# Patient Record
Sex: Male | Born: 1970 | Race: Black or African American | Hispanic: No | Marital: Married | Smoking: Current every day smoker
Health system: Southern US, Community
[De-identification: ages and names within clinical notes are randomized; demographics above are authoritative.]

## PROBLEM LIST (undated history)

## (undated) DIAGNOSIS — K219 Gastro-esophageal reflux disease without esophagitis: Secondary | ICD-10-CM

## (undated) DIAGNOSIS — I10 Essential (primary) hypertension: Secondary | ICD-10-CM

## (undated) DIAGNOSIS — L709 Acne, unspecified: Secondary | ICD-10-CM

## (undated) DIAGNOSIS — T7840XA Allergy, unspecified, initial encounter: Secondary | ICD-10-CM

## (undated) DIAGNOSIS — M1990 Unspecified osteoarthritis, unspecified site: Secondary | ICD-10-CM

## (undated) DIAGNOSIS — R011 Cardiac murmur, unspecified: Secondary | ICD-10-CM

## (undated) HISTORY — DX: Cardiac murmur, unspecified: R01.1

## (undated) HISTORY — PX: CATARACT EXTRACTION: SUR2

## (undated) HISTORY — PX: EYE SURGERY: SHX253

## (undated) HISTORY — DX: Gastro-esophageal reflux disease without esophagitis: K21.9

## (undated) HISTORY — DX: Acne, unspecified: L70.9

## (undated) HISTORY — DX: Essential (primary) hypertension: I10

## (undated) HISTORY — DX: Allergy, unspecified, initial encounter: T78.40XA

## (undated) HISTORY — DX: Unspecified osteoarthritis, unspecified site: M19.90

---

## 2000-12-16 ENCOUNTER — Encounter: Payer: Self-pay | Admitting: Emergency Medicine

## 2000-12-16 ENCOUNTER — Emergency Department (HOSPITAL_COMMUNITY): Admission: EM | Admit: 2000-12-16 | Discharge: 2000-12-16 | Payer: Self-pay | Admitting: Emergency Medicine

## 2008-03-27 ENCOUNTER — Ambulatory Visit: Payer: Self-pay | Admitting: Internal Medicine

## 2008-03-27 DIAGNOSIS — R011 Cardiac murmur, unspecified: Secondary | ICD-10-CM | POA: Insufficient documentation

## 2008-03-27 DIAGNOSIS — IMO0001 Reserved for inherently not codable concepts without codable children: Secondary | ICD-10-CM | POA: Insufficient documentation

## 2008-03-27 DIAGNOSIS — E1165 Type 2 diabetes mellitus with hyperglycemia: Secondary | ICD-10-CM

## 2008-03-27 DIAGNOSIS — K219 Gastro-esophageal reflux disease without esophagitis: Secondary | ICD-10-CM | POA: Insufficient documentation

## 2008-04-05 ENCOUNTER — Telehealth: Payer: Self-pay | Admitting: Internal Medicine

## 2008-05-29 ENCOUNTER — Ambulatory Visit: Payer: Self-pay | Admitting: Internal Medicine

## 2008-05-29 LAB — CONVERTED CEMR LAB: Hgb A1c MFr Bld: 7.9 % — ABNORMAL HIGH (ref 4.6–6.0)

## 2008-06-06 ENCOUNTER — Ambulatory Visit: Payer: Self-pay | Admitting: Internal Medicine

## 2008-06-15 ENCOUNTER — Telehealth: Payer: Self-pay | Admitting: Family Medicine

## 2008-08-08 ENCOUNTER — Ambulatory Visit: Payer: Self-pay | Admitting: Internal Medicine

## 2008-08-08 LAB — CONVERTED CEMR LAB
ALT: 16 units/L (ref 0–53)
AST: 17 units/L (ref 0–37)
Albumin: 4.4 g/dL (ref 3.5–5.2)
Alkaline Phosphatase: 61 units/L (ref 39–117)
BUN: 11 mg/dL (ref 6–23)
Bilirubin, Direct: 0.2 mg/dL (ref 0.0–0.3)
CO2: 31 meq/L (ref 19–32)
Calcium: 9.6 mg/dL (ref 8.4–10.5)
Chloride: 101 meq/L (ref 96–112)
Creatinine, Ser: 0.9 mg/dL (ref 0.4–1.5)
GFR calc Af Amer: 121 mL/min
GFR calc non Af Amer: 100 mL/min
Glucose, Bld: 164 mg/dL — ABNORMAL HIGH (ref 70–99)
Hgb A1c MFr Bld: 8.8 % — ABNORMAL HIGH (ref 4.6–6.0)
Potassium: 4.2 meq/L (ref 3.5–5.1)
Sodium: 142 meq/L (ref 135–145)
Total Bilirubin: 1 mg/dL (ref 0.3–1.2)
Total Protein: 7.4 g/dL (ref 6.0–8.3)

## 2008-09-11 ENCOUNTER — Ambulatory Visit: Payer: Self-pay | Admitting: Internal Medicine

## 2008-11-14 ENCOUNTER — Ambulatory Visit: Payer: Self-pay | Admitting: Internal Medicine

## 2008-11-14 LAB — CONVERTED CEMR LAB
ALT: 16 units/L (ref 0–53)
AST: 21 units/L (ref 0–37)
Albumin: 4 g/dL (ref 3.5–5.2)
Alkaline Phosphatase: 57 units/L (ref 39–117)
BUN: 12 mg/dL (ref 6–23)
Bilirubin, Direct: 0.1 mg/dL (ref 0.0–0.3)
CO2: 30 meq/L (ref 19–32)
Calcium: 9.3 mg/dL (ref 8.4–10.5)
Chloride: 107 meq/L (ref 96–112)
Creatinine, Ser: 0.8 mg/dL (ref 0.4–1.5)
GFR calc non Af Amer: 138.29 mL/min (ref >60)
Glucose, Bld: 195 mg/dL — ABNORMAL HIGH (ref 70–99)
Hgb A1c MFr Bld: 7.4 % — ABNORMAL HIGH (ref 4.6–6.5)
Potassium: 4.1 meq/L (ref 3.5–5.1)
Sodium: 140 meq/L (ref 135–145)
Total Bilirubin: 0.7 mg/dL (ref 0.3–1.2)
Total Protein: 7.1 g/dL (ref 6.0–8.3)

## 2008-12-26 ENCOUNTER — Ambulatory Visit: Payer: Self-pay | Admitting: Internal Medicine

## 2009-05-08 ENCOUNTER — Ambulatory Visit: Payer: Self-pay | Admitting: Internal Medicine

## 2009-05-08 LAB — CONVERTED CEMR LAB: Hgb A1c MFr Bld: 7 % — ABNORMAL HIGH (ref 4.6–6.5)

## 2009-07-10 ENCOUNTER — Ambulatory Visit: Payer: Self-pay | Admitting: Internal Medicine

## 2009-07-10 DIAGNOSIS — I10 Essential (primary) hypertension: Secondary | ICD-10-CM | POA: Insufficient documentation

## 2009-07-10 LAB — HM DIABETES FOOT EXAM

## 2009-10-18 ENCOUNTER — Telehealth: Payer: Self-pay | Admitting: Internal Medicine

## 2009-11-13 ENCOUNTER — Telehealth: Payer: Self-pay | Admitting: Internal Medicine

## 2009-11-21 DIAGNOSIS — E291 Testicular hypofunction: Secondary | ICD-10-CM | POA: Insufficient documentation

## 2009-11-25 ENCOUNTER — Encounter: Payer: Self-pay | Admitting: Internal Medicine

## 2010-01-01 ENCOUNTER — Ambulatory Visit: Payer: Self-pay | Admitting: Internal Medicine

## 2010-01-01 LAB — CONVERTED CEMR LAB
ALT: 20 units/L (ref 0–53)
AST: 20 units/L (ref 0–37)
Albumin: 4.4 g/dL (ref 3.5–5.2)
Alkaline Phosphatase: 54 units/L (ref 39–117)
BUN: 11 mg/dL (ref 6–23)
Basophils Absolute: 0 10*3/uL (ref 0.0–0.1)
Basophils Relative: 0.3 % (ref 0.0–3.0)
Bilirubin Urine: NEGATIVE
Bilirubin, Direct: 0.2 mg/dL (ref 0.0–0.3)
CO2: 32 meq/L (ref 19–32)
Calcium: 10.2 mg/dL (ref 8.4–10.5)
Chloride: 103 meq/L (ref 96–112)
Cholesterol: 213 mg/dL — ABNORMAL HIGH (ref 0–200)
Creatinine, Ser: 0.9 mg/dL (ref 0.4–1.5)
Creatinine,U: 225.5 mg/dL
Direct LDL: 127.8 mg/dL
Eosinophils Absolute: 0.3 10*3/uL (ref 0.0–0.7)
Eosinophils Relative: 4.5 % (ref 0.0–5.0)
GFR calc non Af Amer: 114.14 mL/min (ref >60)
Glucose, Bld: 161 mg/dL — ABNORMAL HIGH (ref 70–99)
Glucose, Urine, Semiquant: NEGATIVE
HCT: 50.6 % (ref 39.0–52.0)
HDL: 48.1 mg/dL (ref >39.00)
Hemoglobin: 17.1 g/dL — ABNORMAL HIGH (ref 13.0–17.0)
Hgb A1c MFr Bld: 8.7 % — ABNORMAL HIGH (ref 4.6–6.5)
Ketones, urine, test strip: NEGATIVE
Lymphocytes Relative: 35.3 % (ref 12.0–46.0)
Lymphs Abs: 2.3 10*3/uL (ref 0.7–4.0)
MCHC: 33.8 g/dL (ref 30.0–36.0)
MCV: 88.9 fL (ref 78.0–100.0)
Microalb Creat Ratio: 1.2 mg/g (ref 0.0–30.0)
Microalb, Ur: 2.6 mg/dL — ABNORMAL HIGH (ref 0.0–1.9)
Monocytes Absolute: 0.5 10*3/uL (ref 0.1–1.0)
Monocytes Relative: 7.4 % (ref 3.0–12.0)
Neutro Abs: 3.4 10*3/uL (ref 1.4–7.7)
Neutrophils Relative %: 52.5 % (ref 43.0–77.0)
Nitrite: NEGATIVE
PSA: 0.53 ng/mL (ref 0.10–4.00)
Platelets: 299 10*3/uL (ref 150.0–400.0)
Potassium: 5.1 meq/L (ref 3.5–5.1)
RBC: 5.69 M/uL (ref 4.22–5.81)
RDW: 14.4 % (ref 11.5–14.6)
Sodium: 141 meq/L (ref 135–145)
Specific Gravity, Urine: >=1.03
TSH: 0.98 microintl units/mL (ref 0.35–5.50)
Total Bilirubin: 0.9 mg/dL (ref 0.3–1.2)
Total CHOL/HDL Ratio: 4
Total Protein: 7.5 g/dL (ref 6.0–8.3)
Triglycerides: 286 mg/dL — ABNORMAL HIGH (ref 0.0–149.0)
Urobilinogen, UA: 0.2
VLDL: 57.2 mg/dL — ABNORMAL HIGH (ref 0.0–40.0)
WBC Urine, dipstick: NEGATIVE
WBC: 6.5 10*3/uL (ref 4.5–10.5)
pH: 5.5

## 2010-01-08 ENCOUNTER — Ambulatory Visit: Payer: Self-pay | Admitting: Internal Medicine

## 2010-02-28 ENCOUNTER — Telehealth (INDEPENDENT_AMBULATORY_CARE_PROVIDER_SITE_OTHER): Payer: Self-pay | Admitting: *Deleted

## 2010-04-02 ENCOUNTER — Ambulatory Visit: Payer: Self-pay | Admitting: Internal Medicine

## 2010-04-02 LAB — CONVERTED CEMR LAB: Hgb A1c MFr Bld: 8.5 % — ABNORMAL HIGH

## 2010-05-07 ENCOUNTER — Ambulatory Visit: Payer: Self-pay | Admitting: Internal Medicine

## 2010-07-22 NOTE — Assessment & Plan Note (Signed)
Summary: 4 month fup//ccm/pt rsc/cjr   Vital Signs:  Patient profile:   40 year old male Height:      68.5 inches Weight:      202 pounds BMI:     30.38 Temp:     98.2 degrees F oral Pulse rate:   80 / minute Resp:     14 per minute BP sitting:   136 / 80  (left arm)  Vitals Entered By: Willy Eddy, LPN (July 10, 2009 10:10 AM) CC: roa labs, Type 2 diabetes mellitus follow-up   CC:  roa labs and Type 2 diabetes mellitus follow-up.  History of Present Illness: follow up visit for DM  Type 2 Diabetes Mellitus Follow-Up      This is a 40 year old man who presents for Type 2 diabetes mellitus follow-up.  family hx and weight are increased risks.  The patient denies polyuria, polydipsia, blurred vision, self managed hypoglycemia, hypoglycemia requiring help, weight loss, weight gain, and numbness of extremities.  The patient denies the following symptoms: neuropathic pain, chest pain, vomiting, orthostatic symptoms, poor wound healing, intermittent claudication, vision loss, and foot ulcer.  Since the last visit the patient reports poor dietary compliance.  The patient has been measuring capillary blood glucose before breakfast.  Since the last visit, the patient reports having had no eye care.    Preventive Screening-Counseling & Management  Alcohol-Tobacco     Smoking Status: current     Smoking Cessation Counseling: yes     Packs/Day: 1/2     Passive Smoke Exposure: yes  Problems Prior to Update: 1)  Encounter For Long-term Use of Other Medications  (ICD-V58.69) 2)  Hypertension, Moderate  (ICD-401.9) 3)  Family History Diabetes 1st Degree Relative  (ICD-V18.0) 4)  Heart Murmur  (ICD-785.2) 5)  Gerd  (ICD-530.81) 6)  Diabetes Mellitus, Type II  (ICD-250.00)  Medications Prior to Update: 1)  Metformin Hcl 1000 Mg Tabs (Metformin Hcl) .[name].. 1 Two Times A Day 2)  Amoxil 500 Mg Caps (Amoxicillin) .[name].. 1 Once Daily 3)  Prilosec 20 Mg Cpdr (Omeprazole) .[name].. 1 Once  Daily 4)  Glimepiride 4 Mg Tabs (Glimepiride) .... One By Mouth Daily 5)  Benazepril Hcl 10 Mg Tabs (Benazepril Hcl) .... One By Mouth Daily 6)  Onglyza 5 Mg Tabs (Saxagliptin Hcl) .... One By Mouth Day  Current Medications (verified): 1)  Amoxil 500 Mg Caps (Amoxicillin) .[name].. 1 Once Daily 2)  Prilosec 20 Mg Cpdr (Omeprazole) .[name].. 1 Once Daily 3)  Glimepiride 4 Mg Tabs (Glimepiride) .... One By Mouth Daily 4)  Benazepril Hcl 10 Mg Tabs (Benazepril Hcl) .... One By Mouth Daily 5)  Onglyza 5 Mg Tabs (Saxagliptin Hcl) .... One By Mouth Day 6)  Janumet 50-1000 Mg Tabs (Sitagliptin-Metformin Hcl) .[name].. 1 Two Times A Day  Allergies (verified): No Known Drug Allergies  Past History:  Family History: Last updated: 03/27/2008 Family History of Arthritis Family History Diabetes 1st degree relative Family History Hypertension Family History of Stroke F 1st degree relative <60  Social History: Last updated: 03/27/2008 Occupation:sales manager  Married Current Smoker Alcohol use-yes  Risk Factors: Smoking Status: current (07/10/2009) Packs/Day: 1/2 (07/10/2009) Passive Smoke Exposure: yes (07/10/2009)  Past medical, surgical, family and social histories (including risk factors) reviewed for relevance to current acute and chronic problems.  Past Medical History: Reviewed history from 03/27/2008 and no changes required. Diabetes mellitus, type II acne Heart murmur GERD Hypertension  Family History: Reviewed history from 03/27/2008 and no changes  required. Family History of Arthritis Family History Diabetes 1st degree relative Family History Hypertension Family History of Stroke F 1st degree relative <60  Social History: Reviewed history from 03/27/2008 and no changes required. Occupation:sales manager  Married Current Smoker Alcohol use-yes  Review of Systems  The patient denies anorexia, fever, weight loss, weight gain, vision loss, decreased hearing, hoarseness,  chest pain, syncope, dyspnea on exertion, peripheral edema, prolonged cough, headaches, hemoptysis, abdominal pain, melena, hematochezia, severe indigestion/heartburn, hematuria, incontinence, genital sores, muscle weakness, suspicious skin lesions, transient blindness, difficulty walking, depression, unusual weight change, abnormal bleeding, enlarged lymph nodes, angioedema, breast masses, and testicular masses.    Physical Exam  General:  Well-developed,well-nourished,in no acute distress; alert,appropriate and cooperative throughout examination Head:  Normocephalic and atraumatic without obvious abnormalities. No apparent alopecia or balding. Ears:  R ear normal and L ear normal.   Mouth:  good dentition and pharynx pink and moist.   Lungs:  normal respiratory effort and no wheezes.   Heart:  normal rate and regular rhythm.   Abdomen:  Bowel sounds positive,abdomen soft and non-tender without masses, organomegaly or hernias noted.  Diabetes Management Exam:    Foot Exam (with socks and/or shoes not present):       Sensory-Pinprick/Light touch:          Left medial foot (L-4): normal          Left dorsal foot (L-5): normal          Left lateral foot (S-1): normal          Right medial foot (L-4): normal          Right dorsal foot (L-5): normal          Right lateral foot (S-1): normal       Sensory-Monofilament:          Left foot: normal          Right foot: normal       Inspection:          Left foot: normal          Right foot: normal       Nails:          Left foot: normal          Right foot: normal   Impression & Recommendations:  Problem # 1:  DM (ICD-250.00) Assessment Deteriorated poor control with high risk of complications The following medications were removed from the medication list:    Metformin Hcl 1000 Mg Tabs (Metformin hcl) .[name]... 1 two times a day His updated medication list for this problem includes:    Glimepiride 4 Mg Tabs (Glimepiride) ..... One by  mouth daily    Benazepril Hcl 10 Mg Tabs (Benazepril hcl) ..... One by mouth daily    Onglyza 5 Mg Tabs (Saxagliptin hcl) ..... One by mouth day    Janumet 50-1000 Mg Tabs (Sitagliptin-metformin hcl) .[name]... 1 two times a day  Labs Reviewed: Creat: 0.8 (11/14/2008)    Reviewed HgBA1c results: 7.0 (05/08/2009)  7.4 (11/14/2008)  Problem # 2:  HYPERTENSION (ICD-401) Assessment: Improved fair control and  now at goal His updated medication list for this problem includes:    Benazepril Hcl 10 Mg Tabs (Benazepril hcl) ..... One by mouth daily  BP today: 136/80 Prior BP: 130/82 (12/26/2008)  Prior 10 Yr Risk Heart Disease: Not enough information (03/27/2008)  Labs Reviewed: K+: 4.1 (11/14/2008) Creat: : 0.8 (11/14/2008)     Problem # 3:  HEART MURMUR (  ICD-785.2)  monitering no change  EKG obtained (see interpretation); Will refer to cardiology for evaluation of this murmur.   Problem # 4:  GERD (ICD-530.81) Assessment: Unchanged  His updated medication list for this problem includes:    Prilosec 20 Mg Cpdr (Omeprazole) .[name]... 1 once daily  Complete Medication List: 1)  Amoxil 500 Mg Caps (Amoxicillin) .[name].. 1 once daily 2)  Prilosec 20 Mg Cpdr (Omeprazole) .[name].. 1 once daily 3)  Glimepiride 4 Mg Tabs (Glimepiride) .... One by mouth daily 4)  Benazepril Hcl 10 Mg Tabs (Benazepril hcl) .... One by mouth daily 5)  Onglyza 5 Mg Tabs (Saxagliptin hcl) .... One by mouth day 6)  Janumet 50-1000 Mg Tabs (Sitagliptin-metformin hcl) .[name].. 1 two times a day  Patient Instructions: 1)  Please schedule a follow-up appointment in 6 months.for CPX add A1C to labs

## 2010-07-22 NOTE — Progress Notes (Signed)
Summary: [facility] 9-9  Phone Note Call from Patient   Summary of Call: Needs test strip Rx called in.  Will call back with specific name needed.    Initial call taken by: Rudy Jew, RN,  February 28, 2010 3:34 PM

## 2010-07-22 NOTE — Medication Information (Signed)
Summary: Denial of Coverage for Testosterone/[employer]BS  Denial of Coverage for Testosterone/[employer]BS   Imported By: Maryln Gottron 11/27/2009 15:42:01  _____________________________________________________________________  External Attachment:    Type:   Image     Comment:   External Document

## 2010-07-22 NOTE — Progress Notes (Signed)
Summary: Pt req script for Androgel  Phone Note Call from Patient Call back at Home Phone [phone]   Caller: Patient Summary of Call: Pt req refill of a testoterone med called Androgel. Pt used to get the from Dr. Lucianne Muss, but no longer see that Dr. Rock Nephew would like this called in to [facility] on [address]. Pt has CPX sch in July.  Initial call taken by: Lucy Antigua,  October 18, 2009 2:18 PM    New/Updated Medications: ANDROGEL PUMP 1 % GEL (TESTOSTERONE) 4 pumps transdermal each day Prescriptions: ANDROGEL PUMP 1 % GEL (TESTOSTERONE) 4 pumps transdermal each day  #150 x 0   Entered by:   Willy Eddy, LPN   Authorized by:   Stacie Glaze MD   Signed by:   Willy Eddy, LPN on 30/86/5784   Method used:   Telephoned to ...       [facility] (retail)       [address]       [place], [place]  [address]       Ph: [phone]       Fax: [phone]   RxID:   [id number]   Appended Document: Pt req script for Androgel faxed refill request- ok per dr Lovell Sheehan

## 2010-07-22 NOTE — Progress Notes (Signed)
Summary: lost discount card  Phone Note Call from Patient Call back at [facility] Phone [phone]   Summary of Call: Misplaced discount card for Janumet? Is another available.   Initial call taken by: Rudy Jew, RN,  Nov 13, 2009 1:32 PM  Follow-up for Phone Call        out front  for pt to pick up Follow-up by: Willy Eddy, LPN,  Nov 13, 2009 5:23 PM

## 2010-07-22 NOTE — Assessment & Plan Note (Signed)
Summary: cpx/njr   Vital Signs:  Patient profile:   40 year old male Height:      68.5 inches Weight:      200 pounds BMI:     30.08 Temp:     98.2 degrees F oral Pulse rate:   76 / minute Resp:     14 per minute BP sitting:   136 / 88  (left arm)  Vitals Entered By: Willy Eddy, LPN (January 08, 2010 11:07 AM) CC: cpx Is Patient Diabetic? Yes Did you bring your meter with you today? No   CC:  cpx.  History of Present Illness: DM is in poor control has not been able to exercize he is taking the medication twice daily as directed   The pt was asked about all immunizations, health maint. services that are appropriate to their age and was given guidance on diet exercize  and weight management   Preventive Screening-Counseling & Management  Alcohol-Tobacco     Smoking Status: current     Smoking Cessation Counseling: yes     Packs/Day: 1/2     Passive Smoke Exposure: yes  Problems Prior to Update: 1)  Hypogonadism  (ICD-257.2) 2)  Encounter For Long-term Use of Other Medications  (ICD-V58.69) 3)  Hypertension, Moderate  (ICD-401.9) 4)  Family History Diabetes 1st Degree Relative  (ICD-V18.0) 5)  Heart Murmur  (ICD-785.2) 6)  Gerd  (ICD-530.81) 7)  Diabetes Mellitus, Type II  (ICD-250.00)  Medications Prior to Update: 1)  Amoxil 500 Mg Caps (Amoxicillin) .[name].. 1 Once Daily 2)  Prilosec 20 Mg Cpdr (Omeprazole) .[name].. 1 Once Daily 3)  Glimepiride 4 Mg Tabs (Glimepiride) .... One By Mouth Daily 4)  Benazepril Hcl 10 Mg Tabs (Benazepril Hcl) .... One By Mouth Daily 5)  Onglyza 5 Mg Tabs (Saxagliptin Hcl) .... One By Mouth Day 6)  Janumet 50-1000 Mg Tabs (Sitagliptin-Metformin Hcl) .[name].. 1 Two Times A Day 7)  Androgel Pump 1 % Gel (Testosterone) .... 4 Pumps Transdermal Each Day  Current Medications (verified): 1)  Amoxil 500 Mg Caps (Amoxicillin) .[name].. 1 Once Daily 2)  Prilosec 20 Mg Cpdr (Omeprazole) .[name].. 1 Once Daily 3)  Glimepiride 4 Mg Tabs (Glimepiride) .... One  By Mouth Daily 4)  Benazepril Hcl 10 Mg Tabs (Benazepril Hcl) .... One By Mouth Daily 5)  Onglyza 5 Mg Tabs (Saxagliptin Hcl) .... One By Mouth Day 6)  Janumet 50-1000 Mg Tabs (Sitagliptin-Metformin Hcl) .[name].. 1 Two Times A Day 7)  Androgel Pump 1 % Gel (Testosterone) .... 4 Pumps Transdermal Each Day  Allergies (verified): No Known Drug Allergies  Past History:  Family History: Last updated: 03/27/2008 Family History of Arthritis Family History Diabetes 1st degree relative Family History Hypertension Family History of Stroke F 1st degree relative <60  Social History: Last updated: 03/27/2008 Occupation:sales manager  Married Current Smoker Alcohol use-yes  Risk Factors: Smoking Status: current (01/08/2010) Packs/Day: 1/2 (01/08/2010) Passive Smoke Exposure: yes (01/08/2010)  Past medical, surgical, family and social histories (including risk factors) reviewed, and no changes noted (except as noted below).  Past Medical History: Reviewed history from 03/27/2008 and no changes required. Diabetes mellitus, type II acne Heart murmur GERD Hypertension  Family History: Reviewed history from 03/27/2008 and no changes required. Family History of Arthritis Family History Diabetes 1st degree relative Family History Hypertension Family History of Stroke F 1st degree relative <60  Social History: Reviewed history from 03/27/2008 and no changes required. Occupation:sales manager  Married Current Smoker Alcohol use-yes  Review of Systems  The patient denies anorexia, fever, weight loss, weight gain, vision loss, decreased hearing, hoarseness, chest pain, syncope, dyspnea on exertion, peripheral edema, prolonged cough, headaches, hemoptysis, abdominal pain, melena, hematochezia, severe indigestion/heartburn, hematuria, incontinence, genital sores, muscle weakness, suspicious skin lesions, transient blindness, difficulty walking, depression, unusual weight change, abnormal  bleeding, enlarged lymph nodes, angioedema, and breast masses.    Physical Exam  General:  Well-developed,well-nourished,in no acute distress; alert,appropriate and cooperative throughout examination Head:  Normocephalic and atraumatic without obvious abnormalities. No apparent alopecia or balding. Eyes:  pupils equal, pupils round, and pupils reactive to light.   Ears:  R ear normal and L ear normal.   Nose:  no external deformity and no nasal discharge.   Lungs:  normal respiratory effort and no wheezes.   Heart:  normal rate and regular rhythm.   Abdomen:  Bowel sounds positive,abdomen soft and non-tender without masses, organomegaly or hernias noted. Msk:  No deformity or scoliosis noted of thoracic or lumbar spine.   Pulses:  R and L carotid,radial,femoral,dorsalis pedis and posterior tibial pulses are full and equal bilaterally Extremities:  No clubbing, cyanosis, edema, or deformity noted with normal full range of motion of all joints.   Neurologic:  No cranial nerve deficits noted. Station and gait are normal. Plantar reflexes are down-going bilaterally. DTRs are symmetrical throughout. Sensory, motor and coordinative functions appear intact. Cervical Nodes:  No lymphadenopathy noted Axillary Nodes:  No palpable lymphadenopathy Psych:  Cognition and judgment appear intact. Alert and cooperative with normal attention span and concentration. No apparent delusions, illusions, hallucinations   Impression & Recommendations:  Problem # 1:  DIABETES MELLITUS, TYPE II (ICD-250.00)  The following medications were removed from the medication list:    Janumet 50-1000 Mg Tabs (Sitagliptin-metformin hcl) .[name]... 1 two times a day His updated medication list for this problem includes:    Glyburide-metformin 5-500 Mg Tabs (Glyburide-metformin) ..... One by mouth two times a day    Benazepril Hcl 10 Mg Tabs (Benazepril hcl) ..... One by mouth daily    Onglyza 5 Mg Tabs (Saxagliptin hcl) ..... One  by mouth day  Labs Reviewed: Creat: 0.9 (01/01/2010)    Reviewed HgBA1c results: 8.7 (01/01/2010)  7.0 (05/08/2009)  Problem # 2:  Preventive Health Care (ICD-V70.0) The pt was asked about all immunizations, health maint. services that are appropriate to their age and was given guidance on diet exercize  and weight management  Td Booster: Historical (03/22/2008)   Pneumovax: Pneumovax (06/06/2008) Chol: 213 (01/01/2010)   HDL: 48.10 (01/01/2010)   TG: 286.0 (01/01/2010) TSH: 0.98 (01/01/2010)   HgbA1C: 8.7 (01/01/2010)   PSA: 0.53 (01/01/2010)  Discussed using sunscreen, use of alcohol, drug use, self testicular exam, routine dental care, routine eye care, routine physical exam, seat belts, multiple vitamins, osteoporosis prevention, adequate calcium intake in diet, and recommendations for immunizations.  Discussed exercise and checking cholesterol.  Discussed gun safety, safe sex, and contraception. Also recommend checking PSA.  Complete Medication List: 1)  Amoxil 500 Mg Caps (Amoxicillin) .[name].. 1 once daily 2)  Prilosec 20 Mg Cpdr (Omeprazole) .[name].. 1 once daily 3)  Glyburide-metformin 5-500 Mg Tabs (Glyburide-metformin) .... One by mouth two times a day 4)  Benazepril Hcl 10 Mg Tabs (Benazepril hcl) .... One by mouth daily 5)  Onglyza 5 Mg Tabs (Saxagliptin hcl) .... One by mouth day 6)  Androgel Pump 1 % Gel (Testosterone) .... 4 pumps transdermal each day  Other Orders: EKG w/ Interpretation (93000)  Patient  Instructions: 1)  Please schedule a follow-up appointment in 3 months. 2)  HbgA1C prior to visit, ICD-9:250.00 Prescriptions: GLYBURIDE-METFORMIN 5-500 MG TABS (GLYBURIDE-METFORMIN) one by mouth two times a day  #60 x 11   Entered and Authorized by:   Stacie Glaze MD   Signed by:   Stacie Glaze MD on 01/08/2010   Method used:   Electronically to        [facility] ([facility]ail)       [address]       [place], [place]  [address]        Ph: [phone]       Fax: [phone]   RxID:   [id number] ONGLYZA 5 MG TABS (SAXAGLIPTIN HCL) one by mouth day  #30 x 11   Entered by:   Willy Eddy, LPN   Authorized by:   Stacie Glaze MD   Signed by:   Willy Eddy, LPN on 72/53/6644   Method used:   Electronically to        [facility] (retail)       [address]       [place], [place]  [address]       Ph: [phone]       Fax: [phone]   RxID:   [id number] GLIMEPIRIDE 4 MG TABS (GLIMEPIRIDE) one by mouth daily  #30 x 6   Entered by:   Willy Eddy, LPN   Authorized by:   Stacie Glaze MD   Signed by:   Willy Eddy, LPN on 55/73/2202   Method used:   Electronically to        [facility] (retail)       [address]       [place], [place]  [address]       Ph: [phone]       Fax: [phone]   RxID:   [id number]

## 2010-07-22 NOTE — Assessment & Plan Note (Signed)
Summary: 3 month rov/njr/pt rsc/cjr   Vital Signs:  Patient profile:   40 year old male Height:      68.5 inches Weight:      204 pounds BMI:     30.68 Temp:     98.2 degrees F oral Pulse rate:   76 / minute Resp:     14 per minute BP sitting:   140 / 80  (left arm)  Vitals Entered By: Willy Eddy, LPN (May 07, 2010 3:56 PM) CC: [facility], Hypertension Management Is Patient Diabetic? Yes Did you bring your meter with you today? No   [facility] President:  Stacie Glaze MD  CC:  [facility] and Hypertension Management.  History of Present Illness: the pt has significant risk from diet and npon adherance he was of the mind set that he could eat what  he wanted and 'exercize" he has noted very labile readings  Hypertension History:      He denies headache, chest pain, palpitations, dyspnea with exertion, orthopnea, PND, peripheral edema, visual symptoms, neurologic problems, syncope, and side effects from treatment.        Positive major cardiovascular risk factors include diabetes, hypertension, family history for ischemic heart disease (males less than 43 years old), and current tobacco user.  Negative major cardiovascular risk factors include male age less than 37 years old.     Preventive Screening-Counseling & Management  Alcohol-Tobacco     Smoking Status: current     Smoking Cessation Counseling: yes     Packs/Day: 1/2     Passive Smoke Exposure: yes     Tobacco Counseling: to quit use of tobacco products  Problems Prior to Update: 1)  Preventive Health Care  (ICD-V70.0) 2)  Hypogonadism  (ICD-257.2) 3)  Encounter For Long-term Use of Other Medications  (ICD-V58.69) 4)  Hypertension, Moderate  (ICD-401.9) 5)  Family History Diabetes 1st Degree Relative  (ICD-V18.0) 6)  Heart Murmur  (ICD-785.2) 7)  Gerd  (ICD-530.81) 8)  Diabetes Mellitus, Type II  (ICD-250.00)  Medications Prior to Update: 1)  Amoxil 500 Mg Caps (Amoxicillin) .[name].. 1 Once Daily 2)   Prilosec 20 Mg Cpdr (Omeprazole) .[name].. 1 Once Daily 3)  Glyburide-Metformin 5-500 Mg Tabs (Glyburide-Metformin) .... One By Mouth Two Times A Day 4)  Benazepril Hcl 10 Mg Tabs (Benazepril Hcl) .... One By Mouth Daily 5)  Onglyza 5 Mg Tabs (Saxagliptin Hcl) .... One By Mouth Day 6)  Androgel Pump 1 % Gel (Testosterone) .... 4 Pumps Transdermal Each Day 7)  Onetouch Test  Strp (Glucose Blood) .[name].. 1 Once Daily  Current Medications (verified): 1)  Amoxil 500 Mg Caps (Amoxicillin) .[name].. 1 Once Daily 2)  Prilosec 20 Mg Cpdr (Omeprazole) .[name].. 1 Once Daily 3)  Glyburide-Metformin 5-500 Mg Tabs (Glyburide-Metformin) .... One By Mouth Two Times A Day 4)  Benazepril Hcl 10 Mg Tabs (Benazepril Hcl) .... One By Mouth Daily 5)  Onglyza 5 Mg Tabs (Saxagliptin Hcl) .... One By Mouth Day 6)  Androgel Pump 1 % Gel (Testosterone) .... 4 Pumps Transdermal Each Day 7)  Onetouch Test  Strp (Glucose Blood) .[name].. 1 Once Daily  Allergies (verified): No Known Drug Allergies  Past History:  Family History: Last updated: 03/27/2008 Family History of Arthritis Family History Diabetes 1st degree relative Family History Hypertension Family History of Stroke F 1st degree relative <60  Social History: Last updated: 03/27/2008 Occupation:sales manager  Married Current Smoker Alcohol use-yes  Risk Factors: Smoking Status: current (05/07/2010) Packs/Day:  1/2 (05/07/2010) Passive Smoke Exposure: yes (05/07/2010)  Past medical, surgical, family and social histories (including risk factors) reviewed, and no changes noted (except as noted below).  Past Medical History: Reviewed history from 03/27/2008 and no changes required. Diabetes mellitus, type II acne Heart murmur GERD Hypertension  Family History: Reviewed history from 03/27/2008 and no changes required. Family History of Arthritis Family History Diabetes 1st degree relative Family History Hypertension Family History of Stroke F 1st degree  relative <60  Social History: Reviewed history from 03/27/2008 and no changes required. Occupation:sales manager  Married Current Smoker Alcohol use-yes  Review of Systems  The patient denies anorexia, fever, weight loss, weight gain, vision loss, decreased hearing, hoarseness, chest pain, syncope, dyspnea on exertion, peripheral edema, prolonged cough, headaches, hemoptysis, abdominal pain, melena, hematochezia, severe indigestion/heartburn, hematuria, incontinence, genital sores, muscle weakness, suspicious skin lesions, transient blindness, difficulty walking, depression, unusual weight change, abnormal bleeding, enlarged lymph nodes, angioedema, and breast masses.    Physical Exam  General:  Well-developed,well-nourished,in no acute distress; alert,appropriate and cooperative throughout examination Head:  Normocephalic and atraumatic without obvious abnormalities. No apparent alopecia or balding. Eyes:  pupils equal, pupils round, and pupils reactive to light.   Ears:  R ear normal and L ear normal.   Nose:  no external deformity and no nasal discharge.   Lungs:  normal respiratory effort and no wheezes.   Heart:  normal rate and regular rhythm.   Abdomen:  Bowel sounds positive,abdomen soft and non-tender without masses, organomegaly or hernias noted.   Impression & Recommendations:  Problem # 1:  HYPERTENSION, MODERATE (ICD-401.9) Assessment Deteriorated  His updated medication list for this problem includes:    Benazepril Hcl 10 Mg Tabs (Benazepril hcl) ..... One by mouth daily  BP today: 140/80 Prior BP: 136/88 (01/08/2010)  Prior 10 Yr Risk Heart Disease: Not enough information (03/27/2008)  Labs Reviewed: K+: 5.1 (01/01/2010) Creat: : 0.9 (01/01/2010)   Chol: 213 (01/01/2010)   HDL: 48.10 (01/01/2010)   TG: 286.0 (01/01/2010)  Problem # 2:  DIABETES MELLITUS, TYPE II (ICD-250.00) Assessment: Deteriorated yreviewe diet and this is the kep His updated medication  list for this problem includes:    Glyburide-metformin 5-500 Mg Tabs (Glyburide-metformin) ..... One by mouth two times a day    Benazepril Hcl 10 Mg Tabs (Benazepril hcl) ..... One by mouth daily    Onglyza 5 Mg Tabs (Saxagliptin hcl) ..... One by mouth day  Labs Reviewed: Creat: 0.9 (01/01/2010)    Reviewed HgBA1c results: 8.5 (04/02/2010)  8.7 (01/01/2010)  Complete Medication List: 1)  Amoxil 500 Mg Caps (Amoxicillin) .[name].. 1 once daily 2)  Prilosec 20 Mg Cpdr (Omeprazole) .[name].. 1 once daily 3)  Glyburide-metformin 5-500 Mg Tabs (Glyburide-metformin) .... One by mouth two times a day 4)  Benazepril Hcl 10 Mg Tabs (Benazepril hcl) .... One by mouth daily 5)  Onglyza 5 Mg Tabs (Saxagliptin hcl) .... One by mouth day 6)  Androgel Pump 1 % Gel (Testosterone) .... 4 pumps transdermal each day 7)  Onetouch Test Strp (Glucose blood) .[name].. 1 once daily  Hypertension Assessment/Plan:      The patient's hypertensive risk group is category C: Target organ damage and/or diabetes.  Today's blood pressure is 140/80.  His blood pressure goal is < 130/80.  Patient Instructions: 1)  Please schedule a follow-up appointment in 3 months. 2)  HbgA1C prior to visit, ICD-9: 250.00   Orders Added: 1)  Est. Patient Level IV [14782]

## 2010-08-20 ENCOUNTER — Other Ambulatory Visit (INDEPENDENT_AMBULATORY_CARE_PROVIDER_SITE_OTHER): Payer: Self-pay | Admitting: Internal Medicine

## 2010-08-20 DIAGNOSIS — E119 Type 2 diabetes mellitus without complications: Secondary | ICD-10-CM

## 2010-08-20 LAB — HEMOGLOBIN A1C: Hgb A1c MFr Bld: 8.6 % — ABNORMAL HIGH (ref 4.6–6.5)

## 2010-08-26 ENCOUNTER — Encounter: Payer: Self-pay | Admitting: Internal Medicine

## 2010-08-27 ENCOUNTER — Encounter: Payer: Self-pay | Admitting: Internal Medicine

## 2010-08-27 ENCOUNTER — Ambulatory Visit (INDEPENDENT_AMBULATORY_CARE_PROVIDER_SITE_OTHER): Payer: BC Managed Care – PPO | Admitting: Internal Medicine

## 2010-08-27 DIAGNOSIS — I10 Essential (primary) hypertension: Secondary | ICD-10-CM

## 2010-08-27 DIAGNOSIS — E119 Type 2 diabetes mellitus without complications: Secondary | ICD-10-CM

## 2010-08-27 MED ORDER — SAXAGLIPTIN-METFORMIN ER 2.5-1000 MG PO TB24: 2.0000 | ORAL_TABLET | Freq: Every day | ORAL | Status: DC

## 2010-08-27 MED ORDER — GLIMEPIRIDE 4 MG PO TABS: 4.0000 mg | ORAL_TABLET | Freq: Every day | ORAL | Status: DC

## 2010-08-27 NOTE — Assessment & Plan Note (Signed)
Full diabetes despite 3 drug therapies but he has a strong family history of problems with diabetes.   He states that he believes that stress at work causes his blood sugars to go up he has changed his diet recently and his blood sugars have improved some. We stressed the need for full diabetic control given his family history the fact that he is starting to show changes in his renal panel and that his A1c's have been consistently in the 80 range which puts him at risk for all of the common diabetic complications we will increase the metformin component to 2000 mg a day and change the sulfonylurea drugs.   Our target goal for him his fasting blood sugars and a 100-110 range.  I have spent more than 30 minutes examining this patient face-to-face of which over half was spent in counseling

## 2010-08-27 NOTE — Progress Notes (Signed)
  Subjective:    Patient ID: Gabriel Hamilton, male    DOB: 09/29/1970, 40 y.o.   MRN: [id number]  HPI  This 40 year old non-insulin-dependent diabetic postop family history of diabetic complications and his family and failure to reach goal A1c down for over one year his current A1c remains 8/2 or greater his fasting blood sugars are averaging at home in the 150-160 range although he has recently changed his diet he has not seen a drop in his CBGs as one might expect he admits to some stress eating and increased stress in his life and he but he has been compliant with his medications.  He denies any chest pain shortness of breath PND orthopnea any visual changes or GU problems   Review of Systems  Constitutional: Negative for fever and fatigue.  HENT: Negative for hearing loss, congestion, neck pain and postnasal drip.   Eyes: Negative for discharge, redness and visual disturbance.  Respiratory: Negative for cough, shortness of breath and wheezing.   Cardiovascular: Negative for leg swelling.  Gastrointestinal: Negative for abdominal pain, constipation and abdominal distention.  Genitourinary: Negative for urgency and frequency.  Musculoskeletal: Negative for joint swelling and arthralgias.  Skin: Negative for color change and rash.  Neurological: Negative for weakness and light-headedness.  Hematological: Negative for adenopathy.  Psychiatric/Behavioral: Negative for behavioral problems.       Objective:   Physical Exam  Constitutional: He is oriented to person, place, and time. He appears well-developed and well-nourished.  HENT:  Head: Normocephalic and atraumatic.  Eyes: Conjunctivae are normal. Pupils are equal, round, and reactive to light.  Neck: Normal range of motion. Neck supple.  Cardiovascular: Normal rate and regular rhythm.   Pulmonary/Chest: Effort normal and breath sounds normal.  Abdominal: Soft. Bowel sounds are normal.  Musculoskeletal: Normal range of motion.    Neurological: He is alert and oriented to person, place, and time.  Skin: Skin is warm and dry.   Diabetic foot exam:  Left: Reflexes 3+   Vibratory sensation normal  Proprioception normal  Sharp/dull discrimination normal  Filament test present Right: Reflexes 3+   Vibratory sensation normal  Proprioception normal  Sharp/dull discrimination normal  Filament test present        Assessment & Plan:   blood pressure is stable GERD is controlled with Prilosec the main issue is his diabetes per the problem focused exam

## 2010-11-19 ENCOUNTER — Other Ambulatory Visit (INDEPENDENT_AMBULATORY_CARE_PROVIDER_SITE_OTHER): Payer: BC Managed Care – PPO

## 2010-11-19 DIAGNOSIS — E119 Type 2 diabetes mellitus without complications: Secondary | ICD-10-CM

## 2010-11-19 LAB — BASIC METABOLIC PANEL
CO2: 29 mEq/L (ref 19–32)
GFR: 136.89 mL/min (ref >60.00)
Glucose, Bld: 109 mg/dL — ABNORMAL HIGH (ref 70–99)
Potassium: 4.7 mEq/L (ref 3.5–5.1)
Sodium: 140 mEq/L (ref 135–145)

## 2010-11-26 ENCOUNTER — Encounter: Payer: Self-pay | Admitting: Internal Medicine

## 2010-11-26 ENCOUNTER — Ambulatory Visit (INDEPENDENT_AMBULATORY_CARE_PROVIDER_SITE_OTHER): Payer: BC Managed Care – PPO | Admitting: Internal Medicine

## 2010-11-26 VITALS — BP 140/90 | HR 88 | Temp 98.2°F | Resp 16 | Ht 68.5 in | Wt 196.0 lb

## 2010-11-26 DIAGNOSIS — I10 Essential (primary) hypertension: Secondary | ICD-10-CM

## 2010-11-26 DIAGNOSIS — M25529 Pain in unspecified elbow: Secondary | ICD-10-CM

## 2010-11-26 DIAGNOSIS — E119 Type 2 diabetes mellitus without complications: Secondary | ICD-10-CM

## 2010-11-26 DIAGNOSIS — K219 Gastro-esophageal reflux disease without esophagitis: Secondary | ICD-10-CM

## 2010-11-26 NOTE — Progress Notes (Signed)
  Subjective:    Patient ID: Gabriel Hamilton, male    DOB: 1971-06-19, 40 y.o.   MRN: [id number]  HPI Doing well for follow up of DM and renal function He report intermittent night sweats He states that his CBGs in the late evening are in the 90s he is active during the day and has not been using an afternoon snack or night snack. The patient's reflux has been well controlled with over-the-counter Prilosec blood pressure has been stable his foot pain has improved with weight loss    Review of Systems  Constitutional: Negative for fever and fatigue.  HENT: Negative for hearing loss, congestion, neck pain and postnasal drip.   Eyes: Negative for discharge, redness and visual disturbance.  Respiratory: Negative for cough, shortness of breath and wheezing.   Cardiovascular: Negative for leg swelling.  Gastrointestinal: Negative for abdominal pain, constipation and abdominal distention.  Genitourinary: Negative for urgency and frequency.  Musculoskeletal: Negative for joint swelling and arthralgias.  Skin: Negative for color change and rash.  Neurological: Negative for weakness and light-headedness.  Hematological: Negative for adenopathy.  Psychiatric/Behavioral: Negative for behavioral problems.   Past Medical History  Diagnosis Date  . Diabetes mellitus   . Acne   . GERD (gastroesophageal reflux disease)   . Hypertension   . Heart murmur    History reviewed. No pertinent past surgical history.  reports that he has been smoking Cigarettes.  He has been smoking about .3 packs per day. He does not have any smokeless tobacco history on file. He reports that he drinks alcohol. He reports that he does not use illicit drugs. family history includes Arthritis in his mother; Diabetes in his father; Heart disease in his father; Hyperlipidemia in his father; and Hypertension in his father. No Known Allergies     Objective:   Physical Exam  Constitutional: He appears well-developed and  well-nourished.  HENT:  Head: Normocephalic and atraumatic.  Eyes: Conjunctivae are normal. Pupils are equal, round, and reactive to light.  Neck: Normal range of motion. Neck supple.  Cardiovascular: Normal rate and regular rhythm.   Pulmonary/Chest: Effort normal and breath sounds normal.  Abdominal: Soft. Bowel sounds are normal.   MSK exam no masses of weakness detected Moderated tenderness in epicondyles         Assessment & Plan:  The epicondylitis is most probably from arm position during the night he admits to sleeping on folded arms.  Diabetes is much better improved with a drop of hemoglobin A1c of greater than point we urged him to continue his diet and exercise plan but to add a high-protein snack before bedtime and perhaps one in the afternoon  complete physical examination in 4-5 months

## 2010-11-26 NOTE — Patient Instructions (Signed)
You need to start a light protein snack before bed this can be a 1 ounce wedge of cheese 10 almonds tablespoon of peanut butter on crackers or apple wedges It should not be fruits such as grapes or strawberries as this will actually cause more hypoglycemic Small yogurt ( [place])

## 2011-01-26 ENCOUNTER — Other Ambulatory Visit: Payer: Self-pay | Admitting: Internal Medicine

## 2011-01-29 ENCOUNTER — Other Ambulatory Visit: Payer: Self-pay | Admitting: Internal Medicine

## 2011-01-29 MED ORDER — GLUCOSE BLOOD VI STRP: ORAL_STRIP | Status: DC

## 2011-01-29 NOTE — Telephone Encounter (Signed)
Pt req refill of AccuChek Aviva Plus test strips #50. Pls call in to [facility] on [address]

## 2011-01-29 NOTE — Telephone Encounter (Signed)
Sent in

## 2011-03-25 ENCOUNTER — Encounter: Payer: BC Managed Care – PPO | Admitting: Internal Medicine

## 2011-04-17 ENCOUNTER — Encounter: Payer: BC Managed Care – PPO | Admitting: Internal Medicine

## 2011-05-18 ENCOUNTER — Other Ambulatory Visit: Payer: Self-pay | Admitting: Internal Medicine

## 2011-05-18 NOTE — Telephone Encounter (Signed)
Unable to send e script- called in for refilled #60 with 11 refills

## 2011-05-18 NOTE — Telephone Encounter (Signed)
Pt is out of kombiglyze xr 2.5-1000mg  call [facility] [address] [phone].

## 2011-07-15 ENCOUNTER — Ambulatory Visit (INDEPENDENT_AMBULATORY_CARE_PROVIDER_SITE_OTHER): Payer: BC Managed Care – PPO | Admitting: Internal Medicine

## 2011-07-15 ENCOUNTER — Encounter: Payer: Self-pay | Admitting: Internal Medicine

## 2011-07-15 VITALS — BP 130/80 | HR 84 | Temp 98.1°F | Resp 16 | Ht 68.5 in | Wt 200.0 lb

## 2011-07-15 DIAGNOSIS — Z0000 Encounter for general adult medical examination without abnormal findings: Secondary | ICD-10-CM

## 2011-07-15 DIAGNOSIS — E1165 Type 2 diabetes mellitus with hyperglycemia: Secondary | ICD-10-CM

## 2011-07-15 DIAGNOSIS — I10 Essential (primary) hypertension: Secondary | ICD-10-CM

## 2011-07-15 DIAGNOSIS — E1169 Type 2 diabetes mellitus with other specified complication: Secondary | ICD-10-CM

## 2011-07-15 LAB — CBC WITH DIFFERENTIAL/PLATELET
Basophils Relative: 0.1 % (ref 0.0–3.0)
Eosinophils Relative: 4.7 % (ref 0.0–5.0)
Lymphocytes Relative: 25.5 % (ref 12.0–46.0)
MCV: 91.9 fl (ref 78.0–100.0)
Monocytes Relative: 6.6 % (ref 3.0–12.0)
Neutrophils Relative %: 63.1 % (ref 43.0–77.0)
RBC: 6.01 Mil/uL — ABNORMAL HIGH (ref 4.22–5.81)
WBC: 7.2 10*3/uL (ref 4.5–10.5)

## 2011-07-15 LAB — HEMOGLOBIN A1C: Hgb A1c MFr Bld: 7.9 % — ABNORMAL HIGH (ref 4.6–6.5)

## 2011-07-15 LAB — BASIC METABOLIC PANEL
CO2: 31 mEq/L (ref 19–32)
Calcium: 9.3 mg/dL (ref 8.4–10.5)
Sodium: 139 mEq/L (ref 135–145)

## 2011-07-15 LAB — HEPATIC FUNCTION PANEL
Alkaline Phosphatase: 65 U/L (ref 39–117)
Bilirubin, Direct: 0.2 mg/dL (ref 0.0–0.3)
Total Bilirubin: 1.2 mg/dL (ref 0.3–1.2)
Total Protein: 7.6 g/dL (ref 6.0–8.3)

## 2011-07-15 LAB — LIPID PANEL
HDL: 43.4 mg/dL (ref >39.00)
Total CHOL/HDL Ratio: 4
VLDL: 44.8 mg/dL — ABNORMAL HIGH (ref 0.0–40.0)

## 2011-07-15 LAB — POCT URINALYSIS DIPSTICK
Glucose, UA: NEGATIVE
Nitrite, UA: NEGATIVE
Urobilinogen, UA: 0.2

## 2011-07-15 LAB — LDL CHOLESTEROL, DIRECT: Direct LDL: 88.4 mg/dL

## 2011-07-15 LAB — PSA: PSA: 0.51 ng/mL (ref 0.10–4.00)

## 2011-07-15 LAB — TSH: TSH: 0.58 u[IU]/mL (ref 0.35–5.50)

## 2011-07-15 NOTE — Patient Instructions (Addendum)
The patient is instructed to continue all medications as prescribed. Schedule followup with check out clerk upon leaving the clinic    Balancing her diet helps to keep the blood glucose is low in volume have as a simple carbohydrate it doesn't matter whether it's ice cream or potatoes it can elevate your blood sugar in the morning but if yellow balance between complex carbohydrates simple carbohydrates and protein source that it helps to keep your blood sugar down.   Consider the plate have 3 parts to it.... One part lead protein source One part simple carbohydrate or fast carbohydrate...[name] potatoes rice white bread corn lima beans English peas One  part slow carbohydrates and this includes vegetables whole grain and fruit from trees

## 2011-07-15 NOTE — Progress Notes (Signed)
Subjective:    Patient ID: Gabriel Hamilton, male    DOB: 1971/04/16, 41 y.o.   MRN: [id number]  HPI CPX CBG's have been very variable in the morning Today the AM reading was 194  30 day average is 147 Has not been balancing carbohydrates.  Review of Systems  Constitutional: Negative for fever and fatigue.  HENT: Negative for hearing loss, congestion, neck pain and postnasal drip.   Eyes: Negative for discharge, redness and visual disturbance.  Respiratory: Negative for cough, shortness of breath and wheezing.   Cardiovascular: Negative for leg swelling.  Gastrointestinal: Negative for abdominal pain, constipation and abdominal distention.  Genitourinary: Negative for urgency and frequency.  Musculoskeletal: Negative for joint swelling and arthralgias.  Skin: Negative for color change and rash.  Neurological: Negative for weakness and light-headedness.  Hematological: Negative for adenopathy.  Psychiatric/Behavioral: Negative for behavioral problems.   Past Medical History  Diagnosis Date  . Diabetes mellitus   . Acne   . GERD (gastroesophageal reflux disease)   . Hypertension   . Heart murmur     History   Social History  . Marital Status: Single    Spouse Name: N/A    Number of Children: N/A  . Years of Education: N/A   Occupational History  . Not on file.   Social History Main Topics  . Smoking status: Current Everyday Smoker -- 0.3 packs/day    Types: Cigarettes  . Smokeless tobacco: Not on file  . Alcohol Use: Yes  . Drug Use: No  . Sexually Active: Yes   Other Topics Concern  . Not on file   Social History Narrative  . No narrative on file    No past surgical history on file.  Family History  Problem Relation Age of Onset  . Arthritis Mother   . Diabetes Father   . Heart disease Father   . Hyperlipidemia Father   . Hypertension Father     No Known Allergies  Current Outpatient Prescriptions on File Prior to Visit  Medication Sig Dispense  Refill  . benazepril (LOTENSIN) 10 MG tablet Take 10 mg by mouth daily.        [name] glimepiride (AMARYL) 4 MG tablet Take 1 tablet (4 mg total) by mouth daily before breakfast.  30 tablet  11  . glucose blood (ACCU-CHEK AVIVA) test strip Use as directed  50 each  11  . glucose blood test strip 1 each by Other route as needed. Use as instructed       . KOMBIGLYZE XR 2.10-998 MG TB24 TAKE TWO TABLETS BY MOUTH DAILY  60 tablet  2  . omeprazole (PRILOSEC) 20 MG capsule Take 20 mg by mouth daily.        . Testosterone (ANDROGEL PUMP) 1.25 GM/ACT (1%) GEL Place onto the skin daily. 4 pumps         BP 130/80  Pulse 84  Temp 98.1 F (36.7 C)  Resp 16  Ht 5' 8.5" (1.74 m)  Wt 200 lb (90.719 kg)  BMI 29.97 kg/m2       Objective:   Physical Exam  Nursing note and vitals reviewed. Constitutional: He is oriented to person, place, and time. He appears well-developed and well-nourished.  HENT:  Head: Normocephalic and atraumatic.  Eyes: Conjunctivae are normal. Pupils are equal, round, and reactive to light.  Neck: Normal range of motion. Neck supple.  Cardiovascular: Normal rate and regular rhythm.   Pulmonary/Chest: Effort normal and breath sounds normal.  Abdominal:  Soft. Bowel sounds are normal.  Genitourinary: Rectum normal and prostate normal.  Musculoskeletal: Normal range of motion.  Neurological: He is alert and oriented to person, place, and time.  Skin: Skin is warm and dry.  Psychiatric: He has a normal mood and affect. His behavior is normal.          Assessment & Plan:   Patient presents for yearly preventative medicine examination.   all immunizations and health maintenance protocols were reviewed with the patient and they are up to date with these protocols.   screening laboratory values were reviewed with the patient including screening of hyperlipidemia PSA renal function and hepatic function.   There medications past medical history social history problem list and  allergies were reviewed in detail.   Goals were established with regard to weight loss exercise diet in compliance with medications

## 2011-07-16 ENCOUNTER — Other Ambulatory Visit: Payer: Self-pay | Admitting: Internal Medicine

## 2011-07-16 ENCOUNTER — Telehealth: Payer: Self-pay | Admitting: Family Medicine

## 2011-07-16 DIAGNOSIS — D751 Secondary polycythemia: Secondary | ICD-10-CM

## 2011-07-16 NOTE — Telephone Encounter (Signed)
[facility] Triage Call Report Triage Record Num: [id number] Operator: Amy Head Patient Name: Gabriel Hamilton Call Date & Time: 07/15/2011 6:48:53PM Patient Phone: [phone] PCP: Darryll Capers Patient Gender: Male PCP Fax : [phone] Patient DOB: 12/16/1970 [facility] Name[facility] Reason for Call: Caller: Rodrigo/Patient; PCP: Darryll Capers; CB#: [phone]; Call regarding Lab work; States that he was in the office today and had labwork done. Wants to know what results were. States that Agency called him and instructed him to call office for results. Advised that office is closed and to F/U in AM for results. Protocol(s) Used: Office Note Recommended Outcome per Protocol: Information Noted and Sent to Office Reason for Outcome: Caller information to office Care Advice: ~ 01/

## 2011-07-16 NOTE — Telephone Encounter (Signed)
Talked with pt 

## 2011-07-24 ENCOUNTER — Telehealth: Payer: Self-pay | Admitting: Oncology

## 2011-07-24 NOTE — Telephone Encounter (Signed)
Called pt today re appt w/FS. Pt was w/a client and asked if he could call me back. Pt was given my direct number to call me back.

## 2011-07-27 ENCOUNTER — Telehealth: Payer: Self-pay | Admitting: Oncology

## 2011-07-27 NOTE — Telephone Encounter (Signed)
lmonvm for pt re calling me for appt w/FS. °

## 2011-07-28 ENCOUNTER — Telehealth: Payer: Self-pay | Admitting: Oncology

## 2011-07-28 NOTE — Telephone Encounter (Signed)
S/w pt re appt for 2/12 @ 10:30 am w/FS.

## 2011-07-29 ENCOUNTER — Telehealth: Payer: Self-pay | Admitting: Oncology

## 2011-07-29 NOTE — Telephone Encounter (Signed)
Del.Chart °

## 2011-07-30 ENCOUNTER — Telehealth: Payer: Self-pay | Admitting: Oncology

## 2011-07-30 NOTE — Telephone Encounter (Signed)
pt called as he cannot do fs on 2/12,per amy ok to switch pt to [facility] and appt now on 2/13 at 9:30,[place]   aom

## 2011-08-04 ENCOUNTER — Ambulatory Visit: Payer: BC Managed Care – PPO

## 2011-08-04 ENCOUNTER — Other Ambulatory Visit: Payer: BC Managed Care – PPO

## 2011-08-04 ENCOUNTER — Ambulatory Visit: Payer: BC Managed Care – PPO | Admitting: Oncology

## 2011-08-05 ENCOUNTER — Ambulatory Visit: Payer: BC Managed Care – PPO

## 2011-08-05 ENCOUNTER — Other Ambulatory Visit (HOSPITAL_BASED_OUTPATIENT_CLINIC_OR_DEPARTMENT_OTHER): Payer: BC Managed Care – PPO | Admitting: Lab

## 2011-08-05 ENCOUNTER — Telehealth: Payer: Self-pay | Admitting: Internal Medicine

## 2011-08-05 ENCOUNTER — Ambulatory Visit: Payer: BC Managed Care – PPO | Admitting: Internal Medicine

## 2011-08-05 ENCOUNTER — Other Ambulatory Visit: Payer: BC Managed Care – PPO | Admitting: Lab

## 2011-08-05 ENCOUNTER — Ambulatory Visit (HOSPITAL_BASED_OUTPATIENT_CLINIC_OR_DEPARTMENT_OTHER): Payer: BC Managed Care – PPO | Admitting: Internal Medicine

## 2011-08-05 VITALS — BP 148/94 | HR 107 | Temp 98.7°F | Ht 68.0 in | Wt 202.2 lb

## 2011-08-05 DIAGNOSIS — D751 Secondary polycythemia: Secondary | ICD-10-CM

## 2011-08-05 DIAGNOSIS — D45 Polycythemia vera: Secondary | ICD-10-CM

## 2011-08-05 LAB — CBC WITH DIFFERENTIAL/PLATELET
EOS%: 2.7 % (ref 0.0–7.0)
Eosinophils Absolute: 0.2 10*3/uL (ref 0.0–0.5)
LYMPH%: 30.6 % (ref 14.0–49.0)
MCH: 30.2 pg (ref 27.2–33.4)
MCV: 90.6 fL (ref 79.3–98.0)
MONO%: 3.9 % (ref 0.0–14.0)
NEUT#: 3.7 10*3/uL (ref 1.5–6.5)
Platelets: 269 10*3/uL (ref 140–400)
RBC: 6.21 10*6/uL — ABNORMAL HIGH (ref 4.20–5.82)
RDW: 14.3 % (ref 11.0–14.6)

## 2011-08-05 NOTE — Telephone Encounter (Signed)
appts made and printed for labs/phleb and md visit   aom

## 2011-08-05 NOTE — Progress Notes (Signed)
[facility] Telephone:([phone]   Fax:(336) [phone]  CONSULT NOTE  REASON FOR CONSULTATION:  41 years old [place] American male with polycythemia.  HPI REASON HELZER is a 41 y.o. male past medical history significant for diabetes mellitus, hypertension, GERD and history of erectile dysfunction as well as heart murmur. The patient was seen by his primary care physician recently for routine physical examination. CBC performed on 07/14/2014 showed elevated hemoglobin of 18.6 and hematocrit 55.2. Previous CBC on 01/01/2010 showed a hemoglobin of 17.1 and hematocrit of 50.6. The patient is not currently on any iron supplements except for multivitamins. He was treated with his testosterone for erectile dysfunction and androgen deficiency for a couple of years, but he mentioned the last time he used it was almost a year ago. He used to eat a lot red meat in his diet but now start switching to a white meat. He denied having any significant complaints today except for mild fatigue at the end of the day. He denied having any itching with warm showers. He has occasional left-sided nagging chest pain that improved with massaging. No neurological abnormalities, no visual changes. He has a history of smoking half a pack per day for around 20 years. He also has 2-3 vodka drinks every day. He denied having any significant shortness of breath, no cough or hemoptysis. No weight loss or night sweats, no palpable lymphadenopathy and no bleeding issues. @SFHPI @  Past Medical History  Diagnosis Date  . Diabetes mellitus   . Acne   . GERD (gastroesophageal reflux disease)   . Hypertension   . Heart murmur     No past surgical history on file.  Family History  Problem Relation Age of Onset  . Arthritis Mother   . Diabetes Father   . Heart disease Father   . Hyperlipidemia Father   . Hypertension Father     Social History History  Substance Use Topics  . Smoking status: Current  Everyday Smoker -- 0.3 packs/day    Types: Cigarettes  . Smokeless tobacco: Not on file  . Alcohol Use: Yes    No Known Allergies  Current Outpatient Prescriptions  Medication Sig Dispense Refill  . AMOXICILLIN PO Take by mouth. 2 tablets daily.  Pt unsure of dose.      . benazepril (LOTENSIN) 10 MG tablet Take 10 mg by mouth daily.        [name] glimepiride (AMARYL) 4 MG tablet Take 1 tablet (4 mg total) by mouth daily before breakfast.  30 tablet  11  . glucose blood (ACCU-CHEK AVIVA) test strip Use as directed  50 each  11  . glucose blood test strip 1 each by Other route as needed. Use as instructed       . KOMBIGLYZE XR 2.10-998 MG TB24 TAKE TWO TABLETS BY MOUTH DAILY  60 tablet  2  . omeprazole (PRILOSEC) 20 MG capsule Take 20 mg by mouth daily.        . Testosterone (ANDROGEL PUMP) 1.25 GM/ACT (1%) GEL Place onto the skin daily. 4 pumps         Review of Systems  A comprehensive review of systems was negative except for: Constitutional: positive for fatigue Respiratory: positive for pleurisy/chest pain  Physical Exam  AVW:UJWJX, healthy, no distress, well nourished and well developed SKIN: skin color, texture, turgor are normal HEAD: Normocephalic, No masses, lesions, tenderness or abnormalities EYES: normal EARS: External ears normal OROPHARYNX:no exudate, no erythema and lips, buccal  mucosa, and tongue normal  NECK: supple, no adenopathy LYMPH:  no palpable lymphadenopathy, no hepatosplenomegaly LUNGS: clear to auscultation  HEART: regular rate & rhythm, no murmurs and no gallops ABDOMEN:abdomen soft, non-tender, obese, normal bowel sounds and no masses or organomegaly BACK: Back symmetric, no curvature. EXTREMITIES:no joint deformities, effusion, or inflammation, no edema, no skin discoloration, no clubbing, no cyanosis  NEURO: alert & oriented x 3 with fluent speech, no focal motor/sensory deficits, gait normal    Studies/Results: No results found.   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 I have a lengthy discussion with the patient today about his condition and treatment options.  PLAN: I ordered several studies to evaluate his condition including repeat CBC, comprehensive metabolic panel, LDH, iron study and ferritin in addition to erythropoietin level and JAK 2 mutation. Previous studies pending. I also recommended for the patient to proceed with a phlebotomy today. I will continue this on a weekly basis until his hematocrit is less than 45%. I advised the patient to decrease the amount of red meat in his diet. I would see him back for followup visit in 2 weeks for reevaluation and discussion of his pending lab results. I given the patientthea time to ask questions and I answered them completely to his satisfaction.  All questions were answered. The patient knows to call the clinic with any problems, questions or concerns. We can certainly see the patient much sooner if necessary.  Thank you so much for allowing me to participate in the care of Gabriel Hamilton. I will continue to follow up the patient with you and assist in his care.  I spent 30 minutes counseling the patient face to face. The total time spent in the appointment was 55 minutes.   Emiliano Welshans K. 08/05/2011, 10:50 PM

## 2011-08-06 LAB — FERRITIN: Ferritin: 99 ng/mL (ref 22–322)

## 2011-08-06 LAB — IRON AND TIBC
%SAT: 30 % (ref 20–55)
Iron: 144 ug/dL (ref 42–165)
TIBC: 480 ug/dL — ABNORMAL HIGH (ref 215–435)

## 2011-08-06 LAB — COMPREHENSIVE METABOLIC PANEL
AST: 18 U/L (ref 0–37)
Alkaline Phosphatase: 68 U/L (ref 39–117)
BUN: 9 mg/dL (ref 6–23)
Glucose, Bld: 203 mg/dL — ABNORMAL HIGH (ref 70–99)
Sodium: 138 mEq/L (ref 135–145)
Total Bilirubin: 0.6 mg/dL (ref 0.3–1.2)

## 2011-08-07 ENCOUNTER — Other Ambulatory Visit: Payer: Self-pay | Admitting: *Deleted

## 2011-08-10 LAB — JAK2 GENOTYPR

## 2011-08-12 ENCOUNTER — Ambulatory Visit (HOSPITAL_BASED_OUTPATIENT_CLINIC_OR_DEPARTMENT_OTHER): Payer: BC Managed Care – PPO

## 2011-08-12 ENCOUNTER — Other Ambulatory Visit: Payer: BC Managed Care – PPO | Admitting: Lab

## 2011-08-12 DIAGNOSIS — D751 Secondary polycythemia: Secondary | ICD-10-CM

## 2011-08-12 DIAGNOSIS — D45 Polycythemia vera: Secondary | ICD-10-CM

## 2011-08-12 LAB — CBC WITH DIFFERENTIAL/PLATELET
BASO%: 0.2 % (ref 0.0–2.0)
Basophils Absolute: 0 10*3/uL (ref 0.0–0.1)
EOS%: 5.5 % (ref 0.0–7.0)
HGB: 17.7 g/dL — ABNORMAL HIGH (ref 13.0–17.1)
MCH: 30.4 pg (ref 27.2–33.4)
MCHC: 34.4 g/dL (ref 32.0–36.0)
MCV: 88.3 fL (ref 79.3–98.0)
MONO%: 5.5 % (ref 0.0–14.0)
RDW: 14.4 % (ref 11.0–14.6)

## 2011-08-12 NOTE — Progress Notes (Signed)
Removed 520g by phlebotomy.  Pt tolerated well with no complaints.  Given food and drink.  DC'd home.

## 2011-08-19 ENCOUNTER — Other Ambulatory Visit (HOSPITAL_BASED_OUTPATIENT_CLINIC_OR_DEPARTMENT_OTHER): Payer: BC Managed Care – PPO | Admitting: Lab

## 2011-08-19 ENCOUNTER — Ambulatory Visit (HOSPITAL_BASED_OUTPATIENT_CLINIC_OR_DEPARTMENT_OTHER): Payer: BC Managed Care – PPO

## 2011-08-19 DIAGNOSIS — D751 Secondary polycythemia: Secondary | ICD-10-CM

## 2011-08-19 DIAGNOSIS — D45 Polycythemia vera: Secondary | ICD-10-CM

## 2011-08-19 LAB — CBC WITH DIFFERENTIAL/PLATELET
BASO%: 0.3 % (ref 0.0–2.0)
LYMPH%: 37.7 % (ref 14.0–49.0)
MCHC: 33.6 g/dL (ref 32.0–36.0)
MCV: 90.9 fL (ref 79.3–98.0)
MONO%: 5 % (ref 0.0–14.0)
Platelets: 260 10*3/uL (ref 140–400)
RBC: 5.56 10*6/uL (ref 4.20–5.82)
RDW: 14.8 % — ABNORMAL HIGH (ref 11.0–14.6)
WBC: 6.7 10*3/uL (ref 4.0–10.3)

## 2011-08-19 NOTE — Progress Notes (Signed)
Patient received therapeutic phlebotomy without complications- 520g removed.  Given snack and juice post phlebotomy.  Patient requested not to stay the full 30 minutes- remained approximately 10 minutes post phlebotomy.

## 2011-08-26 ENCOUNTER — Ambulatory Visit (HOSPITAL_BASED_OUTPATIENT_CLINIC_OR_DEPARTMENT_OTHER): Payer: BC Managed Care – PPO | Admitting: Internal Medicine

## 2011-08-26 ENCOUNTER — Other Ambulatory Visit (HOSPITAL_BASED_OUTPATIENT_CLINIC_OR_DEPARTMENT_OTHER): Payer: BC Managed Care – PPO | Admitting: Lab

## 2011-08-26 ENCOUNTER — Telehealth: Payer: Self-pay | Admitting: Internal Medicine

## 2011-08-26 ENCOUNTER — Ambulatory Visit (HOSPITAL_BASED_OUTPATIENT_CLINIC_OR_DEPARTMENT_OTHER): Payer: BC Managed Care – PPO

## 2011-08-26 VITALS — BP 131/87 | HR 91 | Temp 97.5°F | Ht 68.0 in | Wt 200.2 lb

## 2011-08-26 DIAGNOSIS — D45 Polycythemia vera: Secondary | ICD-10-CM

## 2011-08-26 DIAGNOSIS — D751 Secondary polycythemia: Secondary | ICD-10-CM | POA: Insufficient documentation

## 2011-08-26 LAB — CBC WITH DIFFERENTIAL/PLATELET
BASO%: 0.5 % (ref 0.0–2.0)
MCHC: 34.1 g/dL (ref 32.0–36.0)
MONO#: 0.4 10*3/uL (ref 0.1–0.9)
RBC: 5.22 10*6/uL (ref 4.20–5.82)
RDW: 14.9 % — ABNORMAL HIGH (ref 11.0–14.6)
WBC: 6.4 10*3/uL (ref 4.0–10.3)
lymph#: 2 10*3/uL (ref 0.9–3.3)

## 2011-08-26 NOTE — Progress Notes (Signed)
1410-Pt discharged to home after eating snack, VS stable and dressing clean, dry and intact to right ac.

## 2011-08-26 NOTE — Progress Notes (Signed)
At 1335 from 1345,  One unit of Phlebotomy  Completed on pt's Left AC site.  Pt tolerated procedure well. Able to  Eat snack of  Cheese Sticks crackers and juice. Denies dizziness or faintness. No bleeding noted from procedure site

## 2011-08-26 NOTE — Telephone Encounter (Signed)
PT CALLED TO MAKE HIS ONE MONTH APPT,MADE FOR 09/23/11  AOM

## 2011-08-26 NOTE — Progress Notes (Signed)
[name] Telephone:([phone]   Fax:([phone]  OFFICE PROGRESS NOTE  Carrie Mew, MD, MD [address] [place] [address]  DIAGNOSIS: Polycythemia most likely reactive in nature.  PRIOR THERAPY: None  CURRENT THERAPY: Phlebotomy on as needed basis.  INTERVAL HISTORY: Gabriel Hamilton 41 y.o. male returns to the clinic today for followup visit. The patient was seen recently for evaluation of polycythemia. I ordered JAK-2 mutation study on him and it was negative. He also has iron study which showed normal I iron of 144 total iron binding capacity was 480, iron saturation was 30%, ferritin was 99. Serum erythropoietin was normal at 29.6. The patient had 2 phlebotomies performed in the last 2 weeks. He is feeling fine with no specific complaints. He denied having any headache or blurry vision. No significant weight loss but has occasional night sweats. He has repeat CBC performed earlier today and he is here for evaluation and discussion of his lab results.  MEDICAL HISTORY: Past Medical History  Diagnosis Date  . Diabetes mellitus   . Acne   . GERD (gastroesophageal reflux disease)   . Hypertension   . Heart murmur     ALLERGIES:   has no known allergies.  MEDICATIONS:  Current Outpatient Prescriptions  Medication Sig Dispense Refill  . AMOXICILLIN PO Take by mouth. 2 tablets daily.  Pt unsure of dose.      [name] glimepiride (AMARYL) 4 MG tablet Take 1 tablet (4 mg total) by mouth daily before breakfast.  30 tablet  11  . glucose blood (ACCU-CHEK AVIVA) test strip Use as directed  50 each  11  . glucose blood test strip 1 each by Other route as needed. Use as instructed       . KOMBIGLYZE XR 2.10-998 MG TB24 TAKE TWO TABLETS BY MOUTH DAILY  60 tablet  2  . omeprazole (PRILOSEC) 20 MG capsule Take 20 mg by mouth daily.      . benazepril (LOTENSIN) 10 MG tablet Take 10 mg by mouth daily.          REVIEW OF SYSTEMS:  A comprehensive  review of systems was negative.   PHYSICAL EXAMINATION: General appearance: alert, cooperative and no distress Neck: no adenopathy Lymph nodes: Cervical, supraclavicular, and axillary nodes normal. Resp: clear to auscultation bilaterally Cardio: regular rate and rhythm, S1, S2 normal, no murmur, click, rub or gallop GI: soft, non-tender; bowel sounds normal; no masses,  no organomegaly Extremities: extremities normal, atraumatic, no cyanosis or edema Neurologic: Alert and oriented X 3, normal strength and tone. Normal symmetric reflexes. Normal coordination and gait  ECOG PERFORMANCE STATUS: 0 - Asymptomatic  Blood pressure 131/87, pulse 91, temperature 97.5 F (36.4 C), temperature source Oral, height 5\' 8"  (1.727 m), weight 200 lb 3.2 oz (90.81 kg).  LABORATORY DATA: Lab Results  Component Value Date   WBC 6.4 08/26/2011   HGB 16.2 08/26/2011   HCT 47.5 08/26/2011   MCV 90.9 08/26/2011   PLT 324 08/26/2011      Chemistry      Component Value Date/Time   NA 138 08/05/2011 1405   K 4.4 08/05/2011 1405   CL 98 08/05/2011 1405   CO2 28 08/05/2011 1405   BUN 9 08/05/2011 1405   CREATININE 0.91 08/05/2011 1405      Component Value Date/Time   CALCIUM 10.3 08/05/2011 1405   ALKPHOS 68 08/05/2011 1405   AST 18 08/05/2011 1405   ALT 19  08/05/2011 1405   BILITOT 0.6 08/05/2011 1405       RADIOGRAPHIC STUDIES: No results found.  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   PLAN: I recommended for the patient to continue on phlebotomy until his hematocrit is less than 45%. I would see him back for followup visit in one month's for reevaluation with repeat CBC. He was advised to call me immediately if he has any concerning symptoms in the interval.  All questions were answered. The patient knows to call the clinic with any  problems, questions or concerns. We can certainly see the patient much sooner if necessary.

## 2011-09-04 ENCOUNTER — Other Ambulatory Visit: Payer: Self-pay | Admitting: Internal Medicine

## 2011-09-23 ENCOUNTER — Other Ambulatory Visit (HOSPITAL_BASED_OUTPATIENT_CLINIC_OR_DEPARTMENT_OTHER): Payer: BC Managed Care – PPO | Admitting: Lab

## 2011-09-23 ENCOUNTER — Telehealth: Payer: Self-pay | Admitting: Internal Medicine

## 2011-09-23 ENCOUNTER — Ambulatory Visit (HOSPITAL_BASED_OUTPATIENT_CLINIC_OR_DEPARTMENT_OTHER): Payer: BC Managed Care – PPO | Admitting: Internal Medicine

## 2011-09-23 VITALS — BP 141/87 | HR 84 | Temp 97.4°F | Ht 68.0 in | Wt 202.7 lb

## 2011-09-23 DIAGNOSIS — D751 Secondary polycythemia: Secondary | ICD-10-CM

## 2011-09-23 DIAGNOSIS — D45 Polycythemia vera: Secondary | ICD-10-CM

## 2011-09-23 LAB — BASIC METABOLIC PANEL
BUN: 12 mg/dL (ref 6–23)
Chloride: 101 mEq/L (ref 96–112)
Creatinine, Ser: 0.99 mg/dL (ref 0.50–1.35)
Potassium: 4.3 mEq/L (ref 3.5–5.3)
Sodium: 138 mEq/L (ref 135–145)

## 2011-09-23 LAB — CBC WITH DIFFERENTIAL/PLATELET
BASO%: 0.4 % (ref 0.0–2.0)
EOS%: 4 % (ref 0.0–7.0)
LYMPH%: 27.6 % (ref 14.0–49.0)
MCH: 30.5 pg (ref 27.2–33.4)
MCHC: 33.9 g/dL (ref 32.0–36.0)
MONO#: 0.5 10*3/uL (ref 0.1–0.9)
NEUT#: 4.2 10*3/uL (ref 1.5–6.5)
NEUT%: 61.3 % (ref 39.0–75.0)
RBC: 5.36 10*6/uL (ref 4.20–5.82)
RDW: 13.8 % (ref 11.0–14.6)
WBC: 6.8 10*3/uL (ref 4.0–10.3)
lymph#: 1.9 10*3/uL (ref 0.9–3.3)
nRBC: 0 % (ref 0–0)

## 2011-09-23 LAB — LACTATE DEHYDROGENASE: LDH: 126 U/L (ref 94–250)

## 2011-09-23 NOTE — Progress Notes (Signed)
[facility] [phone]) [phone]   Fax:([phone]  OFFICE PROGRESS NOTE  Carrie Mew, MD, MD [address] [place] [address]  DIAGNOSIS: Polycythemia most likely reactive in nature.   PRIOR THERAPY: None   CURRENT THERAPY: Phlebotomy on as needed basis.   INTERVAL HISTORY: Gabriel Hamilton 41 y.o. male returns to the clinic today for followup visit. The patient has no complaints today. He denied having any significant weight loss or night sweats, no headache or blurry vision. No bleeding issues. He has no chest pain or shortness of breath. He has repeat CBC performed earlier today and he is here for evaluation and discussion of his lab results.  MEDICAL HISTORY: Past Medical History  Diagnosis Date  . Diabetes mellitus   . Acne   . GERD (gastroesophageal reflux disease)   . Hypertension   . Heart murmur     ALLERGIES:   has no known allergies.  MEDICATIONS:  Current Outpatient Prescriptions  Medication Sig Dispense Refill  . AMOXICILLIN PO Take by mouth. 2 tablets daily.  Pt unsure of dose.      . benazepril (LOTENSIN) 10 MG tablet Take 10 mg by mouth daily.        [name] glimepiride (AMARYL) 4 MG tablet TAKE 1 TABLET BY MOUTH DAILY BEFORE BREAKFAST.  30 tablet  10  . glucose blood (ACCU-CHEK AVIVA) test strip Use as directed  50 each  11  . glucose blood test strip 1 each by Other route as needed. Use as instructed       . KOMBIGLYZE XR 2.10-998 MG TB24 TAKE TWO TABLETS BY MOUTH DAILY  60 tablet  2  . omeprazole (PRILOSEC) 20 MG capsule Take 20 mg by mouth daily.      [name] UNABLE TO FIND at bedtime as needed. ZZZ Quil        REVIEW OF SYSTEMS:  A comprehensive review of systems was negative.   PHYSICAL EXAMINATION: General appearance: alert, cooperative and no distress Neck: no adenopathy Lymph nodes: Cervical, supraclavicular, and axillary nodes normal. Resp: clear to auscultation bilaterally Cardio: regular rate and rhythm,  S1, S2 normal, no murmur, click, rub or gallop GI: soft, non-tender; bowel sounds normal; no masses,  no organomegaly Extremities: extremities normal, atraumatic, no cyanosis or edema  ECOG PERFORMANCE STATUS: 0 - Asymptomatic  Blood pressure 141/87, pulse 84, temperature 97.4 F (36.3 C), temperature source Oral, height 5\' 8"  (1.727 m), weight 202 lb 11.2 oz (91.944 kg).  LABORATORY DATA: Lab Results  Component Value Date   WBC 6.8 09/23/2011   HGB 16.4 09/23/2011   HCT 48.3 09/23/2011   MCV 90.1 09/23/2011   PLT 293 09/23/2011      Chemistry      Component Value Date/Time   NA 138 08/05/2011 1405   K 4.4 08/05/2011 1405   CL 98 08/05/2011 1405   CO2 28 08/05/2011 1405   BUN 9 08/05/2011 1405   CREATININE 0.91 08/05/2011 1405      Component Value Date/Time   CALCIUM 10.3 08/05/2011 1405   ALKPHOS 68 08/05/2011 1405   AST 18 08/05/2011 1405   ALT 19 08/05/2011 1405   BILITOT 0.6 08/05/2011 1405       RADIOGRAPHIC STUDIES: No results found.  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 The patient is doing fine with no significant increase in his hemoglobin and hematocrit.  PLAN: I discussed the lab result with the patient. I recommended for him continuous observation for now with repeat CBC and comprehensive metabolic panel in 2 months. He would come back for followup visit at that time.  All questions were answered. The patient knows to call the clinic with any problems, questions or concerns. We can certainly see the patient much sooner if necessary.

## 2011-09-23 NOTE — Telephone Encounter (Signed)
appts made and printed for pt aom °

## 2011-11-11 ENCOUNTER — Ambulatory Visit: Payer: BC Managed Care – PPO | Admitting: Internal Medicine

## 2011-11-25 ENCOUNTER — Other Ambulatory Visit (HOSPITAL_BASED_OUTPATIENT_CLINIC_OR_DEPARTMENT_OTHER): Payer: BC Managed Care – PPO

## 2011-11-25 ENCOUNTER — Ambulatory Visit (HOSPITAL_BASED_OUTPATIENT_CLINIC_OR_DEPARTMENT_OTHER): Payer: BC Managed Care – PPO | Admitting: Internal Medicine

## 2011-11-25 ENCOUNTER — Encounter: Payer: Self-pay | Admitting: Internal Medicine

## 2011-11-25 ENCOUNTER — Ambulatory Visit (INDEPENDENT_AMBULATORY_CARE_PROVIDER_SITE_OTHER): Payer: BC Managed Care – PPO | Admitting: Internal Medicine

## 2011-11-25 VITALS — BP 129/87 | HR 89 | Temp 97.6°F | Ht 68.0 in | Wt 197.8 lb

## 2011-11-25 VITALS — BP 140/90 | HR 72 | Temp 98.6°F | Resp 16 | Ht 68.0 in | Wt 196.0 lb

## 2011-11-25 DIAGNOSIS — R519 Headache, unspecified: Secondary | ICD-10-CM

## 2011-11-25 DIAGNOSIS — D45 Polycythemia vera: Secondary | ICD-10-CM

## 2011-11-25 DIAGNOSIS — D751 Secondary polycythemia: Secondary | ICD-10-CM

## 2011-11-25 DIAGNOSIS — R51 Headache: Secondary | ICD-10-CM

## 2011-11-25 DIAGNOSIS — I10 Essential (primary) hypertension: Secondary | ICD-10-CM

## 2011-11-25 DIAGNOSIS — IMO0001 Reserved for inherently not codable concepts without codable children: Secondary | ICD-10-CM

## 2011-11-25 LAB — CBC WITH DIFFERENTIAL/PLATELET
BASO%: 0.6 % (ref 0.0–2.0)
Eosinophils Absolute: 0.3 10*3/uL (ref 0.0–0.5)
HCT: 50.9 % — ABNORMAL HIGH (ref 38.4–49.9)
MCHC: 33.4 g/dL (ref 32.0–36.0)
MONO#: 0.4 10*3/uL (ref 0.1–0.9)
NEUT#: 2.5 10*3/uL (ref 1.5–6.5)
Platelets: 248 10*3/uL (ref 140–400)
RBC: 5.84 10*6/uL — ABNORMAL HIGH (ref 4.20–5.82)
WBC: 5 10*3/uL (ref 4.0–10.3)
lymph#: 1.8 10*3/uL (ref 0.9–3.3)
nRBC: 0 % (ref 0–0)

## 2011-11-25 LAB — COMPREHENSIVE METABOLIC PANEL
Alkaline Phosphatase: 78 U/L (ref 39–117)
BUN: 8 mg/dL (ref 6–23)
Glucose, Bld: 185 mg/dL — ABNORMAL HIGH (ref 70–99)
Total Bilirubin: 0.5 mg/dL (ref 0.3–1.2)

## 2011-11-25 MED ORDER — AMOXICILLIN 500 MG PO CAPS: 500.0000 mg | ORAL_CAPSULE | Freq: Two times a day (BID) | ORAL | Status: AC

## 2011-11-25 MED ORDER — BENAZEPRIL HCL 20 MG PO TABS: 20.0000 mg | ORAL_TABLET | Freq: Every day | ORAL | Status: DC

## 2011-11-25 MED ORDER — BENAZEPRIL HCL 20 MG PO TABS: 10.0000 mg | ORAL_TABLET | Freq: Every day | ORAL | Status: DC

## 2011-11-25 NOTE — Patient Instructions (Signed)
The patient is instructed to continue all medications as prescribed. Schedule followup with check out clerk upon leaving the clinic  

## 2011-11-25 NOTE — Progress Notes (Signed)
[facility] Telephone:(336) [phone]   Fax:(336) [phone]  OFFICE PROGRESS NOTE  Gabriel Mew, MD, MD [address] [place] [address]  DIAGNOSIS: Polycythemia most likely reactive in nature.   PRIOR THERAPY: None   CURRENT THERAPY: Phlebotomy on as needed basis.   INTERVAL HISTORY: Gabriel Hamilton 41 y.o. male returns to the clinic today for followup visit. The patient is feeling fine today with no specific complaints except for occasional headache. He attributed it to some dental issues and he scheduled to see his dentist in the next few days. He denied having any significant weight loss or night sweats. He has no chest pain or shortness of breath, no cough or hemoptysis. Unfortunately he continues to smoke. The patient has repeat CBC performed earlier today and he is here for evaluation and discussion of his lab results.  MEDICAL HISTORY: Past Medical History  Diagnosis Date  . Diabetes mellitus   . Acne   . GERD (gastroesophageal reflux disease)   . Hypertension   . Heart murmur     ALLERGIES:   has no known allergies.  MEDICATIONS:  Current Outpatient Prescriptions  Medication Sig Dispense Refill  . AMOXICILLIN PO Take 500 mg by mouth 2 (two) times daily. 2 tablets daily.      . benazepril (LOTENSIN) 10 MG tablet Take 10 mg by mouth daily.        [name] glimepiride (AMARYL) 4 MG tablet TAKE 1 TABLET BY MOUTH DAILY BEFORE BREAKFAST.  30 tablet  10  . glucose blood (ACCU-CHEK AVIVA) test strip Use as directed  50 each  11  . glucose blood test strip 1 each by Other route as needed. Use as instructed       . KOMBIGLYZE XR 2.10-998 MG TB24 TAKE TWO TABLETS BY MOUTH DAILY  60 tablet  2  . omeprazole (PRILOSEC) 20 MG capsule Take 20 mg by mouth daily.      [name] UNABLE TO FIND at bedtime as needed. ZZZ Quil        REVIEW OF SYSTEMS:  A comprehensive review of systems was negative except for: Neurological: positive for headaches   PHYSICAL  EXAMINATION: General appearance: alert, cooperative and no distress Neck: no adenopathy Lymph nodes: Cervical, supraclavicular, and axillary nodes normal. Resp: clear to auscultation bilaterally Cardio: regular rate and rhythm, S1, S2 normal, no murmur, click, rub or gallop GI: soft, non-tender; bowel sounds normal; no masses,  no organomegaly Extremities: extremities normal, atraumatic, no cyanosis or edema Neurologic: Alert and oriented X 3, normal strength and tone. Normal symmetric reflexes. Normal coordination and gait  ECOG PERFORMANCE STATUS: 0 - Asymptomatic  Blood pressure 129/87, pulse 89, temperature 97.6 F (36.4 C), temperature source Oral, height 5\' 8"  (1.727 m), weight 197 lb 12.8 oz (89.721 kg).  LABORATORY DATA: Lab Results  Component Value Date   WBC 5.0 11/25/2011   HGB 17.0 11/25/2011   HCT 50.9* 11/25/2011   MCV 87.1 11/25/2011   PLT 248 11/25/2011      Chemistry      Component Value Date/Time   NA 138 09/23/2011 0927   K 4.3 09/23/2011 0927   CL 101 09/23/2011 0927   CO2 22 09/23/2011 0927   BUN 12 09/23/2011 0927   CREATININE 0.99 09/23/2011 0927      Component Value Date/Time   CALCIUM 10.0 09/23/2011 0927   ALKPHOS 68 08/05/2011 1405   AST 18 08/05/2011 1405   ALT 19 08/05/2011 1405  BILITOT 0.6 08/05/2011 1405       RADIOGRAPHIC STUDIES: No results found.  ASSESSMENT: This is a very pleasant 41 years old [place] American male with reactive polycythemia currently on observation and phlebotomy on as needed basis. The patient is doing fine but there is elevation of his hemoglobin and hematocrit.  PLAN: I recommended for him to proceed with phlebotomy next week. I would see him back for followup visit in one month's for reevaluation. Will try to keep his hematocrit close to 45%. The patient was advised to call me immediately if he has any concerning symptoms in the interval. I strongly encouraged the patient to quit smoking and I offered him smoke cessation class. He  would like some time to think about it.  All questions were answered. The patient knows to call the clinic with any problems, questions or concerns. We can certainly see the patient much sooner if necessary.

## 2011-11-25 NOTE — Progress Notes (Signed)
Subjective:    Patient ID: Gabriel Hamilton, male    DOB: 1971-06-03, 41 y.o.   MRN: [id number]  HPI  Wide fluctuation in CBGs with diet and activity Increase left sided "temple" head aches. Daily head aches with hx of HTN Poorly controlled blood pressure Strong family history for diabetes and cardiovascular disease Social issues with diet and weight control      Review of Systems  Constitutional: Negative for fever and fatigue.  HENT: Negative for hearing loss, congestion, neck pain and postnasal drip.   Eyes: Negative for discharge, redness and visual disturbance.  Respiratory: Negative for cough, shortness of breath and wheezing.   Cardiovascular: Negative for leg swelling.  Gastrointestinal: Negative for abdominal pain, constipation and abdominal distention.  Genitourinary: Negative for urgency and frequency.  Musculoskeletal: Negative for joint swelling and arthralgias.  Skin: Negative for color change and rash.  Neurological: Positive for headaches. Negative for weakness and light-headedness.  Hematological: Negative for adenopathy.  Psychiatric/Behavioral: Negative for behavioral problems.   Past Medical History  Diagnosis Date  . Diabetes mellitus   . Acne   . GERD (gastroesophageal reflux disease)   . Hypertension   . Heart murmur     History   Social History  . Marital Status: Single    Spouse Name: N/A    Number of Children: N/A  . Years of Education: N/A   Occupational History  . Not on file.   Social History Main Topics  . Smoking status: Current Every Day Smoker -- 0.3 packs/day    Types: Cigarettes  . Smokeless tobacco: Not on file  . Alcohol Use: Yes  . Drug Use: No  . Sexually Active: Yes   Other Topics Concern  . Not on file   Social History Narrative  . No narrative on file    No past surgical history on file.  Family History  Problem Relation Age of Onset  . Arthritis Mother   . Diabetes Father   . Heart disease Father   .  Hyperlipidemia Father   . Hypertension Father     No Known Allergies  Current Outpatient Prescriptions on File Prior to Visit  Medication Sig Dispense Refill  . benazepril (LOTENSIN) 20 MG tablet Take 1 tablet (20 mg total) by mouth daily.  30 tablet  11  . glimepiride (AMARYL) 4 MG tablet TAKE 1 TABLET BY MOUTH DAILY BEFORE BREAKFAST.  30 tablet  10  . glucose blood test strip 1 each by Other route as needed. Use as instructed       . KOMBIGLYZE XR 2.10-998 MG TB24 TAKE TWO TABLETS BY MOUTH DAILY  60 tablet  2  . omeprazole (PRILOSEC) 20 MG capsule Take 20 mg by mouth daily.        BP 140/90  Pulse 72  Temp 98.6 F (37 C)  Resp 16  Ht 5\' 8"  (1.727 m)  Wt 196 lb (88.905 kg)  BMI 29.80 kg/m2       Objective:   Physical Exam  Nursing note and vitals reviewed. Constitutional: He is oriented to person, place, and time. He appears well-developed and well-nourished.  HENT:  Head: Normocephalic and atraumatic.       No temporal tenderness noted  Eyes: Conjunctivae normal are normal. Pupils are equal, round, and reactive to light.  Neck: Normal range of motion. Neck supple.  Cardiovascular: Normal rate and regular rhythm.   Pulmonary/Chest: Effort normal and breath sounds normal.  Abdominal: Soft. Bowel sounds are normal.  Neurological: He is alert and oriented to person, place, and time.  Skin: Skin is warm and dry.       No rash detected specifically no vesicular rash on the forehead or          Assessment & Plan:  Poorly controlled diabetes multifactorial.  He needs to follow a diabetic diet more consistently.  He is to take his medications on a regular basis.  Needs to add a regular exercise program.  He has hypertension that is poorly controlled due to diet and salt intake I believe the headaches are more likely related to this then other etiologies.  However he does have an increased risk of hypertension and diabetes of CVA despite his young age and we'll  monitor him carefully for blood pressure control.  Has a history of reactive polycythemia which may result in increased clot risk. I recommended that he increase his aspirin to 325 mg once daily and stop smoking

## 2011-11-26 ENCOUNTER — Telehealth: Payer: Self-pay | Admitting: Internal Medicine

## 2011-11-26 NOTE — Telephone Encounter (Signed)
Gv pt appt for [id number].  called and informed him of appt on 06/12

## 2011-12-02 ENCOUNTER — Ambulatory Visit (HOSPITAL_BASED_OUTPATIENT_CLINIC_OR_DEPARTMENT_OTHER): Payer: BC Managed Care – PPO

## 2011-12-02 DIAGNOSIS — D45 Polycythemia vera: Secondary | ICD-10-CM

## 2011-12-02 DIAGNOSIS — D751 Secondary polycythemia: Secondary | ICD-10-CM

## 2011-12-02 NOTE — Patient Instructions (Signed)
Therapeutic Phlebotomy Therapeutic phlebotomy is the controlled removal of blood from your body for the purpose of treating a medical condition. It is similar to donating blood. Usually, about a pint (470 mL) of blood is removed. The average adult has 9 to 12 pints (4.3 to 5.7 L) of blood. Therapeutic phlebotomy may be used to treat the following medical conditions:  Hemochromatosis. This is a condition in which there is too much iron in the blood.   Polycythemia vera. This is a condition in which there are too many red cells in the blood.   Porphyria cutanea tarda. This is a disease usually passed from one generation to the next (inherited). It is a condition in which an important part of hemoglobin is not made properly. This results in the build up of abnormal amounts of porphyrins in the body.   Sickle cell disease. This is an inherited disease. It is a condition in which the red blood cells form an abnormal crescent shape rather than a round shape.  LET YOUR CAREGIVER KNOW ABOUT:  Allergies.   Medicines taken including herbs, eyedrops, over-the-counter medicines, and creams.   Use of steroids (by mouth or creams).   Previous problems with anesthetics or numbing medicine.   History of blood clots.   History of bleeding or blood problems.   Previous surgery.   Possibility of pregnancy, if this applies.  RISKS AND COMPLICATIONS This is a simple and safe procedure. Problems are unlikely. However, problems can occur and may include:  Nausea or lightheadedness.   Low blood pressure.   Soreness, bleeding, swelling, or bruising at the needle insertion site.   Infection.  BEFORE THE PROCEDURE  This is a procedure that can be done as an outpatient. Confirm the time that you need to arrive for your procedure. Confirm whether there is a need to fast or withhold any medications. It is helpful to wear clothing with sleeves that can be raised above the elbow. A blood sample may be done  to determine the amount of red blood cells or iron in your blood. Plan ahead of time to have someone drive you home after the procedure. PROCEDURE The entire procedure from preparation through recovery takes about 1 hour. The actual collection takes about 10 to 15 minutes.  A needle will be inserted into your vein.   Tubing and a collection bag will be attached to that needle.   Blood will flow through the needle and tubing into the collection bag.   You may be asked to open and close your hand slowly and continuously during the entire collection.   Once the specified amount of blood has been removed from your body, the collection bag and tubing will be clamped.   The needle will be removed.   Pressure will be held on the site of the needle insertion to stop the bleeding. Then a bandage will be placed over the needle insertion site.  AFTER THE PROCEDURE  Your recovery will be assessed and monitored. If there are no problems, as an outpatient, you should be able to go home shortly after the procedure.  Document Released: 11/10/2010 Document Revised: 05/28/2011 Document Reviewed: 11/10/2010 [facility] Patient Information 2012 [place], [place].  Call [facility] [phone] if further question /call 911 and go to the nearest emergency facility if needing immediate attention.

## 2011-12-21 ENCOUNTER — Ambulatory Visit (INDEPENDENT_AMBULATORY_CARE_PROVIDER_SITE_OTHER): Payer: BC Managed Care – PPO | Admitting: Internal Medicine

## 2011-12-21 VITALS — BP 118/72 | HR 97 | Temp 98.2°F | Resp 18 | Ht 69.0 in | Wt 194.0 lb

## 2011-12-21 DIAGNOSIS — R350 Frequency of micturition: Secondary | ICD-10-CM

## 2011-12-21 DIAGNOSIS — N419 Inflammatory disease of prostate, unspecified: Secondary | ICD-10-CM

## 2011-12-21 DIAGNOSIS — R300 Dysuria: Secondary | ICD-10-CM

## 2011-12-21 DIAGNOSIS — F17200 Nicotine dependence, unspecified, uncomplicated: Secondary | ICD-10-CM

## 2011-12-21 LAB — POCT UA - MICROSCOPIC ONLY

## 2011-12-21 LAB — POCT URINALYSIS DIPSTICK
Blood, UA: NEGATIVE
Ketones, UA: 15
Leukocytes, UA: NEGATIVE
pH, UA: 5.5

## 2011-12-21 MED ORDER — GLUCOSE BLOOD VI STRP: ORAL_STRIP | Status: AC

## 2011-12-21 MED ORDER — CIPROFLOXACIN HCL 500 MG PO TABS: 500.0000 mg | ORAL_TABLET | Freq: Two times a day (BID) | ORAL | Status: AC

## 2011-12-21 NOTE — Progress Notes (Signed)
  Subjective:    Patient ID: Gabriel Hamilton, male    DOB: 06-19-1971, 41 y.o.   MRN: [id number]  HPIComplaining of history of frequency urgency and difficulty starting strain for the last 4 days No hematuria, fever, back pain, or abdominal pain. He had an episode of prostatitis a few years ago Denies ongoing nocturia or daytime frequency  Also needs refills on his test strips/Dr. Lovell Sheehan primary care  Past medical history= Patient Active Problem List  Diagnosis  . DIABETES MELLITUS, TYPE II  . HYPOGONADISM  . HYPERTENSION, MODERATE  . GERD  . HEART MURMUR  . Elbow joint pain  . Polycythemia  . Nicotine addiction     Review of SystemsNoncontributory today     Objective:   Physical Exam Vital signs normal except mild overweight Abdomen benign No flank tenderness to percussion Prostate boggy, symmetrical, no nodules, and not very tender     Results for orders placed in visit on 12/21/11  POCT UA - MICROSCOPIC ONLY      Component Value Range   WBC, Ur, HPF, POC 2-6     RBC, urine, microscopic 0-1     Bacteria, U Microscopic TRACE     Mucus, UA 3+     Epithelial cells, urine per micros 0-1     Crystals, Ur, HPF, POC NEG     Casts, Ur, LPF, POC NEG     Yeast, UA NEG    POCT URINALYSIS DIPSTICK      Component Value Range   Color, UA AMBER     Clarity, UA CLEAR     Glucose, UA 100     Bilirubin, UA SMALL     Ketones, UA 15     Spec Grav, UA >=1.030     Blood, UA NEG     pH, UA 5.5     Protein, UA 30     Urobilinogen, UA 0.2     Nitrite, UA NEG     Leukocytes, UA Negative      Assessment & Plan:  Problem #1 prostatitis likely Problem #2 diabetes-refill test strips  Urine culture Meds ordered this encounter  Medications  . glucose blood (ACCU-CHEK AVIVA) test strip    Sig: Use as directed    Dispense:  50 each    Refill:  11  . ciprofloxacin (CIPRO) 500 MG tablet    Sig: Take 1 tablet (500 mg total) by mouth 2 (two) times daily.    Dispense:  20 tablet     Refill:  0

## 2011-12-22 ENCOUNTER — Encounter: Payer: Self-pay | Admitting: Internal Medicine

## 2011-12-22 DIAGNOSIS — Z720 Tobacco use: Secondary | ICD-10-CM | POA: Insufficient documentation

## 2011-12-23 ENCOUNTER — Encounter: Payer: Self-pay | Admitting: Internal Medicine

## 2011-12-23 LAB — URINE CULTURE
Colony Count: NO GROWTH
Organism ID, Bacteria: NO GROWTH

## 2011-12-30 ENCOUNTER — Other Ambulatory Visit (HOSPITAL_BASED_OUTPATIENT_CLINIC_OR_DEPARTMENT_OTHER): Payer: BC Managed Care – PPO | Admitting: Lab

## 2011-12-30 ENCOUNTER — Encounter: Payer: Self-pay | Admitting: Physician Assistant

## 2011-12-30 ENCOUNTER — Telehealth: Payer: Self-pay | Admitting: Internal Medicine

## 2011-12-30 ENCOUNTER — Ambulatory Visit (HOSPITAL_BASED_OUTPATIENT_CLINIC_OR_DEPARTMENT_OTHER): Payer: BC Managed Care – PPO | Admitting: Physician Assistant

## 2011-12-30 VITALS — BP 115/74 | HR 90 | Temp 97.4°F | Ht 69.0 in | Wt 192.8 lb

## 2011-12-30 DIAGNOSIS — D751 Secondary polycythemia: Secondary | ICD-10-CM

## 2011-12-30 DIAGNOSIS — D45 Polycythemia vera: Secondary | ICD-10-CM

## 2011-12-30 LAB — CBC WITH DIFFERENTIAL/PLATELET
EOS%: 4.3 % (ref 0.0–7.0)
Eosinophils Absolute: 0.2 10*3/uL (ref 0.0–0.5)
MCH: 28.3 pg (ref 27.2–33.4)
MCV: 85.2 fL (ref 79.3–98.0)
MONO%: 8.9 % (ref 0.0–14.0)
NEUT#: 3 10*3/uL (ref 1.5–6.5)
RBC: 5.87 10*6/uL — ABNORMAL HIGH (ref 4.20–5.82)
RDW: 14.5 % (ref 11.0–14.6)

## 2011-12-30 NOTE — Telephone Encounter (Signed)
appts made and printed for pt aom °

## 2012-01-03 NOTE — Progress Notes (Signed)
[facility] [phone]   Fax:([phone]) [phone]  OFFICE PROGRESS NOTE  Carrie Mew, MD [address] [place] [address]  DIAGNOSIS: Polycythemia most likely reactive in nature.   PRIOR THERAPY: None   CURRENT THERAPY: Phlebotomy on as needed basis.   INTERVAL HISTORY: Gabriel Hamilton 41 y.o. male returns to the clinic today for followup visit. The patient is feeling fine today with no specific complaints except for occasional headache. He attributed it to some dental issues and he scheduled to see his dentist later this afternoon. He states he is currently on a course of antibiotics for bladder infection. He continues to smoke but is now down from a half a pack every day to a half a pack of cigarettes every 2-3 days. He denied having any significant weight loss or night sweats. He has no chest pain or shortness of breath, no cough or hemoptysis. The patient had a repeat CBC performed earlier today and he is here for evaluation and discussion of his lab results.  MEDICAL HISTORY: Past Medical History  Diagnosis Date  . Diabetes mellitus   . Acne   . GERD (gastroesophageal reflux disease)   . Hypertension   . Heart murmur     ALLERGIES:   has no known allergies.  MEDICATIONS:  Current Outpatient Prescriptions  Medication Sig Dispense Refill  . ampicillin (PRINCIPEN) 500 MG capsule       . benazepril (LOTENSIN) 20 MG tablet Take 1 tablet (20 mg total) by mouth daily.  30 tablet  11  . glimepiride (AMARYL) 4 MG tablet TAKE 1 TABLET BY MOUTH DAILY BEFORE BREAKFAST.  30 tablet  10  . glucose blood (ACCU-CHEK AVIVA) test strip Use as directed  50 each  11  . glucose blood test strip 1 each by Other route as needed. Use as instructed       . KOMBIGLYZE XR 2.10-998 MG TB24 TAKE TWO TABLETS BY MOUTH DAILY  60 tablet  2  . omeprazole (PRILOSEC) 20 MG capsule Take 20 mg by mouth daily.        REVIEW OF SYSTEMS:  A comprehensive review of  systems was negative except for: Neurological: positive for headaches   PHYSICAL EXAMINATION: General appearance: alert, cooperative and no distress Neck: no adenopathy Lymph nodes: Cervical, supraclavicular, and axillary nodes normal. Resp: clear to auscultation bilaterally Cardio: regular rate and rhythm, S1, S2 normal, no murmur, click, rub or gallop GI: soft, non-tender; bowel sounds normal; no masses,  no organomegaly Extremities: extremities normal, atraumatic, no cyanosis or edema Neurologic: Alert and oriented X 3, normal strength and tone. Normal symmetric reflexes. Normal coordination and gait  ECOG PERFORMANCE STATUS: 0 - Asymptomatic  Blood pressure 115/74, pulse 90, temperature 97.4 F (36.3 C), temperature source Oral, height 5\' 9"  (1.753 m), weight 192 lb 12.8 oz (87.454 kg).  LABORATORY DATA: Lab Results  Component Value Date   WBC 5.6 12/30/2011   HGB 16.6 12/30/2011   HCT 50.0* 12/30/2011   MCV 85.2 12/30/2011   PLT 319 12/30/2011      Chemistry      Component Value Date/Time   NA 137 11/25/2011 0913   K 4.3 11/25/2011 0913   CL 103 11/25/2011 0913   CO2 23 11/25/2011 0913   BUN 8 11/25/2011 0913   CREATININE 0.84 11/25/2011 0913      Component Value Date/Time   CALCIUM 9.2 11/25/2011 0913   ALKPHOS 78 11/25/2011 0913   AST  14 11/25/2011 0913   ALT 11 11/25/2011 0913   BILITOT 0.5 11/25/2011 0913       RADIOGRAPHIC STUDIES: No results found.  ASSESSMENT/PLAN: This is a very pleasant 42 years old [place] American male with reactive polycythemia currently on observation and phlebotomy on as needed basis. The patient is doing fine but there is elevation of his hemoglobin and hematocrit. His hematocrit is 50.0% today and he will require therapeutic phlebotomy. The patient was discussed with Dr. Gwenyth Bouillon. We will arrange for the patient to have his therapeutic phlebotomy within the next week. He'll return in 4 weeks from his phlebotomy date for another symptom management visit and  repeat CBC differential and C. met and phlebotomy appointment in the event that it is needed.  Laural Benes, Tarri Guilfoil E, PA-C   All questions were answered. The patient knows to call the clinic with any problems, questions or concerns. We can certainly see the patient much sooner if necessary.

## 2012-01-06 ENCOUNTER — Ambulatory Visit (HOSPITAL_BASED_OUTPATIENT_CLINIC_OR_DEPARTMENT_OTHER): Payer: BC Managed Care – PPO

## 2012-01-06 DIAGNOSIS — D45 Polycythemia vera: Secondary | ICD-10-CM

## 2012-01-06 DIAGNOSIS — D751 Secondary polycythemia: Secondary | ICD-10-CM

## 2012-01-06 NOTE — Progress Notes (Signed)
[id number] Completed phlebotomy to right ac without difficulty and withdrew 528g.  Pt tolerated well, eating snacks/drink provided. 1015 Pt declined to stay for the observation; pt states "I'm fine, not dizzy at all" Vitals stable and pt verbalized he would call if any problems.

## 2012-01-06 NOTE — Patient Instructions (Signed)

## 2012-01-18 ENCOUNTER — Other Ambulatory Visit: Payer: Self-pay | Admitting: *Deleted

## 2012-01-27 ENCOUNTER — Telehealth: Payer: Self-pay | Admitting: *Deleted

## 2012-01-27 ENCOUNTER — Other Ambulatory Visit (HOSPITAL_BASED_OUTPATIENT_CLINIC_OR_DEPARTMENT_OTHER): Payer: BC Managed Care – PPO | Admitting: Lab

## 2012-01-27 ENCOUNTER — Ambulatory Visit (HOSPITAL_BASED_OUTPATIENT_CLINIC_OR_DEPARTMENT_OTHER): Payer: BC Managed Care – PPO

## 2012-01-27 ENCOUNTER — Encounter: Payer: Self-pay | Admitting: Physician Assistant

## 2012-01-27 ENCOUNTER — Ambulatory Visit (HOSPITAL_BASED_OUTPATIENT_CLINIC_OR_DEPARTMENT_OTHER): Payer: BC Managed Care – PPO | Admitting: Physician Assistant

## 2012-01-27 VITALS — BP 145/85 | HR 88 | Resp 20 | Ht 69.0 in | Wt 191.0 lb

## 2012-01-27 DIAGNOSIS — D751 Secondary polycythemia: Secondary | ICD-10-CM

## 2012-01-27 DIAGNOSIS — D45 Polycythemia vera: Secondary | ICD-10-CM

## 2012-01-27 DIAGNOSIS — R6884 Jaw pain: Secondary | ICD-10-CM

## 2012-01-27 LAB — CBC WITH DIFFERENTIAL/PLATELET
Eosinophils Absolute: 0.3 10*3/uL (ref 0.0–0.5)
MONO#: 0.4 10*3/uL (ref 0.1–0.9)
NEUT#: 2.5 10*3/uL (ref 1.5–6.5)
RBC: 5.54 10*6/uL (ref 4.20–5.82)
RDW: 14.8 % — ABNORMAL HIGH (ref 11.0–14.6)
WBC: 5.2 10*3/uL (ref 4.0–10.3)

## 2012-01-27 NOTE — Progress Notes (Signed)
Removed 500cc blood via phlebotomy; patient tolerated well; 8 oz juice during 30 minute post observation

## 2012-01-27 NOTE — Patient Instructions (Signed)
Patient aware of next appointment; discharged home with no complaints. 

## 2012-01-27 NOTE — Telephone Encounter (Signed)
Gave patient appointment for 05-11-2012 lab md and phlem.

## 2012-01-27 NOTE — Progress Notes (Signed)
[facility] [phone]   Fax:([phone]  OFFICE PROGRESS NOTE  Carrie Mew, MD [address] [place] [address]  DIAGNOSIS: Polycythemia most likely reactive in nature.   PRIOR THERAPY: None   CURRENT THERAPY: Phlebotomy on as needed basis.   INTERVAL HISTORY: Gabriel Hamilton 41 y.o. male returns to the clinic today for followup visit. The patient is feeling fine today with no specific complaints.reports that he is to [address] no longer has some issues with mouth/jaw pain. He has had a lot of family stress recently. His father was hospitalized with blood sugars over 600 and is now in a rehabilitation facility. He seems to be recuperating well but this is been an added stress for the patient.He denied having any significant weight loss or night sweats. He has no chest pain or shortness of breath, no cough or hemoptysis. The patient had a repeat CBC performed earlier today and he is here for evaluation and discussion of his lab results.  MEDICAL HISTORY: Past Medical History  Diagnosis Date  . Diabetes mellitus   . Acne   . GERD (gastroesophageal reflux disease)   . Hypertension   . Heart murmur     ALLERGIES:   has no known allergies.  MEDICATIONS:  Current Outpatient Prescriptions  Medication Sig Dispense Refill  . ampicillin (PRINCIPEN) 500 MG capsule       . benazepril (LOTENSIN) 20 MG tablet Take 1 tablet (20 mg total) by mouth daily.  30 tablet  11  . glimepiride (AMARYL) 4 MG tablet TAKE 1 TABLET BY MOUTH DAILY BEFORE BREAKFAST.  30 tablet  10  . glucose blood (ACCU-CHEK AVIVA) test strip Use as directed  50 each  11  . glucose blood test strip 1 each by Other route as needed. Use as instructed       . KOMBIGLYZE XR 2.10-998 MG TB24 TAKE TWO TABLETS BY MOUTH DAILY  60 tablet  2  . omeprazole (PRILOSEC) 20 MG capsule Take 20 mg by mouth daily.        REVIEW OF SYSTEMS:  Pertinent items are noted in HPI.    PHYSICAL EXAMINATION: General appearance: alert, cooperative and no distress Neck: no adenopathy Lymph nodes: Cervical, supraclavicular, and axillary nodes normal. Resp: clear to auscultation bilaterally Cardio: regular rate and rhythm, S1, S2 normal, no murmur, click, rub or gallop GI: soft, non-tender; bowel sounds normal; no masses,  no organomegaly Extremities: extremities normal, atraumatic, no cyanosis or edema Neurologic: Alert and oriented X 3, normal strength and tone. Normal symmetric reflexes. Normal coordination and gait  ECOG PERFORMANCE STATUS: 0 - Asymptomatic  Blood pressure 145/85, pulse 88, resp. rate 20, height 5\' 9"  (1.753 m), weight 191 lb (86.637 kg).  LABORATORY DATA: Lab Results  Component Value Date   WBC 5.2 01/27/2012   HGB 15.3 01/27/2012   HCT 46.8 01/27/2012   MCV 84.6 01/27/2012   PLT 289 01/27/2012      Chemistry      Component Value Date/Time   NA 137 11/25/2011 0913   K 4.3 11/25/2011 0913   CL 103 11/25/2011 0913   CO2 23 11/25/2011 0913   BUN 8 11/25/2011 0913   CREATININE 0.84 11/25/2011 0913      Component Value Date/Time   CALCIUM 9.2 11/25/2011 0913   ALKPHOS 78 11/25/2011 0913   AST 14 11/25/2011 0913   ALT 11 11/25/2011 0913   BILITOT 0.5 11/25/2011 0913  RADIOGRAPHIC STUDIES: No results found.  ASSESSMENT/PLAN: This is a very pleasant 41 years old [place] American male with reactive polycythemia currently on observation and phlebotomy on as needed basis. The patient is doing fine but there is elevation of his hemoglobin and hematocrit. His hematocrit is 46.8% today and he will require therapeutic phlebotomy. The patient was discussed with Dr. Gwenyth Bouillon. We will arrange for him to have a monthly CBC and phlebotomy appointment if needed. The goal is to keep his hematocrit around 45%. He will followup with Dr. Arbutus Ped in 3 months with a repeat CBC differential and phlebotomy appointment if needed.   Gabriel Hamilton, Gabriel Hogue E, PA-C   All questions were  answered. The patient knows to call the clinic with any problems, questions or concerns. We can certainly see the patient much sooner if necessary.

## 2012-03-24 ENCOUNTER — Telehealth: Payer: Self-pay | Admitting: Internal Medicine

## 2012-03-24 NOTE — Telephone Encounter (Signed)
Pt called and req to get test strips for an Accu-chek Nano. Pt uses [facility] on [address]. Pt has a coupon to get a free meter and he needs test strips for it.

## 2012-03-25 ENCOUNTER — Other Ambulatory Visit: Payer: Self-pay | Admitting: *Deleted

## 2012-03-25 ENCOUNTER — Ambulatory Visit (INDEPENDENT_AMBULATORY_CARE_PROVIDER_SITE_OTHER): Payer: BC Managed Care – PPO | Admitting: Family Medicine

## 2012-03-25 VITALS — BP 116/81 | HR 98 | Temp 98.6°F | Resp 18 | Ht 69.0 in | Wt 190.0 lb

## 2012-03-25 DIAGNOSIS — T783XXA Angioneurotic edema, initial encounter: Secondary | ICD-10-CM

## 2012-03-25 DIAGNOSIS — E119 Type 2 diabetes mellitus without complications: Secondary | ICD-10-CM

## 2012-03-25 LAB — POCT GLYCOSYLATED HEMOGLOBIN (HGB A1C): Hemoglobin A1C: 8.6

## 2012-03-25 MED ORDER — HYDROCHLOROTHIAZIDE 12.5 MG PO TABS: 12.5000 mg | ORAL_TABLET | Freq: Every day | ORAL | Status: DC

## 2012-03-25 MED ORDER — PREDNISONE 20 MG PO TABS: ORAL_TABLET | ORAL | Status: DC

## 2012-03-25 MED ORDER — NON FORMULARY
Freq: Once | Status: AC
  Administered 2012-03-25: 09:00:00 via ORAL

## 2012-03-25 MED ORDER — EPINEPHRINE 0.3 MG/0.3ML IJ DEVI: 0.3000 mg | Freq: Once | INTRAMUSCULAR | Status: DC

## 2012-03-25 NOTE — Patient Instructions (Addendum)
Stop taking the benazepril (and all other ACE-I or ace- inhibitor medications in the future). This could be the cause of your lip swelling.  For now we will use HCTZ to control your blood pressure.  Dr. Lovell Sheehan may decide to try an ARB class of blood pressure medication in the future.    Go right to the drug store to get your medications.  We want you to start on the prednisone as soon as you can.  As soon as you know that you will not need to drive or operate machinery, take the 2 benadryl pills. You can take 1 or 2 benadryl pills every 6 hours as needed.  It is also a good idea to take a Claritin or zyrtec, as well as a zantac daily for the next few days.  If you have ANY sign of worsening let us know or seek care at the ED.  If you have any swelling of the inside of your mouth seek care.  If you have difficulty breathing call 911 and use your epi- pen.  I would advise you to have someone keep an eye on you for the next 24- 48 hours so there is someone to help if you get worse.        Angioedema Angioedema (AE) is a sudden swelling of the eyelids, lips, lobes of ears, external genitalia, skin, and other parts of the body. AE can happen by itself. It usually begins during the night and is found on awakening. It can happen with hives and other allergic reactions. Attacks can be mild and annoying, or life-threatening if the air passages swell. AE generally occurs in a short time period (over minutes to hours) and gets better in 24 to 48 hours. It usually does not cause any serious problems.  There are 2 different kinds of AE:   Allergic AE.  Nonallergic AE.  There may be an overreaction or direct stimulation of cells that are a part of the immune system (mast cells).  There may be problems with the release of chemicals made by the body that cause swelling and inflammation (kinins). AE due to kinins can be inherited from parents (hereditary), or it can develop on its own (acquired). Acquired AE either  shows up before, or along with, certain diseases or is due to the body's immune system attacking parts of the body's own cells (autoimmune). CAUSES  Allergic  AE due to allergic reactions are caused by something that causes the body to react (trigger). Common triggers include:  Foods.  Medicines.  Latex.  Direct contact with certain fruits, vegetables, or animal saliva.  Insect stings. Nonallergic  Mast cell stimulation may be caused by:  Medicines.  Dyes used in X-rays.  The body's own immune system reactions to parts of the body (autoimmune disease).  Possibly, some virus infections.  AE due to problems with kinins can be hereditary or acquired. Attacks are triggered by:  Mild injury.  Dental work or any surgery.  Stress.  Sudden changes in temperature.  Exercise.  Medicines.  AE due to problems with kinins can also be due to certain medicines, especially blood pressure medicines like angiotensin-converting enzyme (ACE) inhibitors. African Americans are at nearly 5 times greater risk of developing AE than Caucasians from ACE inhibitors. SYMPTOMS  Allergic symptoms:  Non-itchy swelling of the skin. Often the swelling is on the face and lips, but any area of the skin can swell. Sometimes, the swelling can be painful. If hives are present, there  is intense itching.  Breathing problems if the air passages swell. Nonallergic symptoms:  If internal organs are involved, there may be:  Nausea.  Abdominal pain.  Vomiting.  Difficulty swallowing.  Difficulty passing urine.  Breathing problems if the air passages swell. Depending on the cause of AE, episodes may:  Only happen once (if triggers are removed or avoided).  Come back in unpredictable patterns.  Repeat for several years and then gradually fade away. DIAGNOSIS  AE is diagnosed by:   Asking questions to find out how fast the symptoms began.  Taking a family history.  Physical  exam.  Diagnostic tests. Tests could include:  Allergy skin tests to see if the problem is allergic.  Blood tests to diagnose hereditary and some acquired types of AE.  Other tests to see if there is a hidden disease leading to the AE. TREATMENT  Treatment depends on the type and cause (if any) of the AE. Allergic  Allergic types of AE are treated with:  Immediate removal of the trigger or medicine (if any).  Epinephrine injection.  Steroids.  Antihistamines.  Hospitalization for severe attacks. Nonallergic  Mast cell stimulation types of AE are treated with:  Immediate removal of the trigger or medicine (if any).  Epinephrine injection.  Steroids.  Antihistamines.  Hospitalization for severe attacks.  Hereditary AE is treated with:  Medicines to prevent and treat attacks. There is little response to antihistamines, epinephrine, or steroids.  Preventive medicines before dental work or surgery.  Removing or avoiding medicines that trigger attacks.  Hospitalization for severe attacks.  Acquired AE is treated with:  Treating underlying disease (if any).  Medicines to prevent and treat attacks. HOME CARE INSTRUCTIONS   Always carry your emergency allergy treatment medicines with you.  Wear a medical bracelet.  Avoid known triggers. SEEK MEDICAL CARE IF:   You get repeat attacks.  Your attacks are more frequent or more severe despite preventive measures.  You have hereditary AE and are considering having children. It is important to discuss the risks of passing this on to your children. SEEK IMMEDIATE MEDICAL CARE IF:   You have difficulty breathing.  You have difficulty swallowing.  You experience fainting. This condition should be treated immediately. It can be life-threatening if it involves throat swelling. Document Released: 08/17/2001 Document Revised: 08/31/2011 Document Reviewed: 06/07/2008 [facility] Patient Information 2013 [place],  [place].

## 2012-03-25 NOTE — Telephone Encounter (Signed)
Called in and sent e scribe

## 2012-03-25 NOTE — Progress Notes (Signed)
[facility] [address], [place] [address] [phone]  Date:  03/25/2012   Name:  Gabriel Hamilton   DOB:  06-04-1971   MRN:  [id number]  PCP:  Carrie Mew, MD    Chief Complaint: Allergic Reaction   History of Present Illness:  Gabriel Hamilton is a 41 y.o. very pleasant male patient who presents with the following:  He noticed that his upper lip was swollen this am.  It is not painful- just makes his lip feel dry.  He does not have any tongue swelling, and he is able to breathe ok.  He noted the swelling around 6:30 this am- it has not gotten any worse.  He does not have any hives.  He has never had this in the past.    He just started back on his benazepril a month or two ago- he was off of it for several months.   He is also taking ampicillin, but he has been on this for many years for acne.   No other new medications.   No new foods.   Glucose 186 this morning.    Patient Active Problem List  Diagnosis  . DIABETES MELLITUS, TYPE II  . HYPOGONADISM  . HYPERTENSION, MODERATE  . GERD  . HEART MURMUR  . Elbow joint pain  . Polycythemia  . Nicotine addiction    Past Medical History  Diagnosis Date  . Diabetes mellitus   . Acne   . GERD (gastroesophageal reflux disease)   . Hypertension   . Heart murmur     No past surgical history on file.  History  Substance Use Topics  . Smoking status: Current Every Day Smoker -- 0.3 packs/day    Types: Cigarettes  . Smokeless tobacco: Not on file  . Alcohol Use: Yes    Family History  Problem Relation Age of Onset  . Arthritis Mother   . Diabetes Father   . Heart disease Father   . Hyperlipidemia Father   . Hypertension Father     No Known Allergies  Medication list has been reviewed and updated.  Current Outpatient Prescriptions on File Prior to Visit  Medication Sig Dispense Refill  . ampicillin (PRINCIPEN) 500 MG capsule       . benazepril (LOTENSIN) 20 MG tablet Take 1  tablet (20 mg total) by mouth daily.  30 tablet  11  . glimepiride (AMARYL) 4 MG tablet TAKE 1 TABLET BY MOUTH DAILY BEFORE BREAKFAST.  30 tablet  10  . glucose blood (ACCU-CHEK AVIVA) test strip Use as directed  50 each  11  . glucose blood test strip 1 each by Other route as needed. Use as instructed       . KOMBIGLYZE XR 2.10-998 MG TB24 TAKE TWO TABLETS BY MOUTH DAILY  60 tablet  2  . omeprazole (PRILOSEC) 20 MG capsule Take 20 mg by mouth daily.        Review of Systems:  As per HPI- otherwise negative.   Physical Examination: Filed Vitals:   03/25/12 0812  BP: 116/81  Pulse: 98  Temp: 98.6 F (37 C)  Resp: 18   Filed Vitals:   03/25/12 0812  Height: 5\' 9"  (1.753 m)  Weight: 190 lb (86.183 kg)   Body mass index is 28.06 kg/(m^2). Ideal Body Weight: Weight in (lb) to have BMI = 25: 168.9   GEN: WDWN, NAD, Non-toxic, A & O x 3 HEENT: Atraumatic, Normocephalic. Neck supple. No  masses, No LAD. Bilateral TM wnl, oropharynx normal.  PEERL,EOMI.   Upper lip is slightly edematous- no other swelling, no swelling of lower lip, palate or tongue.  Ears and Nose: No external deformity. CV: RRR, No M/G/R. No JVD. No thrill. No extra heart sounds. PULM: CTA B, no wheezes, crackles, rhonchi. No retractions. No resp. distress. No accessory muscle use. ABD: S, NT, ND, +BS. No rebound. No HSM. EXTR: No c/c/e.  No hives on skin NEURO Normal gait.  PSYCH: Normally interactive. Conversant. Not depressed or anxious appearing.  Calm demeanor.   Given zyrtec and zantac, drew A1c for patient.  He was given 50mg  of PO benadryl to go Results for orders placed in visit on 03/25/12  POCT GLYCOSYLATED HEMOGLOBIN (HGB A1C)      Component Value Range   Hemoglobin A1C 8.6     Assessment and Plan: 1. Angioedema  NON FORMULARY, NON FORMULARY, predniSONE (DELTASONE) 20 MG tablet, EPINEPHrine (EPIPEN) 0.3 mg/0.3 mL DEVI, hydrochlorothiazide (HYDRODIURIL) 12.5 MG tablet  2. Diabetes mellitus type II   POCT glycosylated hemoglobin (Hb A1C), NON FORMULARY, NON FORMULARY   Angioedema- may be due to ACE- O use.  Counseled extensively about self- care and indicators for follow- up.  See pt instructions for more. He was observed at clinic for more than an hour and re- examined prior to his discharge home- no change.    Abbe Amsterdam, MD

## 2012-03-25 NOTE — Telephone Encounter (Signed)
Called to pharmacy unable to send e chart

## 2012-05-04 ENCOUNTER — Telehealth: Payer: Self-pay | Admitting: Internal Medicine

## 2012-05-04 NOTE — Telephone Encounter (Signed)
called and l/m with new appt on 11/27 as dr mkm is out of the office on 11/20.

## 2012-05-11 ENCOUNTER — Ambulatory Visit (INDEPENDENT_AMBULATORY_CARE_PROVIDER_SITE_OTHER): Payer: BC Managed Care – PPO | Admitting: Internal Medicine

## 2012-05-11 ENCOUNTER — Other Ambulatory Visit: Payer: BC Managed Care – PPO | Admitting: Lab

## 2012-05-11 ENCOUNTER — Ambulatory Visit: Payer: BC Managed Care – PPO | Admitting: Internal Medicine

## 2012-05-11 VITALS — BP 130/82 | HR 76 | Temp 98.0°F | Resp 16 | Ht 69.0 in | Wt 189.0 lb

## 2012-05-11 DIAGNOSIS — IMO0001 Reserved for inherently not codable concepts without codable children: Secondary | ICD-10-CM

## 2012-05-11 DIAGNOSIS — I10 Essential (primary) hypertension: Secondary | ICD-10-CM

## 2012-05-11 DIAGNOSIS — Z91018 Allergy to other foods: Secondary | ICD-10-CM

## 2012-05-11 DIAGNOSIS — T887XXA Unspecified adverse effect of drug or medicament, initial encounter: Secondary | ICD-10-CM

## 2012-05-11 DIAGNOSIS — T781XXA Other adverse food reactions, not elsewhere classified, initial encounter: Secondary | ICD-10-CM

## 2012-05-11 LAB — BASIC METABOLIC PANEL
BUN: 12 mg/dL (ref 6–23)
CO2: 33 mEq/L — ABNORMAL HIGH (ref 19–32)
Calcium: 10.1 mg/dL (ref 8.4–10.5)
Chloride: 94 mEq/L — ABNORMAL LOW (ref 96–112)
Creatinine, Ser: 0.9 mg/dL (ref 0.4–1.5)

## 2012-05-11 LAB — CBC WITH DIFFERENTIAL/PLATELET
Basophils Absolute: 0 10*3/uL (ref 0.0–0.1)
Basophils Relative: 0.2 % (ref 0.0–3.0)
Eosinophils Absolute: 0.2 10*3/uL (ref 0.0–0.7)
Lymphocytes Relative: 22.9 % (ref 12.0–46.0)
MCHC: 32.4 g/dL (ref 30.0–36.0)
MCV: 83.2 fl (ref 78.0–100.0)
Monocytes Absolute: 0.6 10*3/uL (ref 0.1–1.0)
Neutrophils Relative %: 67.8 % (ref 43.0–77.0)
Platelets: 334 10*3/uL (ref 150.0–400.0)
RBC: 6.14 Mil/uL — ABNORMAL HIGH (ref 4.22–5.81)

## 2012-05-11 LAB — HEMOGLOBIN A1C: Hgb A1c MFr Bld: 10.9 % — ABNORMAL HIGH (ref 4.6–6.5)

## 2012-05-11 MED ORDER — OLMESARTAN MEDOXOMIL 20 MG PO TABS: 20.0000 mg | ORAL_TABLET | Freq: Every day | ORAL | Status: DC

## 2012-05-18 ENCOUNTER — Telehealth: Payer: Self-pay | Admitting: Internal Medicine

## 2012-05-18 ENCOUNTER — Telehealth: Payer: Self-pay | Admitting: *Deleted

## 2012-05-18 ENCOUNTER — Other Ambulatory Visit: Payer: Self-pay

## 2012-05-18 ENCOUNTER — Ambulatory Visit: Payer: Self-pay | Admitting: Internal Medicine

## 2012-05-18 NOTE — Telephone Encounter (Signed)
Per staff message I have moved appt from today to 12/11.  JMW

## 2012-05-18 NOTE — Telephone Encounter (Signed)
pt called to r/s his 11/27 appt to 12/11     aom

## 2012-06-01 ENCOUNTER — Ambulatory Visit: Payer: Self-pay | Admitting: Internal Medicine

## 2012-06-01 ENCOUNTER — Other Ambulatory Visit: Payer: Self-pay | Admitting: Lab

## 2012-07-03 ENCOUNTER — Encounter: Payer: Self-pay | Admitting: Internal Medicine

## 2012-07-03 NOTE — Progress Notes (Signed)
  Subjective:    Patient ID: Gabriel Hamilton, male    DOB: 11/03/1970, 42 y.o.   MRN: [id number]  HPI  Gabriel Hamilton to urgent care fro drug vs food allergic reaction. Possible ARB side effect but did not stop drug AODM with family HX Poor DM control hx  Review of Systems  Constitutional: Negative for fever and fatigue.  HENT: Negative for hearing loss, congestion, neck pain and postnasal drip.   Eyes: Negative for discharge, redness and visual disturbance.  Respiratory: Negative for cough, shortness of breath and wheezing.   Cardiovascular: Negative for leg swelling.  Gastrointestinal: Negative for abdominal pain, constipation and abdominal distention.  Genitourinary: Negative for urgency and frequency.  Musculoskeletal: Negative for joint swelling and arthralgias.  Skin: Negative for color change and rash.  Neurological: Negative for weakness and light-headedness.  Hematological: Negative for adenopathy.  Psychiatric/Behavioral: Negative for behavioral problems.       Objective:   Physical Exam  Nursing note and vitals reviewed. Constitutional: He appears well-developed and well-nourished.  HENT:  Head: Normocephalic and atraumatic.  Eyes: Conjunctivae normal are normal. Pupils are equal, round, and reactive to light.  Neck: Normal range of motion. Neck supple.  Cardiovascular: Normal rate and regular rhythm.   Pulmonary/Chest: Effort normal and breath sounds normal.  Abdominal: Soft. Bowel sounds are normal.          Assessment & Plan:  No recurrent angioedema reviewed need for diet and weight controll i suspect that allergic reaction a food allergy and proposed food allergy testing

## 2012-07-03 NOTE — Patient Instructions (Signed)
The patient is instructed to continue all medications as prescribed. Schedule followup with check out clerk upon leaving the clinic  

## 2012-08-17 ENCOUNTER — Ambulatory Visit: Payer: BC Managed Care – PPO | Admitting: Internal Medicine

## 2012-09-08 ENCOUNTER — Telehealth: Payer: Self-pay | Admitting: Internal Medicine

## 2012-09-08 NOTE — Telephone Encounter (Signed)
, °

## 2012-09-09 ENCOUNTER — Other Ambulatory Visit: Payer: Self-pay | Admitting: Internal Medicine

## 2012-09-13 ENCOUNTER — Telehealth: Payer: Self-pay | Admitting: Internal Medicine

## 2012-09-13 NOTE — Telephone Encounter (Signed)
called pt with resch appt  date and time...sp

## 2012-09-14 ENCOUNTER — Ambulatory Visit: Payer: Self-pay | Admitting: Internal Medicine

## 2012-09-14 ENCOUNTER — Other Ambulatory Visit: Payer: Self-pay | Admitting: Lab

## 2012-09-22 ENCOUNTER — Telehealth: Payer: Self-pay | Admitting: Internal Medicine

## 2012-09-28 ENCOUNTER — Ambulatory Visit: Payer: Self-pay | Admitting: Internal Medicine

## 2012-09-28 ENCOUNTER — Other Ambulatory Visit: Payer: Self-pay | Admitting: Lab

## 2012-10-12 ENCOUNTER — Telehealth: Payer: Self-pay | Admitting: Internal Medicine

## 2012-10-12 ENCOUNTER — Ambulatory Visit: Payer: Self-pay | Admitting: Internal Medicine

## 2012-10-12 ENCOUNTER — Other Ambulatory Visit: Payer: Self-pay

## 2012-10-12 NOTE — Telephone Encounter (Signed)
returned pt msg and advised to call back when and advised when he could r/s missed appts.Marland KitchenMarland KitchenMarland Kitchen

## 2012-10-13 ENCOUNTER — Telehealth: Payer: Self-pay | Admitting: Internal Medicine

## 2012-10-13 DIAGNOSIS — I10 Essential (primary) hypertension: Secondary | ICD-10-CM

## 2012-10-13 MED ORDER — OLMESARTAN MEDOXOMIL 20 MG PO TABS: 20.0000 mg | ORAL_TABLET | Freq: Every day | ORAL | Status: DC

## 2012-10-13 MED ORDER — SAXAGLIPTIN-METFORMIN ER 2.5-1000 MG PO TB24: ORAL_TABLET | ORAL | Status: DC

## 2012-10-13 NOTE — Telephone Encounter (Signed)
rx sent in electronically, no samples, pt aware

## 2012-10-13 NOTE — Telephone Encounter (Signed)
duplicate

## 2012-10-13 NOTE — Telephone Encounter (Signed)
Caller: Erinn/Patient; Phone: [phone]; Reason for Call: Patient calling about renewal or bridge prescription for benicar and kombiglyze.  Would like samples if possible.  Uses [facility].  Has appt 10/26/12 but has run out of medication.  States appt was moved back by office due to Dr.  Lovell Sheehan not being in office on original appt date.  Info to office for provider review/Rx/callback.  May reach patient at [phone].  Krs/can

## 2012-10-13 NOTE — Telephone Encounter (Signed)
Caller: Deuntae/Patient; Phone: [phone]; Reason for Call: Patient calling in follow up to request for refill/samples of benicar and kombiglyze.  Called 10/13/12 1337 and has not heard back from staff.  Per Epic, no new medications called in; info to office for provider review/Rx/callback.  May reach patient at [phone].  Krs/can

## 2012-10-21 ENCOUNTER — Telehealth: Payer: Self-pay | Admitting: Internal Medicine

## 2012-10-21 NOTE — Telephone Encounter (Signed)
pt called to r/s missed appt.Gabriel KitchenMarland KitchenDone...emailed MB to add tx

## 2012-10-25 ENCOUNTER — Telehealth: Payer: Self-pay | Admitting: Internal Medicine

## 2012-10-25 NOTE — Telephone Encounter (Signed)
S/w pt in re to appt d/t change to 05/21 @ 8:15 w/Dr. Walden Field. New Calendar mailed.

## 2012-10-26 ENCOUNTER — Ambulatory Visit: Payer: BC Managed Care – PPO | Admitting: Internal Medicine

## 2012-10-26 ENCOUNTER — Other Ambulatory Visit: Payer: BC Managed Care – PPO | Admitting: Lab

## 2012-10-26 ENCOUNTER — Ambulatory Visit (INDEPENDENT_AMBULATORY_CARE_PROVIDER_SITE_OTHER): Payer: BC Managed Care – PPO | Admitting: Internal Medicine

## 2012-10-26 ENCOUNTER — Encounter: Payer: Self-pay | Admitting: Internal Medicine

## 2012-10-26 VITALS — BP 136/80 | HR 72 | Temp 98.6°F | Resp 16 | Ht 68.5 in | Wt 190.0 lb

## 2012-10-26 DIAGNOSIS — D45 Polycythemia vera: Secondary | ICD-10-CM

## 2012-10-26 DIAGNOSIS — R011 Cardiac murmur, unspecified: Secondary | ICD-10-CM

## 2012-10-26 DIAGNOSIS — I10 Essential (primary) hypertension: Secondary | ICD-10-CM

## 2012-10-26 DIAGNOSIS — D751 Secondary polycythemia: Secondary | ICD-10-CM

## 2012-10-26 DIAGNOSIS — E119 Type 2 diabetes mellitus without complications: Secondary | ICD-10-CM

## 2012-10-26 MED ORDER — OLMESARTAN MEDOXOMIL 20 MG PO TABS: 20.0000 mg | ORAL_TABLET | Freq: Every day | ORAL | Status: DC

## 2012-10-26 MED ORDER — LIRAGLUTIDE 18 MG/3ML ~~LOC~~ SOPN: 1.2000 mg | PEN_INJECTOR | Freq: Every day | SUBCUTANEOUS | Status: DC

## 2012-10-26 NOTE — Progress Notes (Signed)
Subjective:    Patient ID: Gabriel Hamilton, male    DOB: 10/22/1970, 42 y.o.   MRN: [id number]  HPI  Poorly controled DM Using vinegar soaks for fungal nails HTN stable Reactive polycythemia   Review of Systems  Constitutional: Negative for fever and fatigue.  HENT: Negative for hearing loss, congestion, neck pain and postnasal drip.   Eyes: Negative for discharge, redness and visual disturbance.  Respiratory: Negative for cough, shortness of breath and wheezing.   Cardiovascular: Negative for leg swelling.  Gastrointestinal: Negative for abdominal pain, constipation and abdominal distention.  Genitourinary: Negative for urgency and frequency.  Musculoskeletal: Negative for joint swelling and arthralgias.  Skin: Negative for color change and rash.  Neurological: Negative for weakness and light-headedness.  Hematological: Negative for adenopathy.  Psychiatric/Behavioral: Negative for behavioral problems.   Past Medical History  Diagnosis Date  . Diabetes mellitus   . Acne   . GERD (gastroesophageal reflux disease)   . Hypertension   . Heart murmur     History   Social History  . Marital Status: Single    Spouse Name: N/A    Number of Children: N/A  . Years of Education: N/A   Occupational History  . Not on file.   Social History Main Topics  . Smoking status: Current Every Day Smoker -- 0.30 packs/day    Types: Cigarettes  . Smokeless tobacco: Not on file  . Alcohol Use: Yes  . Drug Use: No  . Sexually Active: Yes   Other Topics Concern  . Not on file   Social History Narrative  . No narrative on file    No past surgical history on file.  Family History  Problem Relation Age of Onset  . Arthritis Mother   . Diabetes Father   . Heart disease Father   . Hyperlipidemia Father   . Hypertension Father     Allergies  Allergen Reactions  . Benazepril     May have caused angioedema    Current Outpatient Prescriptions on File Prior to Visit   Medication Sig Dispense Refill  . ampicillin (PRINCIPEN) 500 MG capsule       . EPINEPHrine (EPIPEN) 0.3 mg/0.3 mL DEVI Inject 0.3 mLs (0.3 mg total) into the muscle once.  1 Device  2  . glimepiride (AMARYL) 4 MG tablet TAKE 1 TABLET BY MOUTH DAILY BEFORE BREAKFAST.  30 tablet  9  . glucose blood (ACCU-CHEK AVIVA) test strip Use as directed  50 each  11  . glucose blood test strip 1 each by Other route as needed. Use as instructed       . olmesartan (BENICAR) 20 MG tablet Take 1 tablet (20 mg total) by mouth daily.  30 tablet  11  . Saxagliptin-Metformin (KOMBIGLYZE XR) 2.10-998 MG TB24 TAKE TWO TABLETS BY MOUTH DAILY  60 tablet  2   No current facility-administered medications on file prior to visit.    BP 136/80  Pulse 72  Temp(Src) 98.6 F (37 C)  Resp 16  Ht 5' 8.5" (1.74 m)  Wt 190 lb (86.183 kg)  BMI 28.47 kg/m2       Objective:   Physical Exam  Vitals reviewed. Constitutional: He appears well-developed and well-nourished.  HENT:  Head: Normocephalic and atraumatic.  Eyes: Conjunctivae are normal. Pupils are equal, round, and reactive to light.  Neck: Normal range of motion. Neck supple.  Cardiovascular: Normal rate and regular rhythm.   Pulmonary/Chest: Effort normal and breath sounds normal.  Abdominal: Soft.  Bowel sounds are normal.          Assessment & Plan:  Of an injectable.  Will start the patient on victoza 1.2 mg subcutaneous daily.  Spent 30 minutes face-to-face explaining the use of the injectable and teaching the injection at the abdomen site

## 2012-10-26 NOTE — Patient Instructions (Signed)
1.2 mg of victoza daily

## 2012-11-01 ENCOUNTER — Other Ambulatory Visit: Payer: Self-pay | Admitting: *Deleted

## 2012-11-01 ENCOUNTER — Telehealth: Payer: Self-pay | Admitting: Internal Medicine

## 2012-11-01 NOTE — Telephone Encounter (Signed)
Disregard response by b,Gwynne Kemnitz.  This was document on wrong chart in error

## 2012-11-01 NOTE — Telephone Encounter (Signed)
Talked with pt and informed no available appointment with dr Lovell Sheehan until august, but can see another physician-, or since dr Claire Shown is her ortho,she could go see him.  Didn't want to do that.c

## 2012-11-01 NOTE — Telephone Encounter (Signed)
Pt's prior auth on Benicar denied. Per insurance, Muhanad must try one of the following, singularly, or with HCTZ: Candesartan, irbesartan, valsartan, losartan, eprosartan

## 2012-11-02 ENCOUNTER — Other Ambulatory Visit: Payer: Self-pay | Admitting: *Deleted

## 2012-11-02 MED ORDER — IRBESARTAN 150 MG PO TABS: 150.0000 mg | ORAL_TABLET | Freq: Every day | ORAL | Status: DC

## 2012-11-02 NOTE — Telephone Encounter (Signed)
Dr Lovell Sheehan notified that pt has allergies to benaZepril-dr jenkins responds by saying it from cough and he can take arb.

## 2012-11-08 ENCOUNTER — Telehealth: Payer: Self-pay | Admitting: Internal Medicine

## 2012-11-08 NOTE — Telephone Encounter (Signed)
pt had to r/s due to work sched.Marland KitchenMarland KitchenMarland KitchenDone....emailed MW to move tx

## 2012-11-09 ENCOUNTER — Ambulatory Visit: Payer: BC Managed Care – PPO | Admitting: Internal Medicine

## 2012-11-09 ENCOUNTER — Other Ambulatory Visit: Payer: BC Managed Care – PPO

## 2012-11-23 ENCOUNTER — Telehealth: Payer: Self-pay | Admitting: *Deleted

## 2012-11-23 ENCOUNTER — Ambulatory Visit: Payer: BC Managed Care – PPO | Admitting: Internal Medicine

## 2012-11-23 ENCOUNTER — Telehealth: Payer: Self-pay | Admitting: Internal Medicine

## 2012-11-23 ENCOUNTER — Other Ambulatory Visit: Payer: BC Managed Care – PPO | Admitting: Lab

## 2012-11-23 NOTE — Telephone Encounter (Signed)
[facility] for several f/u visits.  Per Dr Donnald Garre, msg left with patient to call back if he needs [place] to schedule a f/u visit in the future.  SLJ

## 2012-11-23 NOTE — Telephone Encounter (Signed)
Per staff message I have scheduled appts. JMW  

## 2012-11-23 NOTE — Telephone Encounter (Signed)
s.w. pt and r/s missed appt to 6.18.14 .[name]..emailed MW to r/s missed phlebotomy

## 2012-12-07 ENCOUNTER — Telehealth: Payer: Self-pay | Admitting: Internal Medicine

## 2012-12-07 ENCOUNTER — Encounter: Payer: Self-pay | Admitting: Internal Medicine

## 2012-12-07 ENCOUNTER — Ambulatory Visit (HOSPITAL_BASED_OUTPATIENT_CLINIC_OR_DEPARTMENT_OTHER): Payer: BC Managed Care – PPO | Admitting: Internal Medicine

## 2012-12-07 ENCOUNTER — Other Ambulatory Visit (HOSPITAL_BASED_OUTPATIENT_CLINIC_OR_DEPARTMENT_OTHER): Payer: BC Managed Care – PPO | Admitting: Lab

## 2012-12-07 VITALS — BP 129/87 | HR 81 | Temp 97.6°F | Resp 18 | Ht 68.5 in | Wt 190.3 lb

## 2012-12-07 DIAGNOSIS — D751 Secondary polycythemia: Secondary | ICD-10-CM

## 2012-12-07 DIAGNOSIS — D45 Polycythemia vera: Secondary | ICD-10-CM

## 2012-12-07 LAB — CBC WITH DIFFERENTIAL/PLATELET
Basophils Absolute: 0.1 10*3/uL (ref 0.0–0.1)
EOS%: 4.4 % (ref 0.0–7.0)
Eosinophils Absolute: 0.3 10*3/uL (ref 0.0–0.5)
HCT: 49.9 % (ref 38.4–49.9)
HGB: 16.7 g/dL (ref 13.0–17.1)
MCH: 28.9 pg (ref 27.2–33.4)
MONO#: 0.5 10*3/uL (ref 0.1–0.9)
NEUT%: 50.7 % (ref 39.0–75.0)
lymph#: 2.3 10*3/uL (ref 0.9–3.3)

## 2012-12-07 NOTE — Progress Notes (Signed)
[facility] [phone]   [phone]  OFFICE PROGRESS NOTE  Carrie Mew, MD [address] [place] [address]  DIAGNOSIS: Polycythemia most likely reactive in nature.   PRIOR THERAPY: None   CURRENT THERAPY: Phlebotomy on as needed basis.  INTERVAL HISTORY: Gabriel Hamilton 42 y.o. male returns to the clinic today for followup visit. The patient was last seen in August of 2013. He has been doing fine since that time with no significant complaints. He denied having any significant weight loss or night sweats. He denied having any headache or blurry vision. The patient denied having any significant chest pain, shortness of breath, cough or hemoptysis. He quit smoking for almost 6 weeks but started again. He had repeat CBC performed earlier today and he see for evaluation and discussion of his lab results.  MEDICAL HISTORY: Past Medical History  Diagnosis Date  . Diabetes mellitus   . Acne   . GERD (gastroesophageal reflux disease)   . Hypertension   . Heart murmur     ALLERGIES:  is allergic to benazepril.  MEDICATIONS:  Current Outpatient Prescriptions  Medication Sig Dispense Refill  . ampicillin (PRINCIPEN) 500 MG capsule       . EPINEPHrine (EPIPEN) 0.3 mg/0.3 mL DEVI Inject 0.3 mLs (0.3 mg total) into the muscle once.  1 Device  2  . glimepiride (AMARYL) 4 MG tablet TAKE 1 TABLET BY MOUTH DAILY BEFORE BREAKFAST.  30 tablet  9  . glucose blood (ACCU-CHEK AVIVA) test strip Use as directed  50 each  11  . glucose blood test strip 1 each by Other route as needed. Use as instructed       . irbesartan (AVAPRO) 150 MG tablet Take 1 tablet (150 mg total) by mouth at bedtime.  30 tablet  6  . Liraglutide (VICTOZA) 18 MG/3ML SOPN Inject 1.2 mg into the skin daily.      . Saxagliptin-Metformin (KOMBIGLYZE XR) 2.10-998 MG TB24 TAKE TWO TABLETS BY MOUTH DAILY  60 tablet  2   No current facility-administered medications for this  visit.    REVIEW OF SYSTEMS:  A comprehensive review of systems was negative.   PHYSICAL EXAMINATION: General appearance: alert, cooperative and no distress Head: Normocephalic, without obvious abnormality, atraumatic Neck: no adenopathy Lymph nodes: Cervical, supraclavicular, and axillary nodes normal. Resp: clear to auscultation bilaterally Cardio: regular rate and rhythm, S1, S2 normal, no murmur, click, rub or gallop GI: soft, non-tender; bowel sounds normal; no masses,  no organomegaly Extremities: extremities normal, atraumatic, no cyanosis or edema  ECOG PERFORMANCE STATUS: 0 - Asymptomatic  Blood pressure 129/87, pulse 81, temperature 97.6 F (36.4 C), temperature source Oral, resp. rate 18, height 5' 8.5" (1.74 m), weight 190 lb 4.8 oz (86.32 kg).  LABORATORY DATA: Lab Results  Component Value Date   WBC 6.4 12/07/2012   HGB 16.7 12/07/2012   HCT 49.9 12/07/2012   MCV 86.4 12/07/2012   PLT 293 12/07/2012      Chemistry      Component Value Date/Time   NA 136 05/11/2012 1211   K 4.4 05/11/2012 1211   CL 94* 05/11/2012 1211   CO2 33* 05/11/2012 1211   BUN 12 05/11/2012 1211   CREATININE 0.9 05/11/2012 1211      Component Value Date/Time   CALCIUM 10.1 05/11/2012 1211   ALKPHOS 78 11/25/2011 0913   AST 14 11/25/2011 0913   ALT 11 11/25/2011 0913   BILITOT 0.5  11/25/2011 0913       RADIOGRAPHIC STUDIES: No results found.  ASSESSMENT AND PLAN: This is a very pleasant 42 years old [place] American male with history of polycythemia most likely reactive in nature secondary to smoking. His hemoglobin and hematocrit are stable today but I recommended for him to continue on observation with repeat CBC and LDH in 6 months. I strongly encouraged the patient to quit smoking and offered him to smoke cessation program. He was advised to call immediately if she has any concerning symptoms in the interval  All questions were answered. The patient knows to call the clinic with any  problems, questions or concerns. We can certainly see the patient much sooner if necessary.

## 2012-12-07 NOTE — Patient Instructions (Signed)
Your hemoglobin and hematocrit are stable.  Followup visit in 6 months with repeat CBC and LDH be

## 2012-12-07 NOTE — Telephone Encounter (Signed)
Gave pt appt for December 2014 lab and MD °

## 2013-01-05 ENCOUNTER — Ambulatory Visit (INDEPENDENT_AMBULATORY_CARE_PROVIDER_SITE_OTHER): Payer: BC Managed Care – PPO | Admitting: Family Medicine

## 2013-01-05 ENCOUNTER — Ambulatory Visit: Payer: BC Managed Care – PPO

## 2013-01-05 VITALS — BP 120/82 | HR 79 | Temp 98.0°F | Resp 14 | Wt 190.0 lb

## 2013-01-05 DIAGNOSIS — R05 Cough: Secondary | ICD-10-CM

## 2013-01-05 DIAGNOSIS — M549 Dorsalgia, unspecified: Secondary | ICD-10-CM

## 2013-01-05 DIAGNOSIS — R059 Cough, unspecified: Secondary | ICD-10-CM

## 2013-01-05 DIAGNOSIS — T148XXA Other injury of unspecified body region, initial encounter: Secondary | ICD-10-CM

## 2013-01-05 DIAGNOSIS — IMO0001 Reserved for inherently not codable concepts without codable children: Secondary | ICD-10-CM

## 2013-01-05 LAB — POCT CBC
Granulocyte percent: 59.1 %G (ref 37–80)
HCT, POC: 51.6 % (ref 43.5–53.7)
Hemoglobin: 16.7 g/dL (ref 14.1–18.1)
Lymph, poc: 2.3 (ref 0.6–3.4)
MCH, POC: 29.8 pg (ref 27–31.2)
MCHC: 32.4 g/dL (ref 31.8–35.4)
MCV: 92.1 fL (ref 80–97)
MID (cbc): 0.5 (ref 0–0.9)
MPV: 9.5 fL (ref 0–99.8)
POC Granulocyte: 4 (ref 2–6.9)
POC LYMPH PERCENT: 33.9 %L (ref 10–50)
POC MID %: 7 %M (ref 0–12)
Platelet Count, POC: 305 10*3/uL (ref 142–424)
RBC: 5.6 M/uL (ref 4.69–6.13)
RDW, POC: 15.2 %
WBC: 6.7 10*3/uL (ref 4.6–10.2)

## 2013-01-05 LAB — POCT GLYCOSYLATED HEMOGLOBIN (HGB A1C): Hemoglobin A1C: 7.8

## 2013-01-05 MED ORDER — BENZONATATE 100 MG PO CAPS: 200.0000 mg | ORAL_CAPSULE | Freq: Two times a day (BID) | ORAL | Status: DC | PRN

## 2013-01-05 NOTE — Progress Notes (Signed)
 [facility]:  Office Visit  Chief Complaint:  Chief Complaint  Patient presents with  . Cough    couple days  . Back Pain    HPI: Gabriel Hamilton is a 42 y.o. male who complains of:  1. he has feeling of muscle cramps, pain, body aches all over torso, ankle calf  X 2 days. He has fatigue going on. He was out in the heat a little bit and that made it worse. No fevers, but has had cold sweats. +Cough is clear mucus, no blood no green or yellow sputum. HE recently moved to [place] and there are lots of grass out there. + smoker, No rheumatoid arthritis, no lupus in family. Constant pressure, isolate to right side upper back, on medial side of shoulder blade. Has taken aleve without relief. He recenlty started working at [employer] and is lifting boxes. This problems with his back has been there for many months and in the last 3 days more constant. Usually works itself out but not in last 3 days. He started working at [employer]. No prior back injures/surgeries.   2. His blood sugar was 160 today, last HbA1c? He thinks is was in May at Dr. Lovell Sheehan but he does not remember. Records show that he last got a HbA1c in  Nov 2013  And it was 10.9. No nausea or vomiting.Not taking vicotza since  He gets nauseated. He was feeling horrible. He is Kombilyza and is on Amaryl. Takes them regular. He denies paresthesia. UTD on eye exam.    Past Medical History  Diagnosis Date  . Diabetes mellitus   . Acne   . GERD (gastroesophageal reflux disease)   . Hypertension   . Heart murmur    History reviewed. No pertinent past surgical history. History   Social History  . Marital Status: Single    Spouse Name: N/A    Number of Children: N/A  . Years of Education: N/A   Social History Main Topics  . Smoking status: Current Every Day Smoker -- 0.30 packs/day    Types: Cigarettes  . Smokeless tobacco: None  . Alcohol Use: Yes  . Drug Use: No  . Sexually Active: Yes   Other  Topics Concern  . None   Social History Narrative  . None   Family History  Problem Relation Age of Onset  . Arthritis Mother   . Diabetes Father   . Heart disease Father   . Hyperlipidemia Father   . Hypertension Father    Allergies  Allergen Reactions  . Benazepril     May have caused angioedema   Prior to Admission medications   Medication Sig Start Date End Date Taking? Authorizing Provider  ampicillin (PRINCIPEN) 500 MG capsule  12/11/11  Yes Historical Provider, MD  EPINEPHrine (EPIPEN) 0.3 mg/0.3 mL DEVI Inject 0.3 mLs (0.3 mg total) into the muscle once. 03/25/12  Yes Jessica C Copland, MD  glimepiride (AMARYL) 4 MG tablet TAKE 1 TABLET BY MOUTH DAILY BEFORE BREAKFAST. 09/09/12  Yes Stacie Glaze, MD  glucose blood test strip 1 each by Other route as needed. Use as instructed    Yes Historical Provider, MD  irbesartan (AVAPRO) 150 MG tablet Take 1 tablet (150 mg total) by mouth at bedtime. 11/02/12  Yes Stacie Glaze, MD  Liraglutide (VICTOZA) 18 MG/3ML SOPN Inject 1.2 mg into the skin daily. 10/26/12  Yes Stacie Glaze, MD  Saxagliptin-Metformin (KOMBIGLYZE XR) 2.10-998 MG TB24 TAKE TWO TABLETS  BY MOUTH DAILY 10/13/12  Yes Stacie Glaze, MD     ROS: The patient denies fevers, chills, night sweats, unintentional weight loss, chest pain, palpitations, wheezing, dyspnea on exertion, nausea, vomiting, abdominal pain, dysuria, hematuria, melena, numbness, weakness, or tingling.   All other systems have been reviewed and were otherwise negative with the exception of those mentioned in the HPI and as above.    PHYSICAL EXAM: Filed Vitals:   01/05/13 1852  BP: 120/82  Pulse: 79  Temp: 98 F (36.7 C)  Resp: 14   Filed Vitals:   01/05/13 1852  Weight: 190 lb (86.183 kg)   Body mass index is 28.47 kg/(m^2).  General: Alert, no acute distress HEENT:  Normocephalic, atraumatic, oropharynx patent. No exudates, no sinus tenderness, TM nl. EOMI, fundoscopic exam grossly  nl Cardiovascular:  Regular rate and rhythm, no rubs murmurs or gallops.  No Carotid bruits, radial pulse intact. No pedal edema.  Respiratory: Clear to auscultation bilaterally.  No wheezes, rales, or rhonchi.  No cyanosis, no use of accessory musculature GI: No organomegaly, abdomen is soft and non-tender, positive bowel sounds.  No masses. Skin: + acne scars, no foot ulcers Neurologic: Facial musculature symmetric. Microfilament exam normal Psychiatric: Patient is appropriate throughout our interaction. Lymphatic: No cervical lymphadenopathy Musculoskeletal: Gait intact. He has mid thoracic spine tenderness along paramsk and lower medial angle of right shoulder blade, full ROM. 5/5s strength Lumbar spine normal  LABS: Results for orders placed in visit on 01/05/13  POCT CBC      Result Value Range   WBC 6.7  4.6 - 10.2 K/uL   Lymph, poc 2.3  0.6 - 3.4   POC LYMPH PERCENT 33.9  10 - 50 %L   MID (cbc) 0.5  0 - 0.9   POC MID % 7.0  0 - 12 %M   POC Granulocyte 4.0  2 - 6.9   Granulocyte percent 59.1  37 - 80 %G   RBC 5.60  4.69 - 6.13 M/uL   Hemoglobin 16.7  14.1 - 18.1 g/dL   HCT, POC 16.1  09.6 - 53.7 %   MCV 92.1  80 - 97 fL   MCH, POC 29.8  27 - 31.2 pg   MCHC 32.4  31.8 - 35.4 g/dL   RDW, POC 04.5     Platelet Count, POC 305  142 - 424 K/uL   MPV 9.5  0 - 99.8 fL  POCT GLYCOSYLATED HEMOGLOBIN (HGB A1C)      Result Value Range   Hemoglobin A1C 7.8       EKG/XRAY:   Primary read interpreted by Dr. Conley Rolls at [facility]. Chest xray-no acute cardiopulmonary process T spine-no fx/dislocation   ASSESSMENT/PLAN: Encounter Diagnoses  Name Primary?  . Cough Yes  . Type II or unspecified type diabetes mellitus without mention of complication, uncontrolled   . Back pain     Most likely viral uri or allergies ( now in [place] the country) or smokers cough Back pain related to new job a [employer] Rx Tessalon perles and Mobic prn , dc Aleve Gross sideeffects,  risk and benefits, and alternatives of medications d/w patient. Patient is aware that all medications have potential sideeffects and we are unable to predict every sideeffect or drug-drug interaction that may occur. F/u prn   ,  PHUONG, DO 01/05/2013 8:26 PM   7/18 @ 11:56 spoke with patient regarding xray results. Will monitor. HE has had pain for a while now, comes and  goes, this particualr episode has been 3 days. He started a new job 2 days ago. He has been coughing more due to either viral illness, allergies and/or smoking or all of the above. C/w mobic with food. IF worse then f/u

## 2013-01-06 ENCOUNTER — Telehealth: Payer: Self-pay | Admitting: Family Medicine

## 2013-01-06 LAB — COMPREHENSIVE METABOLIC PANEL WITH GFR
ALT: 9 U/L (ref 0–53)
BUN: 9 mg/dL (ref 6–23)
CO2: 26 meq/L (ref 19–32)
Calcium: 9.7 mg/dL (ref 8.4–10.5)
Chloride: 104 meq/L (ref 96–112)
Creat: 0.86 mg/dL (ref 0.50–1.35)
Glucose, Bld: 181 mg/dL — ABNORMAL HIGH (ref 70–99)

## 2013-01-06 LAB — COMPREHENSIVE METABOLIC PANEL
AST: 13 U/L (ref 0–37)
Albumin: 4.5 g/dL (ref 3.5–5.2)
Alkaline Phosphatase: 61 U/L (ref 39–117)
Potassium: 4.1 mEq/L (ref 3.5–5.3)
Sodium: 139 mEq/L (ref 135–145)
Total Bilirubin: 0.6 mg/dL (ref 0.3–1.2)
Total Protein: 7.3 g/dL (ref 6.0–8.3)

## 2013-01-06 MED ORDER — MELOXICAM 7.5 MG PO TABS: 7.5000 mg | ORAL_TABLET | Freq: Every day | ORAL | Status: DC

## 2013-01-06 NOTE — Telephone Encounter (Signed)
[facility] called report for CXR. Bronchitic change without acute cardiopulmonary disease.  Mild (approximately 25%) age indeterminate compression deformity involving the T7 vertebral body. Correlation for point  tenderness at this location is recommended.

## 2013-02-15 ENCOUNTER — Ambulatory Visit: Payer: BC Managed Care – PPO | Admitting: Internal Medicine

## 2013-02-15 ENCOUNTER — Ambulatory Visit (INDEPENDENT_AMBULATORY_CARE_PROVIDER_SITE_OTHER): Payer: BC Managed Care – PPO | Admitting: Internal Medicine

## 2013-02-15 ENCOUNTER — Encounter: Payer: Self-pay | Admitting: Internal Medicine

## 2013-02-15 VITALS — BP 122/82 | HR 76 | Temp 98.6°F | Resp 16 | Ht 68.5 in | Wt 190.0 lb

## 2013-02-15 DIAGNOSIS — IMO0001 Reserved for inherently not codable concepts without codable children: Secondary | ICD-10-CM

## 2013-02-15 DIAGNOSIS — T887XXA Unspecified adverse effect of drug or medicament, initial encounter: Secondary | ICD-10-CM

## 2013-02-15 LAB — MICROALBUMIN / CREATININE URINE RATIO
Creatinine,U: 329.7 mg/dL
Microalb, Ur: 3.8 mg/dL — ABNORMAL HIGH (ref 0.0–1.9)

## 2013-02-15 NOTE — Progress Notes (Signed)
Subjective:     Patient ID: Gabriel Hamilton, male   DOB: 29-Sep-1970, 42 y.o.   MRN: [id number]  Diabetes Pertinent negatives for diabetes include no fatigue, no polydipsia, no polyphagia, no polyuria and no weakness.  Hypertension   Significant improvement in DM control Could not tolerate the victoza Has been following the diet and the a1c dropped Still has occasional night sweats Has lost weight Exercise planned  Review of Systems  Constitutional: Negative for fatigue and unexpected weight change.  HENT: Negative for ear pain, congestion and rhinorrhea.   Eyes: Negative for pain and redness.  Respiratory: Negative for cough and chest tightness.   Endocrine: Negative for polydipsia, polyphagia and polyuria.  Genitourinary: Negative for dysuria and frequency.  Musculoskeletal: Negative for joint swelling.  Skin: Negative for rash and wound.  Neurological: Negative for weakness and numbness.       Objective:   Physical Exam  Nursing note and vitals reviewed. Constitutional: He appears well-developed and well-nourished.  HENT:  Head: Normocephalic and atraumatic.  Eyes: Conjunctivae are normal. Pupils are equal, round, and reactive to light.  Neck: Normal range of motion. Neck supple.  Cardiovascular: Normal rate and regular rhythm.   Murmur heard. Pulmonary/Chest: Effort normal and breath sounds normal.  Abdominal: Soft. Bowel sounds are normal.  Skin:  Fungal nail       Assessment:    much improved diabetes improved weight.  Blood pressure stable still with fungal no nail although it appears to be improving.      Plan:    no change in current medications encourage activity to treat his A1c down below 7 but we're very pleased with the A1c where it is right now  Monitor a urine for microalbumin for kidney function and liver function to make sure medications are causing no side effects   Mild fatigue   Multi b

## 2013-02-15 NOTE — Patient Instructions (Signed)
The patient is instructed to continue all medications as prescribed. Schedule followup with check out clerk upon leaving the clinic  

## 2013-02-16 LAB — HEPATIC FUNCTION PANEL
Bilirubin, Direct: 0.1 mg/dL (ref 0.0–0.3)
Total Bilirubin: 0.6 mg/dL (ref 0.3–1.2)

## 2013-04-27 ENCOUNTER — Other Ambulatory Visit: Payer: Self-pay

## 2013-05-05 ENCOUNTER — Other Ambulatory Visit: Payer: Self-pay | Admitting: Internal Medicine

## 2013-06-07 ENCOUNTER — Telehealth: Payer: Self-pay | Admitting: Internal Medicine

## 2013-06-07 ENCOUNTER — Ambulatory Visit (HOSPITAL_BASED_OUTPATIENT_CLINIC_OR_DEPARTMENT_OTHER): Payer: BC Managed Care – PPO | Admitting: Physician Assistant

## 2013-06-07 ENCOUNTER — Encounter: Payer: Self-pay | Admitting: Physician Assistant

## 2013-06-07 ENCOUNTER — Ambulatory Visit (HOSPITAL_BASED_OUTPATIENT_CLINIC_OR_DEPARTMENT_OTHER): Payer: BC Managed Care – PPO

## 2013-06-07 ENCOUNTER — Other Ambulatory Visit (HOSPITAL_BASED_OUTPATIENT_CLINIC_OR_DEPARTMENT_OTHER): Payer: BC Managed Care – PPO

## 2013-06-07 VITALS — BP 197/91 | HR 99 | Temp 98.0°F | Resp 18

## 2013-06-07 VITALS — BP 145/89 | HR 102 | Temp 100.9°F | Resp 18 | Ht 68.5 in | Wt 191.9 lb

## 2013-06-07 DIAGNOSIS — D751 Secondary polycythemia: Secondary | ICD-10-CM

## 2013-06-07 DIAGNOSIS — D45 Polycythemia vera: Secondary | ICD-10-CM

## 2013-06-07 LAB — CBC WITH DIFFERENTIAL/PLATELET
Basophils Absolute: 0 10*3/uL (ref 0.0–0.1)
Eosinophils Absolute: 0.5 10*3/uL (ref 0.0–0.5)
HCT: 53.9 % — ABNORMAL HIGH (ref 38.4–49.9)
HGB: 17.9 g/dL — ABNORMAL HIGH (ref 13.0–17.1)
LYMPH%: 36.3 % (ref 14.0–49.0)
MCV: 85.8 fL (ref 79.3–98.0)
MONO%: 7 % (ref 0.0–14.0)
NEUT#: 3 10*3/uL (ref 1.5–6.5)
Platelets: 240 10*3/uL (ref 140–400)

## 2013-06-07 LAB — LACTATE DEHYDROGENASE (CC13): LDH: 144 U/L (ref 125–245)

## 2013-06-07 NOTE — Telephone Encounter (Signed)
gv and printed appt shced and avs for pt for March 2015...sed added tx.

## 2013-06-07 NOTE — Patient Instructions (Signed)
Discontinue smoking Follow up in 3 months

## 2013-06-07 NOTE — Patient Instructions (Signed)

## 2013-06-07 NOTE — Progress Notes (Addendum)
[facility] [phone]   [phone]  SHARED VISIT PROGRESS NOTE  Gabriel Mew, MD [address] [place] [address]  DIAGNOSIS: Polycythemia most likely reactive in nature.   PRIOR THERAPY: None   CURRENT THERAPY: Phlebotomy on as needed basis.  INTERVAL HISTORY: Gabriel Hamilton 42 y.o. male returns to the clinic today for followup visit. The patient was last seen in June of 2014. He has been doing fine since that time with no significant complaints. He denied having any significant weight loss or night sweats. He denied having any headache or blurry vision. Denies any issues with decreased mentation. The patient denied having any significant chest pain, shortness of breath, cough or hemoptysis. He is working on quitting smoking. He is down to about 2 cigarettes a day. He is using Nicorette as well as an E. cigarette to eventually taper and discontinue smoking altogether. He had repeat CBC performed earlier today and he see for evaluation and discussion of his lab results. He complains of some mid and low back pain. He reports that he was trimming some bushes over the weekend. He denied any numbness or tingling in the lower extremities or any loss of bowel or bladder control.  MEDICAL HISTORY: Past Medical History  Diagnosis Date  . Diabetes mellitus   . Acne   . GERD (gastroesophageal reflux disease)   . Hypertension   . Heart murmur     ALLERGIES:  is allergic to benazepril.  MEDICATIONS:  Current Outpatient Prescriptions  Medication Sig Dispense Refill  . ampicillin (PRINCIPEN) 500 MG capsule       . glimepiride (AMARYL) 4 MG tablet TAKE 1 TABLET BY MOUTH DAILY BEFORE BREAKFAST.  30 tablet  9  . glucose blood test strip 1 each by Other route as needed. Use as instructed       . irbesartan (AVAPRO) 150 MG tablet Take 1 tablet (150 mg total) by mouth at bedtime.  30 tablet  6  . KOMBIGLYZE XR 2.10-998 MG TB24 Take two  tablets by mouth daily  60 tablet  1   No current facility-administered medications for this visit.    REVIEW OF SYSTEMS:  A comprehensive review of systems was negative except for: Musculoskeletal: positive for back pain   PHYSICAL EXAMINATION: General appearance: alert, cooperative and no distress Head: Normocephalic, without obvious abnormality, atraumatic Neck: no adenopathy Lymph nodes: Cervical, supraclavicular, and axillary nodes normal. Resp: clear to auscultation bilaterally Back: symmetric, no curvature. ROM normal. No CVA tenderness., There is a palpable muscle spasm in the left lower lumbar paraspinal musculature Cardio: regular rate and rhythm, S1, S2 normal, no murmur, click, rub or gallop GI: soft, non-tender; bowel sounds normal; no masses,  no organomegaly Extremities: extremities normal, atraumatic, no cyanosis or edema  ECOG PERFORMANCE STATUS: 0 - Asymptomatic  Blood pressure 145/89, pulse 102, temperature 100.9 F (38.3 C), temperature source Oral, resp. rate 18, height 5' 8.5" (1.74 m), weight 191 lb 14.4 oz (87.045 kg).  LABORATORY DATA: Lab Results  Component Value Date   WBC 6.2 06/07/2013   HGB 17.9* 06/07/2013   HCT 53.9* 06/07/2013   MCV 85.8 06/07/2013   PLT 240 06/07/2013      Chemistry      Component Value Date/Time   NA 139 01/05/2013 2006   K 4.1 01/05/2013 2006   CL 104 01/05/2013 2006   CO2 26 01/05/2013 2006   BUN 9 01/05/2013 2006   CREATININE 0.86  01/05/2013 2006   CREATININE 0.9 05/11/2012 1211      Component Value Date/Time   CALCIUM 9.7 01/05/2013 2006   ALKPHOS 55 02/15/2013 0907   AST 17 02/15/2013 0907   ALT 12 02/15/2013 0907   BILITOT 0.6 02/15/2013 0907       RADIOGRAPHIC STUDIES: No results found.  ASSESSMENT AND PLAN: This is a very pleasant 42 years old [place] American male with history of polycythemia most likely reactive in nature secondary to smoking. His hematocrit is elevated today at 53.9 and he will require  phlebotomy of one unit of blood today. Patient was discussed with them also seen by Dr. Arbutus Ped. We'll have him come back in 3 months with repeat CBC differential, C. met and LDH as well as a phlebotomy appointment in the event that it is necessary. Patient is encouraged to discontinue smoking.   He was advised to call immediately if she has any concerning symptoms in the interval  All questions were answered. The patient knows to call the clinic with any problems, questions or concerns. We can certainly see the patient much sooner if necessary.  Gabriel Slipper PA-C  ADDENDUM: Hematology/Oncology Attending: I had a face to face encounter with the patient. I recommended his care plan. This is a very pleasant 42 years old [place] American male with polycythemia most likely reactive in nature secondary to persistent smoking. His hemoglobin and hematocrit are elevated today. I will arrange for the patient to proceed with phlebotomy today. He would come back for followup visit in 3 months with repeat CBC and phlebotomy as needed I strongly encouraged the patient to quit smoking and offered him to smoke cessation program. He would like to work on smoke cessation on his own. He was advised to call immediately if she has any concerning symptoms in the interval. Gabriel Hamilton., MD 06/07/2013

## 2013-07-28 ENCOUNTER — Other Ambulatory Visit: Payer: Self-pay | Admitting: Internal Medicine

## 2013-08-15 ENCOUNTER — Other Ambulatory Visit: Payer: Self-pay | Admitting: Internal Medicine

## 2013-08-23 ENCOUNTER — Other Ambulatory Visit (INDEPENDENT_AMBULATORY_CARE_PROVIDER_SITE_OTHER): Payer: BC Managed Care – PPO

## 2013-08-23 DIAGNOSIS — Z0000 Encounter for general adult medical examination without abnormal findings: Secondary | ICD-10-CM

## 2013-08-23 LAB — CBC WITH DIFFERENTIAL/PLATELET
BASOS PCT: 0.3 % (ref 0.0–3.0)
Basophils Absolute: 0 10*3/uL (ref 0.0–0.1)
EOS ABS: 0.3 10*3/uL (ref 0.0–0.7)
Eosinophils Relative: 4.6 % (ref 0.0–5.0)
HCT: 52 % (ref 39.0–52.0)
Hemoglobin: 17.2 g/dL — ABNORMAL HIGH (ref 13.0–17.0)
Lymphocytes Relative: 32.3 % (ref 12.0–46.0)
Lymphs Abs: 2.2 10*3/uL (ref 0.7–4.0)
MCHC: 33 g/dL (ref 30.0–36.0)
MCV: 89.6 fl (ref 78.0–100.0)
MONO ABS: 0.4 10*3/uL (ref 0.1–1.0)
Monocytes Relative: 6.6 % (ref 3.0–12.0)
NEUTROS PCT: 56.2 % (ref 43.0–77.0)
Neutro Abs: 3.7 10*3/uL (ref 1.4–7.7)
Platelets: 293 10*3/uL (ref 150.0–400.0)
RBC: 5.81 Mil/uL (ref 4.22–5.81)
RDW: 14.1 % (ref 11.5–14.6)
WBC: 6.7 10*3/uL (ref 4.5–10.5)

## 2013-08-23 LAB — HEPATIC FUNCTION PANEL
ALK PHOS: 59 U/L (ref 39–117)
ALT: 10 U/L (ref 0–53)
AST: 20 U/L (ref 0–37)
Albumin: 4.1 g/dL (ref 3.5–5.2)
BILIRUBIN DIRECT: 0.2 mg/dL (ref 0.0–0.3)
BILIRUBIN TOTAL: 1.3 mg/dL — AB (ref 0.3–1.2)
Total Protein: 7.1 g/dL (ref 6.0–8.3)

## 2013-08-23 LAB — LIPID PANEL
CHOL/HDL RATIO: 4
Cholesterol: 189 mg/dL (ref 0–200)
HDL: 44.1 mg/dL (ref >39.00)
LDL Cholesterol: 83 mg/dL (ref 0–99)
Triglycerides: 310 mg/dL — ABNORMAL HIGH (ref 0.0–149.0)
VLDL: 62 mg/dL — ABNORMAL HIGH (ref 0.0–40.0)

## 2013-08-23 LAB — BASIC METABOLIC PANEL
BUN: 10 mg/dL (ref 6–23)
CALCIUM: 9.9 mg/dL (ref 8.4–10.5)
CO2: 29 meq/L (ref 19–32)
CREATININE: 0.9 mg/dL (ref 0.4–1.5)
Chloride: 100 mEq/L (ref 96–112)
GFR: 119.46 mL/min (ref >60.00)
Glucose, Bld: 118 mg/dL — ABNORMAL HIGH (ref 70–99)
Potassium: 5.3 mEq/L — ABNORMAL HIGH (ref 3.5–5.1)
Sodium: 138 mEq/L (ref 135–145)

## 2013-08-23 LAB — HEMOGLOBIN A1C: Hgb A1c MFr Bld: 10.1 % — ABNORMAL HIGH (ref 4.6–6.5)

## 2013-08-23 LAB — POCT URINALYSIS DIPSTICK
BILIRUBIN UA: NEGATIVE
Blood, UA: NEGATIVE
GLUCOSE UA: NEGATIVE
Ketones, UA: NEGATIVE
LEUKOCYTES UA: NEGATIVE
NITRITE UA: NEGATIVE
Spec Grav, UA: 1.015
UROBILINOGEN UA: 1
pH, UA: 8.5

## 2013-08-23 LAB — MICROALBUMIN / CREATININE URINE RATIO
CREATININE, U: 120.4 mg/dL
MICROALB UR: 2.1 mg/dL — AB (ref 0.0–1.9)
MICROALB/CREAT RATIO: 1.7 mg/g (ref 0.0–30.0)

## 2013-08-23 LAB — TSH: TSH: 0.84 u[IU]/mL (ref 0.35–5.50)

## 2013-08-23 LAB — PSA: PSA: 0.56 ng/mL (ref 0.10–4.00)

## 2013-08-30 ENCOUNTER — Telehealth: Payer: Self-pay | Admitting: Internal Medicine

## 2013-08-30 ENCOUNTER — Ambulatory Visit (INDEPENDENT_AMBULATORY_CARE_PROVIDER_SITE_OTHER): Payer: BC Managed Care – PPO | Admitting: Internal Medicine

## 2013-08-30 ENCOUNTER — Encounter: Payer: Self-pay | Admitting: Internal Medicine

## 2013-08-30 VITALS — BP 122/80 | HR 96 | Temp 98.6°F | Ht 69.0 in | Wt 191.0 lb

## 2013-08-30 DIAGNOSIS — E119 Type 2 diabetes mellitus without complications: Secondary | ICD-10-CM

## 2013-08-30 DIAGNOSIS — I10 Essential (primary) hypertension: Secondary | ICD-10-CM

## 2013-08-30 DIAGNOSIS — Z0000 Encounter for general adult medical examination without abnormal findings: Secondary | ICD-10-CM

## 2013-08-30 DIAGNOSIS — R0789 Other chest pain: Secondary | ICD-10-CM

## 2013-08-30 MED ORDER — SAXAGLIPTIN-METFORMIN ER 5-1000 MG PO TB24: 1.0000 | ORAL_TABLET | Freq: Two times a day (BID) | ORAL | Status: DC

## 2013-08-30 NOTE — Telephone Encounter (Signed)
Relevant patient education assigned to patient using Emmi. ° °

## 2013-08-30 NOTE — Progress Notes (Signed)
Subjective:    Patient ID: Gabriel Hamilton, male    DOB: 05/16/1971, 43 y.o.   MRN: [id number]  HPI Weight gain DM and HTN CPX  Feeling muscle aches and pains and having some chest "soreness"    Review of Systems  Constitutional: Negative for fever and fatigue.  HENT: Negative for congestion, hearing loss and postnasal drip.   Eyes: Negative for discharge, redness and visual disturbance.  Respiratory: Negative for cough, shortness of breath and wheezing.   Cardiovascular: Negative for leg swelling.  Gastrointestinal: Negative for abdominal pain, constipation and abdominal distention.  Genitourinary: Negative for urgency and frequency.  Musculoskeletal: Negative for arthralgias, joint swelling and neck pain.  Skin: Negative for color change and rash.  Neurological: Negative for weakness and light-headedness.  Hematological: Negative for adenopathy.  Psychiatric/Behavioral: Negative for behavioral problems.   Past Medical History  Diagnosis Date  . Diabetes mellitus   . Acne   . GERD (gastroesophageal reflux disease)   . Hypertension   . Heart murmur     History   Social History  . Marital Status: Married    Spouse Name: N/A    Number of Children: N/A  . Years of Education: N/A   Occupational History  . Not on file.   Social History Main Topics  . Smoking status: Current Every Day Smoker -- 0.30 packs/day    Types: Cigarettes  . Smokeless tobacco: Not on file  . Alcohol Use: Yes  . Drug Use: No  . Sexual Activity: Yes   Other Topics Concern  . Not on file   Social History Narrative  . No narrative on file    No past surgical history on file.  Family History  Problem Relation Age of Onset  . Arthritis Mother   . Diabetes Father   . Heart disease Father   . Hyperlipidemia Father   . Hypertension Father     Allergies  Allergen Reactions  . Benazepril     May have caused angioedema    Current Outpatient Prescriptions on File Prior to Visit    Medication Sig Dispense Refill  . ampicillin (PRINCIPEN) 500 MG capsule Take 500 mg by mouth 2 (two) times daily.       [name] glimepiride (AMARYL) 4 MG tablet TAKE 1 TABLET BY MOUTH DAILY BEFORE BREAKFAST.  30 tablet  9  . irbesartan (AVAPRO) 150 MG tablet Take 1 tablet (150 mg total) by mouth at bedtime.  30 tablet  6  . KOMBIGLYZE XR 2.10-998 MG TB24 Take two tablets by mouth daily  60 tablet  1   No current facility-administered medications on file prior to visit.    BP 122/80  Pulse 96  Temp(Src) 98.6 F (37 C) (Oral)  Ht 5\' 9"  (1.753 m)  Wt 191 lb (86.637 kg)  BMI 28.19 kg/m2        Objective:   Physical Exam  Nursing note and vitals reviewed. Constitutional: He is oriented to person, place, and time. He appears well-developed and well-nourished.  HENT:  Head: Normocephalic and atraumatic.  Eyes: Conjunctivae are normal. Pupils are equal, round, and reactive to light.  Neck: Normal range of motion. Neck supple.  Cardiovascular: Normal rate and regular rhythm.   Pulmonary/Chest: Effort normal and breath sounds normal.  Abdominal: Soft. Bowel sounds are normal.  Genitourinary: Rectum normal and prostate normal.  Neurological: He is alert and oriented to person, place, and time.  Skin: Skin is warm and dry.  Assessment & Plan:  Patient presents for yearly preventative medicine examination. Medicare questionnaire was completed  All immunizations and health maintenance protocols were reviewed with the patient and needed orders were placed.  Appropriate screening laboratory values were ordered for the patient including screening of hyperlipidemia, renal function and hepatic function. If indicated by BPH, a PSA was ordered.  Medication reconciliation,  past medical history, social history, problem list and allergies were reviewed in detail with the patient  Goals were established with regard to weight loss, exercise, and  diet in compliance with  medications  End of life planning was discussed.  DM  Poor control with diet and stress and  Grief Weight is stable but feels bloated  And body soreness?  High risk family hx and DM with HTN  Smoker!!!! Stop smoking  Stress testing Increase the kombiglyze to the 5/ 1000   Needs stress testing

## 2013-08-30 NOTE — Progress Notes (Signed)
Pre visit review using our clinic review tool, if applicable. No additional management support is needed unless otherwise documented below in the visit note. 

## 2013-08-30 NOTE — Patient Instructions (Signed)
The patient is instructed to continue all medications as prescribed. Schedule followup with check out clerk upon leaving the clinic  

## 2013-09-05 ENCOUNTER — Telehealth: Payer: Self-pay | Admitting: Internal Medicine

## 2013-09-05 NOTE — Telephone Encounter (Signed)
returned pt call requesting to r/s 3.18.15 appt...lvm for pt to call back with d/t he is trying to r/s

## 2013-09-06 ENCOUNTER — Ambulatory Visit: Payer: BC Managed Care – PPO | Admitting: Internal Medicine

## 2013-09-06 ENCOUNTER — Other Ambulatory Visit: Payer: BC Managed Care – PPO

## 2013-09-13 ENCOUNTER — Other Ambulatory Visit: Payer: Self-pay | Admitting: Internal Medicine

## 2013-09-19 ENCOUNTER — Encounter (HOSPITAL_COMMUNITY): Payer: BC Managed Care – PPO

## 2013-09-22 ENCOUNTER — Telehealth (HOSPITAL_COMMUNITY): Payer: Self-pay

## 2013-09-27 ENCOUNTER — Ambulatory Visit (HOSPITAL_COMMUNITY)
Admission: RE | Admit: 2013-09-27 | Discharge: 2013-09-27 | Disposition: A | Discharge status: Home / Self Care | Payer: BC Managed Care – PPO | Source: Ambulatory Visit | Attending: Internal Medicine | Admitting: Internal Medicine

## 2013-09-27 DIAGNOSIS — I10 Essential (primary) hypertension: Secondary | ICD-10-CM | POA: Insufficient documentation

## 2013-09-27 DIAGNOSIS — R079 Chest pain, unspecified: Secondary | ICD-10-CM | POA: Insufficient documentation

## 2013-09-27 DIAGNOSIS — R0789 Other chest pain: Secondary | ICD-10-CM

## 2013-09-27 DIAGNOSIS — Z0000 Encounter for general adult medical examination without abnormal findings: Secondary | ICD-10-CM

## 2013-09-27 DIAGNOSIS — E119 Type 2 diabetes mellitus without complications: Secondary | ICD-10-CM

## 2013-10-18 ENCOUNTER — Telehealth: Payer: Self-pay | Admitting: Internal Medicine

## 2013-10-18 NOTE — Telephone Encounter (Signed)
Let him know that the cardiologist read the test as no heart disease!

## 2013-10-18 NOTE — Telephone Encounter (Signed)
Called spoke with pt and pt is aware.

## 2013-10-18 NOTE — Telephone Encounter (Signed)
Pt need stress test results

## 2013-11-22 ENCOUNTER — Ambulatory Visit: Payer: BC Managed Care – PPO | Admitting: Family

## 2013-11-29 LAB — HM DIABETES EYE EXAM

## 2013-12-01 ENCOUNTER — Other Ambulatory Visit: Payer: Self-pay | Admitting: Internal Medicine

## 2013-12-13 ENCOUNTER — Encounter: Payer: Self-pay | Admitting: Family

## 2013-12-13 ENCOUNTER — Ambulatory Visit (INDEPENDENT_AMBULATORY_CARE_PROVIDER_SITE_OTHER): Payer: BC Managed Care – PPO | Admitting: Family

## 2013-12-13 VITALS — BP 92/60 | HR 119 | Temp 98.6°F | Wt 193.0 lb

## 2013-12-13 DIAGNOSIS — E119 Type 2 diabetes mellitus without complications: Secondary | ICD-10-CM

## 2013-12-13 DIAGNOSIS — R5381 Other malaise: Secondary | ICD-10-CM

## 2013-12-13 DIAGNOSIS — R5383 Other fatigue: Secondary | ICD-10-CM

## 2013-12-13 LAB — COMPREHENSIVE METABOLIC PANEL
ALK PHOS: 54 U/L (ref 39–117)
ALT: 10 U/L (ref 0–53)
AST: 22 U/L (ref 0–37)
Albumin: 4.1 g/dL (ref 3.5–5.2)
BILIRUBIN TOTAL: 0.7 mg/dL (ref 0.2–1.2)
BUN: 6 mg/dL (ref 6–23)
CO2: 23 mEq/L (ref 19–32)
CREATININE: 0.9 mg/dL (ref 0.4–1.5)
Calcium: 9.2 mg/dL (ref 8.4–10.5)
Chloride: 101 mEq/L (ref 96–112)
GFR: 116.27 mL/min (ref >60.00)
Glucose, Bld: 111 mg/dL — ABNORMAL HIGH (ref 70–99)
Potassium: 3.6 mEq/L (ref 3.5–5.1)
Sodium: 138 mEq/L (ref 135–145)
Total Protein: 7.1 g/dL (ref 6.0–8.3)

## 2013-12-13 LAB — HEMOGLOBIN A1C: HEMOGLOBIN A1C: 9.2 % — AB (ref 4.6–6.5)

## 2013-12-13 LAB — TESTOSTERONE: Testosterone: 255.78 ng/dL — ABNORMAL LOW (ref 300.00–890.00)

## 2013-12-13 LAB — TSH: TSH: 0.74 u[IU]/mL (ref 0.35–4.50)

## 2013-12-13 NOTE — Patient Instructions (Signed)

## 2013-12-13 NOTE — Addendum Note (Signed)
Addended by: Santiago Bumpers on: 12/13/2013 02:53 PM   Modules accepted: Orders

## 2013-12-13 NOTE — Progress Notes (Signed)
Pre visit review using our clinic review tool, if applicable. No additional management support is needed unless otherwise documented below in the visit note. 

## 2013-12-13 NOTE — Progress Notes (Signed)
Subjective:    Patient ID: Gabriel Hamilton, male    DOB: [birth year], 43 y.o.   MRN: [id number]  HPI 43 year old African American smoking male presents for follow up after non-stress test, which was normal. He reports that he was seen for NST after complaints of chest pain, which resolved.  Discussion of elevated heart rate revealed that he denies palpitations or sensation of heart racing, chest pain, dyspnea, paroxysmal nocturnal dyspnea, swelling.  He describes intermittent fatigue for the past year, noticeably worse in the summer. He also an inability to achieve and sustain an erection.    Review of Systems  Constitutional: Positive for fatigue.  HENT: Negative.   Eyes: Negative.   Respiratory: Negative.   Cardiovascular: Negative.   Gastrointestinal: Negative.   Endocrine: Negative.   Genitourinary:       Difficulty achieving or sustaining orgasm.   Skin: Negative.   Allergic/Immunologic: Negative.   Neurological: Negative.   Hematological: Negative.   Psychiatric/Behavioral: Negative.    Past Medical History  Diagnosis Date  . Diabetes mellitus   . Acne   . GERD (gastroesophageal reflux disease)   . Hypertension   . Heart murmur     History   Social History  . Marital Status: Married    Spouse Name: N/A    Number of Children: N/A  . Years of Education: N/A   Occupational History  . Not on file.   Social History Main Topics  . Smoking status: Current Every Day Smoker -- 0.30 packs/day    Types: Cigarettes  . Smokeless tobacco: Not on file  . Alcohol Use: Yes  . Drug Use: No  . Sexual Activity: Yes   Other Topics Concern  . Not on file   Social History Narrative  . No narrative on file    No past surgical history on file.  Family History  Problem Relation Age of Onset  . Arthritis Mother   . Diabetes Father   . Heart disease Father   . Hyperlipidemia Father   . Hypertension Father     Allergies  Allergen Reactions  . Benazepril     May  have caused angioedema    Current Outpatient Prescriptions on File Prior to Visit  Medication Sig Dispense Refill  . ampicillin (PRINCIPEN) 500 MG capsule Take 500 mg by mouth 2 (two) times daily.       [name] glimepiride (AMARYL) 4 MG tablet Take one tablet by mouth every day before breakfast.  30 tablet  11  . glucose blood (ACCU-CHEK SMARTVIEW) test strip test once daily      . irbesartan (AVAPRO) 150 MG tablet Take 1 tablet (150 mg total) by mouth at bedtime.  30 tablet  6  . KOMBIGLYZE XR 2.10-998 MG TB24 TAKE TWO TABLETS BY MOUTH DAILY   180 tablet  1  . naproxen sodium (ALEVE) 220 MG tablet Take 220 mg by mouth at bedtime as needed.      [name] omeprazole (PRILOSEC) 20 MG capsule Take 20 mg by mouth daily.       No current facility-administered medications on file prior to visit.    BP 92/60  Pulse 119  Temp(Src) 98.6 F (37 C) (Oral)  Wt 193 lb (87.544 kg)     Objective:   Physical Exam  Constitutional: He is oriented to person, place, and time. He appears well-developed and well-nourished.  HENT:  Head: Normocephalic and atraumatic.  Eyes: EOM are normal. Pupils are equal, round, and reactive to  light.  Neck: Normal range of motion.  Cardiovascular: Normal rate, regular rhythm, normal heart sounds and intact distal pulses.  Exam reveals no gallop and no friction rub.   No murmur heard. Pulmonary/Chest: Effort normal.  Abdominal: Soft.  Musculoskeletal: Normal range of motion. He exhibits no edema.  Neurological: He is alert and oriented to person, place, and time.  Skin: Skin is warm and dry. He is not diaphoretic.  Psychiatric: He has a normal mood and affect. His behavior is normal. Judgment and thought content normal.       Assessment & Plan:  Gabriel Hamilton was seen today for follow-up.  Diagnoses and associated orders for this visit:  Other malaise and fatigue - Cancel: Basic Metabolic Panel - Testosterone - TSH - CMP  Type II or unspecified type diabetes mellitus  without mention of complication, not stated as uncontrolled - Hemoglobin A1c

## 2013-12-26 NOTE — Telephone Encounter (Signed)
Encounter complete. 

## 2014-01-03 ENCOUNTER — Ambulatory Visit: Payer: BC Managed Care – PPO | Admitting: Internal Medicine

## 2014-01-17 ENCOUNTER — Ambulatory Visit: Payer: BC Managed Care – PPO | Admitting: Internal Medicine

## 2014-01-26 ENCOUNTER — Telehealth: Payer: Self-pay | Admitting: Internal Medicine

## 2014-01-26 NOTE — Telephone Encounter (Signed)
Pt states Dr. Arnoldo Morale recently increased the dosage of his KOMBIGLYZE XR 2.10-998 MG TB24, which caused his copay to increase to $100.  Pt request either a discount card to help lower the cost of his copay or to have the dosage changed back to was he was taking before.  Pt states Dr. Arnoldo Morale mentioned he may not be on this medication long term and therefore pt doesn't want to pay that much for this medication.  Pharmacy:  [facility]

## 2014-01-26 NOTE — Telephone Encounter (Signed)
Pt is needing new rx for KOMBIGLYZE XR 2.10-998 MG TB24, pt states he need enough meds to get him through Wednesday until his appointment with dr. Cruzita Lederer. [facility] on [email or web address] was prescribed by dr.[email or web address].

## 2014-01-26 NOTE — Telephone Encounter (Signed)
Gabriel Hamilton placed a referral for pt to see endocrinology on 12/13/13 and a message was left for pt to call them back to schedule.  I advised pt to call Endo to get appointment scheduled at which time he can discuss medication for diabetes and testosterone level

## 2014-01-27 MED ORDER — SAXAGLIPTIN-METFORMIN ER 2.5-1000 MG PO TB24
ORAL_TABLET | ORAL | Status: DC
Start: 2014-01-27 — End: 2014-01-31

## 2014-01-27 NOTE — Telephone Encounter (Signed)
Left a message making aware rx sent to pharmacy.

## 2014-01-27 NOTE — Telephone Encounter (Signed)
Rx sent to pharmacy   

## 2014-01-31 ENCOUNTER — Encounter: Payer: Self-pay | Admitting: Internal Medicine

## 2014-01-31 ENCOUNTER — Ambulatory Visit (INDEPENDENT_AMBULATORY_CARE_PROVIDER_SITE_OTHER): Payer: BC Managed Care – PPO | Admitting: Internal Medicine

## 2014-01-31 VITALS — BP 138/86 | HR 95 | Temp 98.1°F | Resp 12 | Ht 68.5 in | Wt 184.0 lb

## 2014-01-31 DIAGNOSIS — E119 Type 2 diabetes mellitus without complications: Secondary | ICD-10-CM

## 2014-01-31 MED ORDER — CANAGLIFLOZIN 100 MG PO TABS: ORAL_TABLET | ORAL | Status: DC

## 2014-01-31 MED ORDER — METFORMIN HCL 1000 MG PO TABS: 1000.0000 mg | ORAL_TABLET | Freq: Two times a day (BID) | ORAL | Status: DC

## 2014-01-31 NOTE — Progress Notes (Signed)
Patient ID: Gabriel Hamilton, male   DOB: [birth year], 43 y.o.   MRN: [id number]  HPI: Gabriel Hamilton is a 43 y.o.-year-old male, referred by Roxy Cedar, for management of DM2, non-insulin-dependent, uncontrolled, without complications.   Patient has been diagnosed with diabetes in 2007-8; he has not been on insulin before.  Last hemoglobin A1c was: Lab Results  Component Value Date   HGBA1C 9.2* 12/13/2013   HGBA1C 10.1* 08/23/2013   HGBA1C 7.8 01/05/2013   Pt is on a regimen of: - Amaryl 4 mg daily in am - Kombiglyze XR 2.10-998 2x a day - not taken in 2 weeks as he could not afford the copay of 100$ per mo. He also tried [place] >> worked well. He also tried Onglyza in the past.   Pt checks his sugars 2x a day and they were (on Kombiglyze): - am: 160-200 (220-260 off Kombiglyze) - 2h after b'fast: n/c - before lunch: n/c - 2h after lunch: n/c - before dinner: 78-115 (180s off Kombiglyze) - 2h after dinner: n/c - bedtime: n/c - nighttime: n/c No lows. Lowest sugar was 68, not recently; he has hypoglycemia awareness at 70.  Highest sugar was 280.  Pt's meals are: - Breakfast: granola bar +/- fruit; may skip it - Lunch: salad, may skip - Dinner: meat (chicken, sausage, steak), veggies, starch (potatoes, rice); stews - Snacks:  Drinks water, ginger ale (regular).   He lost 30 lbs in the last years.  - no CKD, last BUN/creatinine:  Lab Results  Component Value Date   BUN 6 12/13/2013   CREATININE 0.9 12/13/2013  On Avapro. - last set of lipids: Lab Results  Component Value Date   CHOL 189 08/23/2013   HDL 44.10 08/23/2013   LDLCALC 83 08/23/2013   LDLDIRECT 88.4 07/15/2011   TRIG 310.0* 08/23/2013   CHOLHDL 4 08/23/2013   - last eye exam was in 11/29/2013 - Dr Gershon Crane. No DR.  - no numbness and tingling in his feet.  Pt has FH of DM in father.   ROS: Constitutional: no weight gain/loss, + fatigue, + subjective hyperthermia Eyes: no blurry vision, no xerophthalmia ENT:  no sore throat, no nodules palpated in throat, no dysphagia/odynophagia, no hoarseness Cardiovascular: + CP (mm aches)/no SOB/palpitations/leg swelling Respiratory: no cough/SOB Gastrointestinal: no N/V/D/C, + heartburn Musculoskeletal: + all: muscle/joint aches Skin: no rashes Neurological: no tremors/numbness/tingling/dizziness, + HA Psychiatric: no depression/anxiety + diff with erections  Past Medical History  Diagnosis Date  . Diabetes mellitus   . Acne   . GERD (gastroesophageal reflux disease)   . Hypertension   . Heart murmur    No past surgical history on file. History   Social History  . Marital Status: Married    Spouse Name: N/A    Number of Children: 0   Occupational History  . sales   Social History Main Topics  . Smoking status: Current Every Day Smoker -- 0.50 packs/day    Types: Cigarettes  . Smokeless tobacco: Not on file  . Alcohol Use: Yes, 3x a day - spirit  . Drug Use: No  . Sexual Activity: Yes   Current Outpatient Prescriptions on File Prior to Visit  Medication Sig Dispense Refill  . ampicillin (PRINCIPEN) 500 MG capsule Take 500 mg by mouth 2 (two) times daily.       [name] glimepiride (AMARYL) 4 MG tablet Take one tablet by mouth every day before breakfast.  30 tablet  11  . glucose blood (ACCU-CHEK SMARTVIEW) test  strip test once daily      . naproxen sodium (ALEVE) 220 MG tablet Take 220 mg by mouth at bedtime as needed.      [name] omeprazole (PRILOSEC) 20 MG capsule Take 20 mg by mouth daily.      . irbesartan (AVAPRO) 150 MG tablet Take 1 tablet (150 mg total) by mouth at bedtime.  30 tablet  6   No current facility-administered medications on file prior to visit.   Allergies  Allergen Reactions  . Benazepril     May have caused angioedema   Family History  Problem Relation Age of Onset  . Arthritis Mother   . Diabetes Father   . Heart disease Father   . Hyperlipidemia Father   . Hypertension Father    PE: BP 138/86  Pulse 95   Temp(Src) 98.1 F (36.7 C) (Oral)  Resp 12  Ht 5' 8.5" (1.74 m)  Wt 184 lb (83.462 kg)  BMI 27.57 kg/m2  SpO2 95% Wt Readings from Last 3 Encounters:  01/31/14 184 lb (83.462 kg)  12/13/13 193 lb (87.544 kg)  08/30/13 191 lb (86.637 kg)   Constitutional: overweight, in NAD Eyes: PERRLA, EOMI, no exophthalmos ENT: moist mucous membranes, no thyromegaly, no cervical lymphadenopathy Cardiovascular: RRR, No MRG Respiratory: CTA B Gastrointestinal: abdomen soft, NT, ND, BS+ Musculoskeletal: no deformities, strength intact in all 4 Skin: moist, warm, no rashes Neurological: no tremor with outstretched hands, DTR normal in all 4  ASSESSMENT: 1. DM2, non-insulin-dependent, uncontrolled, without complications  PLAN:  1. Patient with long-standing, uncontrolled diabetes, on oral antidiabetic regimen, which became insufficient. He cannot afford Kombiglyze. - We discussed about options for treatment, and I suggested to:  Patient Instructions  Please stop Kombiglyze. Start Metformin 1000 mg 2x a day, with meals. Continue Amaryl 4 mg daily in am. Start Invokana 100 mg daily in am. Please come back for a visit after 03/15/2014 for a visit. Bring your sugar log. Check sugars 2x a day, rotating checks.Please let me know if sugars are consistently <80 or >180. - we discussed about SEs of Invokana, which are: dizziness (advised to be careful when stands from sitting position), decreased BP - usually not < normal (BP today is not low), and fungal UTIs (advised to let me know if develops one).  - given discount card for Invokana - discussed at length about diet >> may need nutrition referral at next visit - Strongly advised him to start checking sugars at different times of the day - check 2 times a day, rotating checks - given sugar log and advised how to fill it and to bring it at next appt  - given foot care handout and explained the principles  - given instructions for hypoglycemia management  "15-15 rule"  - advised for yearly eye exams >> he is UTD - Return to clinic in 1.5 mo with sugar log >> will check Hba1c and BMP then

## 2014-01-31 NOTE — Patient Instructions (Signed)
Please stop Kombiglyze. Start Metformin 1000 mg 2x a day, with meals. Continue Amaryl 4 mg daily in am. Start Invokana 100 mg daily in am.  Please come back for a visit after 03/15/2014 for a visit. Bring your sugar log. Check sugars 2x a day, rotating checks.Please let me know if sugars are consistently <80 or >180.  PATIENT INSTRUCTIONS FOR TYPE 2 DIABETES:  **Please join MyChart!** - see attached instructions about how to join if you have not done so already.  DIET AND EXERCISE Diet and exercise is an important part of diabetic treatment.  We recommended aerobic exercise in the form of brisk walking (working between 40-60% of maximal aerobic capacity, similar to brisk walking) for 150 minutes per week (such as 30 minutes five days per week) along with 3 times per week performing 'resistance' training (using various gauge rubber tubes with handles) 5-10 exercises involving the major muscle groups (upper body, lower body and core) performing 10-15 repetitions (or near fatigue) each exercise. Start at half the above goal but build slowly to reach the above goals. If limited by weight, joint pain, or disability, we recommend daily walking in a swimming pool with water up to waist to reduce pressure from joints while allow for adequate exercise.    BLOOD GLUCOSES Monitoring your blood glucoses is important for continued management of your diabetes. Please check your blood glucoses 2-4 times a day: fasting, before meals and at bedtime (you can rotate these measurements - e.g. one day check before the 3 meals, the next day check before 2 of the meals and before bedtime, etc.).   HYPOGLYCEMIA (low blood sugar) Hypoglycemia is usually a reaction to not eating, exercising, or taking too much insulin/ other diabetes drugs.  Symptoms include tremors, sweating, hunger, confusion, headache, etc. Treat IMMEDIATELY with 15 grams of Carbs:   4 glucose tablets    cup regular juice/soda   2 tablespoons  raisins   4 teaspoons sugar   1 tablespoon honey Recheck blood glucose in 15 mins and repeat above if still symptomatic/blood glucose <100.  RECOMMENDATIONS TO REDUCE YOUR RISK OF DIABETIC COMPLICATIONS: * Take your prescribed MEDICATION(S) * Follow a DIABETIC diet: Complex carbs, fiber rich foods, (monounsaturated and polyunsaturated) fats * AVOID saturated/trans fats, high fat foods, >2,300 mg salt per day. * EXERCISE at least 5 times a week for 30 minutes or preferably daily.  * DO NOT SMOKE OR DRINK more than 1 drink a day. * Check your FEET every day. Do not wear tightfitting shoes. Contact us if you develop an ulcer * See your EYE doctor once a year or more if needed * Get a FLU shot once a year * Get a PNEUMONIA vaccine once before and once after age 16 years  GOALS:  * Your Hemoglobin A1c of <7%  * fasting sugars need to be <130 * after meals sugars need to be <180 (2h after you start eating) * Your Systolic BP should be 341 or lower  * Your Diastolic BP should be 80 or lower  * Your HDL (Good Cholesterol) should be 40 or higher  * Your LDL (Bad Cholesterol) should be 100 or lower. * Your Triglycerides should be 150 or lower  * Your Urine microalbumin (kidney function) should be <30 * Your Body Mass Index should be 25 or lower   We will be glad to help you achieve these goals. Our telephone number is: [phone].

## 2014-02-14 ENCOUNTER — Encounter: Payer: Self-pay | Admitting: Family Medicine

## 2014-02-14 ENCOUNTER — Ambulatory Visit (INDEPENDENT_AMBULATORY_CARE_PROVIDER_SITE_OTHER): Payer: BC Managed Care – PPO | Admitting: Family Medicine

## 2014-02-14 VITALS — BP 125/78 | HR 103 | Temp 98.4°F | Ht 68.0 in | Wt 182.0 lb

## 2014-02-14 DIAGNOSIS — Z720 Tobacco use: Secondary | ICD-10-CM

## 2014-02-14 DIAGNOSIS — Z23 Encounter for immunization: Secondary | ICD-10-CM

## 2014-02-14 DIAGNOSIS — R000 Tachycardia, unspecified: Secondary | ICD-10-CM | POA: Insufficient documentation

## 2014-02-14 DIAGNOSIS — E119 Type 2 diabetes mellitus without complications: Secondary | ICD-10-CM

## 2014-02-14 DIAGNOSIS — F17200 Nicotine dependence, unspecified, uncomplicated: Secondary | ICD-10-CM

## 2014-02-14 DIAGNOSIS — I10 Essential (primary) hypertension: Secondary | ICD-10-CM

## 2014-02-14 DIAGNOSIS — L709 Acne, unspecified: Secondary | ICD-10-CM | POA: Insufficient documentation

## 2014-02-14 NOTE — Assessment & Plan Note (Signed)
Foot exam completed today. Encouraged patient to add walking back to his regimen. Medications currently being managed by endocrine including Amaryl Invokana and metformin. He has followup with them in late September. Hopeful patient can reach an A1c less than 8 by next visit

## 2014-02-14 NOTE — Assessment & Plan Note (Signed)
Half pack per day. Counseled on need for cessation. Gave patient handout on options for quitting. Recommended to start by adding 7 mg nicotine patch and continuing when necessary Nicorette gum. He is going to try to cut back further using this method. Advised 1 800 quit now. Patient very interested in quitting but not ready today-he will contact me when ready.

## 2014-02-14 NOTE — Assessment & Plan Note (Signed)
Well-controlled. Continued irbesartan

## 2014-02-14 NOTE — Progress Notes (Signed)
Gabriel Reddish, MD Phone: [phone]  Subjective:  Patient presents today to establish care. Chief complaint-noted.   Diabetes mellitus Recently referred to endocrine. Patient states with Invokana Amaryl and metformin he has noticed improvement in his CBGs. Fasting sugars from 130 to 150. He enjoys walking in the park (about to start back up). He states he has made improvements in his diet Health Maintenance Due  Topic Date Due  . Foot Exam - today 07/10/2010  . Pneumococcal Polysaccharide Vaccine (##2)- today  06/06/2013  . Influenza Vaccine - refuses  01/20/2014   Tobacco abuse 1/2 PPD. Nicorette to keep them from smoking more. He is interested in quitting but not ready at this time. Extensive counseling provided on the need to quit. ROS-no chest pain or shortness of breath  Hypertension BP Readings from Last 3 Encounters:  02/14/14 125/78  01/31/14 138/86  12/13/13 92/60  Home BP monitoring-no Compliant with medications-yes without side effects ROS-Denies any CP, HA, SOB, blurry vision, LE edema.  The following were reviewed and entered/updated in epic: Past Medical History  Diagnosis Date  . Diabetes mellitus   . Acne   . GERD (gastroesophageal reflux disease)   . Hypertension   . Heart murmur    Patient Active Problem List   Diagnosis Date Noted  . Tobacco abuse 12/22/2011    Priority: High  . DIABETES MELLITUS, TYPE II 03/27/2008    Priority: High  . HYPERTENSION, MODERATE 07/10/2009    Priority: Medium  . Tachycardia 02/14/2014    Priority: Low  . Acne     Priority: Low  . Polycythemia 08/26/2011    Priority: Low  . HYPOGONADISM 11/21/2009    Priority: Low  . GERD 03/27/2008    Priority: Low  . HEART MURMUR 03/27/2008    Priority: Low   No past surgical history on file.  Family History  Problem Relation Age of Onset  . Arthritis Mother   . Diabetes Father   . Heart disease Father   . Hyperlipidemia Father   . Hypertension Father   . Cancer  Mother     optic nerve    Medications- reviewed and updated Current Outpatient Prescriptions  Medication Sig Dispense Refill  . ampicillin (PRINCIPEN) 500 MG capsule Take 500 mg by mouth 2 (two) times daily.       . Canagliflozin (INVOKANA) 100 MG TABS Take 100 mg daily in am  30 tablet  2  . glimepiride (AMARYL) 4 MG tablet Take one tablet by mouth every day before breakfast.  30 tablet  11  . glucose blood (ACCU-CHEK SMARTVIEW) test strip test once daily      . irbesartan (AVAPRO) 150 MG tablet Take 1 tablet (150 mg total) by mouth at bedtime.  30 tablet  6  . metFORMIN (GLUCOPHAGE) 1000 MG tablet Take 1 tablet (1,000 mg total) by mouth 2 (two) times daily with a meal.  60 tablet  3  . omeprazole (PRILOSEC) 20 MG capsule Take 20 mg by mouth daily.      . naproxen sodium (ALEVE) 220 MG tablet Take 220 mg by mouth at bedtime as needed.       No current facility-administered medications for this visit.    Allergies-reviewed and updated Allergies  Allergen Reactions  . Benazepril     May have caused angioedema    History   Social History  . Marital Status: Married    Spouse Name: N/A    Number of Children: N/A  . Years of Education:  N/A   Social History Main Topics  . Smoking status: Current Every Day Smoker -- 0.50 packs/day for 22 years    Types: Cigarettes  . Smokeless tobacco: Not on file  . Alcohol Use: 7.0 oz/week    14 drink(s) per week  . Drug Use: No  . Sexual Activity: Yes   Other Topics Concern  . Not on file   Social History Narrative   Married for 8 years in 2015. Wife with 2 sons from previous marriage. 3 grandsons. 1 niece. Close family.       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 Ext: no edema, DM foot exam normal Skin: warm, dry, no rash, does have nail thickening from onychomychosis  Assessment/Plan:  DIABETES MELLITUS, TYPE II Foot exam completed today. Encouraged patient to add walking back to his regimen. Medications currently being managed by endocrine including Amaryl Invokana and metformin. He has followup with them in late September. Hopeful patient can reach an A1c less than 8 by next visit  Tobacco abuse Half pack per day. Counseled on need for cessation. Gave patient handout on options for quitting. Recommended to start by adding 7 mg nicotine patch and continuing when necessary Nicorette gum. He is going to try to cut back further using this method. Advised 1 800 quit now. Patient very interested in quitting but not ready today-he will contact me when ready.  HYPERTENSION, MODERATE Well-controlled. Continued irbesartan   Orders Placed This Encounter  Procedures  . Pneumococcal polysaccharide vaccine 23-valent greater than or equal to 2yo subcutaneous/IM

## 2014-02-14 NOTE — Patient Instructions (Addendum)
Tobacco abuse  Try adding nicotine patch 7 mg (that would be equal to 7 cigarettes or so)  Use nicotine gum as needed  Call [phone]  Call me when you are ready to quit and we can discuss one of the therapies I gave you a handout on.   Diabetes  Glad things are doing so much better! Hopeful we can get that a1c below 8  Foot exam looked great  Pneumonia vaccine today  Blood pressure  Looks great  Continue current medications  See me in 3-4 months or sooner if you get ready to set a quit date, Dr. Yong Channel

## 2014-03-07 ENCOUNTER — Ambulatory Visit: Payer: BC Managed Care – PPO | Admitting: Family

## 2014-03-21 ENCOUNTER — Ambulatory Visit: Payer: BC Managed Care – PPO | Admitting: Internal Medicine

## 2014-04-25 ENCOUNTER — Encounter: Payer: Self-pay | Admitting: Internal Medicine

## 2014-04-25 ENCOUNTER — Ambulatory Visit (INDEPENDENT_AMBULATORY_CARE_PROVIDER_SITE_OTHER): Payer: BC Managed Care – PPO | Admitting: Internal Medicine

## 2014-04-25 VITALS — BP 122/80 | HR 93 | Temp 98.3°F | Resp 12 | Wt 184.6 lb

## 2014-04-25 DIAGNOSIS — E1165 Type 2 diabetes mellitus with hyperglycemia: Secondary | ICD-10-CM

## 2014-04-25 DIAGNOSIS — IMO0001 Reserved for inherently not codable concepts without codable children: Secondary | ICD-10-CM

## 2014-04-25 LAB — HEMOGLOBIN A1C: Hgb A1c MFr Bld: 8.1 % — ABNORMAL HIGH (ref 4.6–6.5)

## 2014-04-25 MED ORDER — GLUCOSE BLOOD VI STRP: ORAL_STRIP | Status: DC

## 2014-04-25 MED ORDER — ACCU-CHEK FASTCLIX LANCETS MISC: Status: DC

## 2014-04-25 NOTE — Progress Notes (Signed)
Patient ID: Gabriel Hamilton, male   DOB: 1971-06-07, 43 y.o.   MRN: [id number]  HPI: Gabriel Hamilton is a 43 y.o.-year-old male, DM2,  dx 2007-8, non-insulin-dependent, uncontrolled, without complications. Last visit 2 mo ago.  Last hemoglobin A1c was: Lab Results  Component Value Date   HGBA1C 9.2* 12/13/2013   HGBA1C 10.1* 08/23/2013   HGBA1C 7.8 01/05/2013   Pt was on a regimen of: - Amaryl 4 mg daily in am - Kombiglyze XR 2.10-998 2x a day - not taken in 2 weeks as he could not afford the copay of 100$ per mo. He also tried [place] >> worked well. He also tried Onglyza in the past.   At last visit we switched to: - Metformin 1000 mg 2x a day, with meals. - Amaryl 4 mg daily in am. - Invokana 100 mg daily in am.  Pt checks his sugars 2x a day and they are MUCH BETTER - did not check in last month as he ran out of strips: - am: 160-200 (220-260 off Kombiglyze) >> 91-160, but did not check in last mo - 2h after b'fast: n/c >> 101-154 - before lunch: n/c >> 82-148 - 2h after lunch: n/c >> 95-155 - before dinner: 78-115 (180s off Kombiglyze) >> 80-118 - 2h after dinner: n/c >> 129-171 - bedtime: n/c >> 115-174, 207, 211 - nighttime: n/c No lows. Lowest sugar was 82; he has hypoglycemia awareness at 70.  Highest sugar was 280 >> 211.  Pt's meals are: - Breakfast: granola bar +/- fruit; may skip it - Lunch: salad, may skip - Dinner: meat (chicken, sausage, steak), veggies, starch (potatoes, rice); stews - Snacks:  Drinks water, ginger ale (regular).   He lost 30 lbs in the last years.  - no CKD, last BUN/creatinine:  Lab Results  Component Value Date   BUN 6 12/13/2013   CREATININE 0.9 12/13/2013  On Avapro. - last set of lipids: Lab Results  Component Value Date   CHOL 189 08/23/2013   HDL 44.10 08/23/2013   LDLCALC 83 08/23/2013   LDLDIRECT 88.4 07/15/2011   TRIG 310.0* 08/23/2013   CHOLHDL 4 08/23/2013   - last eye exam was in 11/29/2013 - Dr Gershon Crane. No DR.   - no numbness and tingling in his feet.  I reviewed pt's medications, allergies, PMH, social hx, family hx and no changes required, except as mentioned above.  ROS: Constitutional: no weight gain/loss, + fatigue, + subjective hyperthermia Eyes: no blurry vision, no xerophthalmia ENT: no sore throat, no nodules palpated in throat, no dysphagia/odynophagia, no hoarseness Cardiovascular: no CP /no SOB/palpitations/+ leg swelling Respiratory: no cough/SOB Gastrointestinal: no N/V/D/C, no heartburn Musculoskeletal: no muscle/+ joint aches Skin: no rashes Neurological: no tremors/numbness/tingling/dizziness, + HA + low libido, + diff with erections  PE: BP 122/80 mmHg  Pulse 93  Temp(Src) 98.3 F (36.8 C) (Oral)  Resp 12  Wt 184 lb 9.6 oz (83.734 kg)  SpO2 98% Wt Readings from Last 3 Encounters:  04/25/14 184 lb 9.6 oz (83.734 kg)  02/14/14 182 lb (82.555 kg)  01/31/14 184 lb (83.462 kg)   Constitutional: overweight, in NAD Eyes: PERRLA, EOMI, no exophthalmos ENT: moist mucous membranes, no thyromegaly, no cervical lymphadenopathy Cardiovascular: RRR, No MRG Respiratory: CTA B Gastrointestinal: abdomen soft, NT, ND, BS+ Musculoskeletal: no deformities, strength intact in all 4 Skin: moist, warm, no rashes Neurological: no tremor with outstretched hands, DTR normal in all 4  ASSESSMENT: 1. DM2, non-insulin-dependent, uncontrolled, without complications  PLAN:  1. Patient with long-standing, uncontrolled diabetes, on oral antidiabetic regimen, with great improvement after adding Invokana and changing diet - I suggested to continue:  Patient Instructions  Please continue: - Metformin 1000 mg 2x a day, with meals. - Amaryl 4 mg daily in am. - Invokana 100 mg daily in am. Keep up the great work! Please come back for a follow-up appointment in 3 months with your sugar log. - continue checking sugars at different times of the day - check 2 times a day, rotating checks -  advised for yearly eye exams >> he is UTD - Return to clinic in 3 mo with sugar log  - refilled lancets and strips - will check Hba1c and BMP now  Office Visit on 04/25/2014  Component Date Value Ref Range Status  . Hgb A1c MFr Bld 04/25/2014 8.1* 4.6 - 6.5 % Final   Glycemic Control Guidelines for People with Diabetes:Non Diabetic:  <6%Goal of Therapy: <7%Additional Action Suggested:  >8%   . Sodium 04/25/2014 138  135 - 145 mEq/L Final  . Potassium 04/25/2014 4.7  3.5 - 5.1 mEq/L Final  . Chloride 04/25/2014 101  96 - 112 mEq/L Final  . CO2 04/25/2014 25  19 - 32 mEq/L Final  . Glucose, Bld 04/25/2014 109* 70 - 99 mg/dL Final  . BUN 04/25/2014 11  6 - 23 mg/dL Final  . Creatinine, Ser 04/25/2014 0.9  0.4 - 1.5 mg/dL Final  . Calcium 04/25/2014 9.5  8.4 - 10.5 mg/dL Final  . GFR 04/25/2014 111.81  >60.00 mL/min Final   Normal potassium and kidney function. Hemoglobin A1c decreased!

## 2014-04-25 NOTE — Patient Instructions (Signed)
Please continue: - Metformin 1000 mg 2x a day, with meals. - Amaryl 4 mg daily in am. - Invokana 100 mg daily in am.  Keep up the great work!  Please come back for a follow-up appointment in 3 months with your sugar log.

## 2014-04-27 LAB — BASIC METABOLIC PANEL
BUN: 11 mg/dL (ref 6–23)
CO2: 25 mEq/L (ref 19–32)
Calcium: 9.5 mg/dL (ref 8.4–10.5)
Chloride: 101 mEq/L (ref 96–112)
Creatinine, Ser: 0.9 mg/dL (ref 0.4–1.5)
GFR: 111.81 mL/min (ref >60.00)
Glucose, Bld: 109 mg/dL — ABNORMAL HIGH (ref 70–99)
Potassium: 4.7 mEq/L (ref 3.5–5.1)
Sodium: 138 mEq/L (ref 135–145)

## 2014-05-06 ENCOUNTER — Other Ambulatory Visit: Payer: Self-pay | Admitting: Internal Medicine

## 2014-06-06 ENCOUNTER — Ambulatory Visit (INDEPENDENT_AMBULATORY_CARE_PROVIDER_SITE_OTHER): Payer: BC Managed Care – PPO | Admitting: Family Medicine

## 2014-06-06 ENCOUNTER — Encounter: Payer: Self-pay | Admitting: Family Medicine

## 2014-06-06 VITALS — BP 110/80 | HR 78 | Temp 97.7°F | Wt 186.0 lb

## 2014-06-06 DIAGNOSIS — I10 Essential (primary) hypertension: Secondary | ICD-10-CM

## 2014-06-06 DIAGNOSIS — Z720 Tobacco use: Secondary | ICD-10-CM

## 2014-06-06 DIAGNOSIS — E781 Pure hyperglyceridemia: Secondary | ICD-10-CM

## 2014-06-06 DIAGNOSIS — E785 Hyperlipidemia, unspecified: Secondary | ICD-10-CM | POA: Insufficient documentation

## 2014-06-06 DIAGNOSIS — M79662 Pain in left lower leg: Secondary | ICD-10-CM

## 2014-06-06 NOTE — Assessment & Plan Note (Signed)
Discussed need for exercise and improved dm control. If cannot get to less than 200 in 6 months, would strongly advocate a statin.

## 2014-06-06 NOTE — Progress Notes (Signed)
Garret Reddish, MD Phone: [phone]  Subjective:   Gabriel Hamilton is a 43 y.o. year old very pleasant male patient who presents with the following:  Left calf pain- new issue -Off and on lower leg pain for 6 months. Ranges from ankle to calf. Mild to moderate.Rarely will feel on left arm or left leg. Tried ginko otc product minimal help. Feels like pulled muscle. He is worried about his "blood flow" and has not mentioned pain to endocrine. Not in his feet but more calf and sometimes in his ankle.   Patient is a smoker with 1 pack per 3 days. No claudication type symptoms. No pain with walking- often an aching when he is at rest.   ROS- Patient denies chest pain, shortness of breath, dyspnea on exertion, orthopnea, PND,  history DVT, active cancer, unilateral calf swelling, superficial veins present, swelling of entire leg, localized tenderness in calf, pitting edema greater in symptomatic leg, paralysis.   Hypertension-well controlled  BP Readings from Last 3 Encounters:  06/06/14 110/80  04/25/14 122/80  02/14/14 125/78  Home BP monitoring-no Compliant with medications-yes without side effects ROS-Denies any CP, HA, SOB, blurry vision, LE edema  Hypertriglyceridemia-poor control  Range in 200s to 300s over last few years, never on statin On statin: no Regular exercise: no, advised and patient states he has recently started exercising some ROS- no chest pain or shortness of breath. No myalgias  Past Medical History- Patient Active Problem List   Diagnosis Date Noted  . Tobacco abuse 12/22/2011    Priority: High  . Uncontrolled diabetes mellitus type 2 without complications 69/48/5462    Priority: High  . Essential hypertension 07/10/2009    Priority: Medium  . Pain of left calf 06/06/2014    Priority: Low  . Tachycardia 02/14/2014    Priority: Low  . Acne     Priority: Low  . Polycythemia 08/26/2011    Priority: Low  . HYPOGONADISM 11/21/2009    Priority: Low  .  GERD 03/27/2008    Priority: Low  . HEART MURMUR 03/27/2008    Priority: Low   Medications- reviewed and updated Current Outpatient Prescriptions  Medication Sig Dispense Refill  . ACCU-CHEK FASTCLIX LANCETS MISC Test 2x a day 100 each 11  . ampicillin (PRINCIPEN) 500 MG capsule Take 500 mg by mouth 2 (two) times daily.     [name] glimepiride (AMARYL) 4 MG tablet Take one tablet by mouth every day before breakfast. 30 tablet 11  . glucose blood (ACCU-CHEK SMARTVIEW) test strip Test 2x a day 100 each 11  . INVOKANA 100 MG TABS tablet TAKE ONE TABLET BY MOUTH EVERY MORNING  30 tablet 1  . irbesartan (AVAPRO) 150 MG tablet Take 1 tablet (150 mg total) by mouth at bedtime. 30 tablet 6  . metFORMIN (GLUCOPHAGE) 1000 MG tablet Take 1 tablet (1,000 mg total) by mouth 2 (two) times daily with a meal. 60 tablet 3  . omeprazole (PRILOSEC) 20 MG capsule Take 20 mg by mouth daily.    . naproxen sodium (ALEVE) 220 MG tablet Take 220 mg by mouth at bedtime as needed.     No current facility-administered medications for this visit.    Objective: BP 110/80 mmHg  Pulse 78  Temp(Src) 97.7 F (36.5 C)  Wt 186 lb (84.369 kg) Gen: NAD, resting comfortably in chair CV: RRR no murmurs rubs or gallops Lungs: CTAB no crackles, wheeze, rhonchi Abdomen: soft/nontender/nondistended/normal bowel sounds.  Ext: no edema, 2+ DP and PT pulses  bilaterally. Equal calf size. Left calf described as having a soreness but cannot palpate any pain or cords.  Skin: warm, dry, thickened toenails no surrounding erythema Neuro: grossly normal, moves all extremities   Assessment/Plan:  Tobacco abuse Advised cessation, new years goal for patient.   Essential hypertension Well controlled on irbesartan 150mg . Good choice with diabetes.   Pain of left calf Need to rule out chronic DVT. 2+ pulses DP and PT and not typical of claudication. Slightly atypical as patient states this pain is getting better with improved a1c control.  Suspect nonspecific MSK pain but evaluated for more serious pathology. Unilateral calf and ankle pain not typical for neuropathy from DM.   Hypertriglyceridemia Discussed need for exercise and improved dm control. If cannot get to less than 200 in 6 months, would strongly advocate a statin.   Return precautions advised. 6 month follow up planned if workup unremarkable.   Orders Placed This Encounter  Procedures  . Lower Extremity Venous Duplex    Standing Status: Future     Number of Occurrences:      Standing Expiration Date: 06/07/2015    Order Specific Question:  Where should this test be performed:    Answer:  [facility]    No orders of the defined types were placed in this encounter.

## 2014-06-06 NOTE — Assessment & Plan Note (Addendum)
Need to rule out chronic DVT. 2+ pulses DP and PT and not typical of claudication. Slightly atypical as patient states this pain is getting better with improved a1c control. Suspect nonspecific MSK pain but evaluated for more serious pathology. Unilateral calf and ankle pain not typical for neuropathy from DM.

## 2014-06-06 NOTE — Patient Instructions (Addendum)
You have good pulses and I doubt this is an arterial issue. I have seen clotting in venous system cause some pain so I want to rule this out though I think it is low likelihood.   Blood pressure looks great-no changes.  Advise after the duplex that you get to work on your exercise regimen-advised 150 minutes per week.  Cholesterol (triglycerides are high, rest ok), hopefully with improved diabetes and exercise we can get levels out of the range that we need to treat with a statin/cholesterol medication.   See me in 6 months in AM and we will update your fasting cholesterol and see where you stand. Keep seeing Dr. Cruzita Lederer every 3 months.

## 2014-06-06 NOTE — Assessment & Plan Note (Signed)
Advised cessation, new years goal for patient.

## 2014-06-06 NOTE — Assessment & Plan Note (Signed)
Well controlled on irbesartan 150mg . Good choice with diabetes.

## 2014-06-14 ENCOUNTER — Other Ambulatory Visit: Payer: Self-pay | Admitting: Internal Medicine

## 2014-06-18 ENCOUNTER — Encounter (HOSPITAL_COMMUNITY): Payer: BC Managed Care – PPO

## 2014-06-27 ENCOUNTER — Encounter (HOSPITAL_COMMUNITY): Payer: Self-pay

## 2014-07-24 ENCOUNTER — Other Ambulatory Visit: Payer: Self-pay | Admitting: Internal Medicine

## 2014-07-25 ENCOUNTER — Ambulatory Visit: Payer: BC Managed Care – PPO | Admitting: Internal Medicine

## 2014-08-01 ENCOUNTER — Ambulatory Visit (INDEPENDENT_AMBULATORY_CARE_PROVIDER_SITE_OTHER): Payer: BLUE CROSS/BLUE SHIELD | Admitting: Podiatry

## 2014-08-01 ENCOUNTER — Encounter: Payer: Self-pay | Admitting: Podiatry

## 2014-08-01 ENCOUNTER — Telehealth (HOSPITAL_COMMUNITY): Payer: Self-pay | Admitting: *Deleted

## 2014-08-01 VITALS — BP 161/94 | HR 79 | Resp 13 | Ht 68.5 in | Wt 185.0 lb

## 2014-08-01 DIAGNOSIS — B351 Tinea unguium: Secondary | ICD-10-CM

## 2014-08-01 DIAGNOSIS — L600 Ingrowing nail: Secondary | ICD-10-CM

## 2014-08-01 NOTE — Patient Instructions (Signed)
Will contact you with the results of the lower extremity arterial Doppler for possible consideration of nail surgery for permanent correction  Diabetes and Foot Care Diabetes may cause you to have problems because of poor blood supply (circulation) to your feet and legs. This may cause the skin on your feet to become thinner, break easier, and heal more slowly. Your skin may become dry, and the skin may peel and crack. You may also have nerve damage in your legs and feet causing decreased feeling in them. You may not notice minor injuries to your feet that could lead to infections or more serious problems. Taking care of your feet is one of the most important things you can do for yourself.  HOME CARE INSTRUCTIONS  Wear shoes at all times, even in the house. Do not go barefoot. Bare feet are easily injured.  Check your feet daily for blisters, cuts, and redness. If you cannot see the bottom of your feet, use a mirror or ask someone for help.  Wash your feet with warm water (do not use hot water) and mild soap. Then pat your feet and the areas between your toes until they are completely dry. Do not soak your feet as this can dry your skin.  Apply a moisturizing lotion or petroleum jelly (that does not contain alcohol and is unscented) to the skin on your feet and to dry, brittle toenails. Do not apply lotion between your toes.  Trim your toenails straight across. Do not dig under them or around the cuticle. File the edges of your nails with an emery board or nail file.  Do not cut corns or calluses or try to remove them with medicine.  Wear clean socks or stockings every day. Make sure they are not too tight. Do not wear knee-high stockings since they may decrease blood flow to your legs.  Wear shoes that fit properly and have enough cushioning. To break in new shoes, wear them for just a few hours a day. This prevents you from injuring your feet. Always look in your shoes before you put them on  to be sure there are no objects inside.  Do not cross your legs. This may decrease the blood flow to your feet.  If you find a minor scrape, cut, or break in the skin on your feet, keep it and the skin around it clean and dry. These areas may be cleansed with mild soap and water. Do not cleanse the area with peroxide, alcohol, or iodine.  When you remove an adhesive bandage, be sure not to damage the skin around it.  If you have a wound, look at it several times a day to make sure it is healing.  Do not use heating pads or hot water bottles. They may burn your skin. If you have lost feeling in your feet or legs, you may not know it is happening until it is too late.  Make sure your health care provider performs a complete foot exam at least annually or more often if you have foot problems. Report any cuts, sores, or bruises to your health care provider immediately. SEEK MEDICAL CARE IF:   You have an injury that is not healing.  You have cuts or breaks in the skin.  You have an ingrown nail.  You notice redness on your legs or feet.  You feel burning or tingling in your legs or feet.  You have pain or cramps in your legs and feet.  Your  legs or feet are numb.  Your feet always feel cold. SEEK IMMEDIATE MEDICAL CARE IF:   There is increasing redness, swelling, or pain in or around a wound.  There is a red line that goes up your leg.  Pus is coming from a wound.  You develop a fever or as directed by your health care provider.  You notice a bad smell coming from an ulcer or wound. Document Released: 06/05/2000 Document Revised: 02/08/2013 Document Reviewed: 11/15/2012 [facility] Patient Information 2015 [place], [place]. This information is not intended to replace advice given to you by your health care provider. Make sure you discuss any questions you have with your health care provider.

## 2014-08-01 NOTE — Progress Notes (Signed)
   Subjective:    Patient ID: Gabriel Hamilton, male    DOB: 12/14/1970, 44 y.o.   MRN: [id number]  HPI Comments: N foot pain L B/L overall foot D 1 year O off and on C burning, stinging, itching and aching A without stimulation T none  Pt states the toenails are mostly thickened, and ingrown and would like to discuss removal.  Foot Pain Associated symptoms include arthralgias, chest pain, chills, diaphoresis, fatigue, a fever, headaches and numbness.  Diabetes Hypoglycemia symptoms include headaches. Associated symptoms include chest pain and fatigue.    He denies any history of ulceration or claudication  He denies any history infection in the toenails The primary concern seems to be the painful hallux toenails   Review of Systems  Constitutional: Positive for fever, chills, diaphoresis and fatigue.  Eyes: Positive for pain and itching.  Respiratory: Positive for chest tightness.   Cardiovascular: Positive for chest pain.  Gastrointestinal: Positive for abdominal distention.  Musculoskeletal: Positive for arthralgias.  Skin:       Keloids. Change in nails.  Neurological: Positive for numbness and headaches.  Hematological:       Slow to heal  All other systems reviewed and are negative.      Objective:   Physical Exam  Orientated 3  Vascular: DP pulses 2/4 bilaterally PT pulses 2/4 bilaterally Capillary reflex immediate bilaterally  Neurological: Ankle reflex equal and reactive bilaterally Vibratory sensation intact bilaterally Sensation to 10 g monofilament wire intact 5/5 bilaterally  Dermatological: Dry skin bilaterally The toenails 1-5 bilaterally are discolored, and hypertrophic The hallux toenails are incurvated incurvated without any erythema, edema surrounding the hallux nail margins  Musculoskeletal: Pes planus bilaterally HAV deformities bilaterally          Assessment & Plan:   Assessment: Apparent adequate vascular  status Evaluate toe brachial index for consideration of possible hallux nail surgery Detective sensation intact bilaterally Mycotic toenails 6-10 Ingrowing hallux toenails bilaterally  Plan: Order lower extremity arterial Doppler with toe brachial index for the indication of diabetic with possible consideration of nail surgery on the hallux nails bilaterally  Reviewed results of diabetic foot exam today Discuss treatment options for mycotic toenails including no treatment, topical, repetitive debridement.  Patient seems to be most concerned about the this discomfort and incurvated hallux toenails and even if the suspect fungal infection resolve the hallux nails are extremely incurvated. I made patient aware that we need to carefully evaluate his vascular status, diabetic control and tobacco abuse before any surgical procedure was performed.  Patient's last A1c was 8.1 Has a history of tobacco abuse After receive the results of the arterial Doppler with TBI will need to consult and get medical clearance for any proposed nail surgery.

## 2014-08-07 ENCOUNTER — Telehealth (HOSPITAL_COMMUNITY): Payer: Self-pay | Admitting: *Deleted

## 2014-09-05 ENCOUNTER — Encounter: Payer: Self-pay | Admitting: Internal Medicine

## 2014-09-05 ENCOUNTER — Ambulatory Visit (INDEPENDENT_AMBULATORY_CARE_PROVIDER_SITE_OTHER): Payer: Self-pay | Admitting: Internal Medicine

## 2014-09-05 VITALS — BP 118/70 | HR 97 | Temp 97.8°F | Resp 12 | Wt 187.0 lb

## 2014-09-05 DIAGNOSIS — IMO0001 Reserved for inherently not codable concepts without codable children: Secondary | ICD-10-CM

## 2014-09-05 DIAGNOSIS — E1165 Type 2 diabetes mellitus with hyperglycemia: Secondary | ICD-10-CM

## 2014-09-05 LAB — HEMOGLOBIN A1C: HEMOGLOBIN A1C: 7.1 % — AB (ref 4.6–6.5)

## 2014-09-05 NOTE — Progress Notes (Signed)
Patient ID: Gabriel Hamilton, male   DOB: 04-Jul-1970, 44 y.o.   MRN: [id number]  HPI: Gabriel Hamilton is a 44 y.o.-year-old male, DM2,  dx 2007-8, non-insulin-dependent, uncontrolled, without complications. Last visit 4 mo ago.  Last hemoglobin A1c was: Lab Results  Component Value Date   HGBA1C 8.1* 04/25/2014   HGBA1C 9.2* 12/13/2013   HGBA1C 10.1* 08/23/2013   At last visit we switched to: - Metformin 1000 mg 2x a day, with meals. - Amaryl 4 mg daily in am. - Invokana 100 mg daily in am. He also tried [place] >> worked well. He also tried Onglyza in the past.   Pt checks his sugars 2x a day and they are higher in am (no log) - great later in the day: - am: 160-200 (220-260 off Kombiglyze) >> 91-160 >> 121-170 (eats late at night) - 2h after b'fast: n/c >> 101- 154 >> ~130 - before lunch: n/c >> 82-148 >> 120-130 - 2h after lunch: n/c >> 95-155 >> 100-115 - before dinner: 78-115 (180s off Kombiglyze) >> 80-118 >> 91-100 - 2h after dinner: n/c >> 129-171 >> n/c - bedtime: n/c >> 115-174, 207, 211 >> 130s - nighttime: n/c No lows. Lowest sugar was 82 >> 87; he has hypoglycemia awareness at 70.  Highest sugar was 280 >> 211 >> 188  Pt's meals are: - Breakfast: granola bar +/- fruit; may skip it - Lunch: salad, may skip - Dinner: meat (chicken, sausage, steak), veggies, starch (potatoes, rice); stews - Snack: walnuts, trailmix Drinks water, ginger ale (regular).   He lost 30 lbs in the last years. Now stable. Will start exercising.  - no CKD, last BUN/creatinine:  Lab Results  Component Value Date   BUN 11 04/25/2014   CREATININE 0.9 04/25/2014  On Avapro. - last set of lipids: Lab Results  Component Value Date   CHOL 189 08/23/2013   HDL 44.10 08/23/2013   LDLCALC 83 08/23/2013   LDLDIRECT 88.4 07/15/2011   TRIG 310.0* 08/23/2013   CHOLHDL 4 08/23/2013   - last eye exam was in 11/29/2013 - Dr Gershon Crane. No DR.  - no numbness and tingling in his feet.  I reviewed  pt's medications, allergies, PMH, social hx, family hx, and changes were documented in the history of present illness. Otherwise, unchanged from my initial visit note.  ROS: Constitutional: no weight gain/loss, n+ fatigue, no subjective hyperthermia Eyes: no blurry vision, no xerophthalmia ENT: no sore throat, no nodules palpated in throat, no dysphagia/odynophagia, no hoarseness Cardiovascular: no CP /no SOB/palpitations/+ leg swelling Respiratory: no cough/SOB Gastrointestinal: no N/V/D/C, no heartburn Musculoskeletal: + muscle/+ joint aches Skin: no rashes Neurological: no tremors/numbness/tingling/dizziness, + HA + low libido, + diff with erections  PE: BP 118/70 mmHg  Pulse 97  Temp(Src) 97.8 F (36.6 C) (Oral)  Resp 12  Wt 187 lb (84.823 kg)  SpO2 96% Body mass index is 28.02 kg/(m^2).  Wt Readings from Last 3 Encounters:  09/05/14 187 lb (84.823 kg)  08/01/14 185 lb (83.915 kg)  06/06/14 186 lb (84.369 kg)   Constitutional: overweight, in NAD Eyes: PERRLA, EOMI, no exophthalmos ENT: moist mucous membranes, no thyromegaly, no cervical lymphadenopathy Cardiovascular: RRR, No MRG Respiratory: CTA B Gastrointestinal: abdomen soft, NT, ND, BS+ Musculoskeletal: no deformities, strength intact in all 4 Skin: moist, warm, no rashes Neurological: no tremor with outstretched hands, DTR normal in all 4  ASSESSMENT: 1. DM2, non-insulin-dependent, uncontrolled, without complications  PLAN:  1. Patient with long-standing, uncontrolled diabetes, on oral  antidiabetic regimen, with great improvement after adding Invokana and changing diet. He still has higher sugars in am >> will add a 1/2 Amaryl before a larger dinner. OTW, continue current regimen. - I suggested to continue:  Patient Instructions  Please continue: - Metformin 1000 mg 2x a day, with meals. - Amaryl 4 mg daily in am. You can add 1/2 tablet with dinner if you have a larger dinner. - Invokana 100 mg daily in  am.  Please come back for a follow-up appointment in 3 months with your sugar log.  - continue checking sugars at different times of the day - check 2 times a day, rotating checks - advised for yearly eye exams >> he is UTD - will check Hba1c now - Return to clinic in 3 mo with sugar log   Office Visit on 09/05/2014  Component Date Value Ref Range Status  . Hgb A1c MFr Bld 09/05/2014 7.1* 4.6 - 6.5 % Final   Glycemic Control Guidelines for People with Diabetes:Non Diabetic:  <6%Goal of Therapy: <7%Additional Action Suggested:  >8%    Excellent HbA1c!

## 2014-09-05 NOTE — Patient Instructions (Signed)
Please continue: - Metformin 1000 mg 2x a day, with meals. - Amaryl 4 mg daily in am. You can add 1/2 tablet with dinner if you have a larger dinner. - Invokana 100 mg daily in am.  Please come back for a follow-up appointment in 3 months with your sugar log.

## 2014-09-18 ENCOUNTER — Other Ambulatory Visit: Payer: Self-pay

## 2014-09-18 MED ORDER — GLIMEPIRIDE 4 MG PO TABS: ORAL_TABLET | ORAL | Status: DC

## 2014-09-18 NOTE — Telephone Encounter (Signed)
Rx request for glimepiride 4 mg tablet-Take 1 tablet by mouth every day before breakfast #30  Pharm:  [facility]

## 2014-09-29 ENCOUNTER — Other Ambulatory Visit: Payer: Self-pay | Admitting: Internal Medicine

## 2014-11-14 ENCOUNTER — Ambulatory Visit (INDEPENDENT_AMBULATORY_CARE_PROVIDER_SITE_OTHER): Payer: BLUE CROSS/BLUE SHIELD | Admitting: Physician Assistant

## 2014-11-14 VITALS — BP 120/82 | HR 114 | Temp 98.4°F | Resp 16 | Ht 68.75 in | Wt 187.0 lb

## 2014-11-14 DIAGNOSIS — S81801A Unspecified open wound, right lower leg, initial encounter: Secondary | ICD-10-CM

## 2014-11-14 DIAGNOSIS — R238 Other skin changes: Secondary | ICD-10-CM | POA: Diagnosis not present

## 2014-11-14 DIAGNOSIS — T148XXA Other injury of unspecified body region, initial encounter: Secondary | ICD-10-CM

## 2014-11-14 LAB — POCT CBC
GRANULOCYTE PERCENT: 61.7 % (ref 37–80)
HEMATOCRIT: 54.1 % — AB (ref 43.5–53.7)
HEMOGLOBIN: 17.5 g/dL (ref 14.1–18.1)
Lymph, poc: 2.1 (ref 0.6–3.4)
MCH: 29.2 pg (ref 27–31.2)
MCHC: 32.3 g/dL (ref 31.8–35.4)
MCV: 90.3 fL (ref 80–97)
MID (cbc): 0.4 (ref 0–0.9)
MPV: 7.4 fL (ref 0–99.8)
PLATELET COUNT, POC: 363 10*3/uL (ref 142–424)
POC Granulocyte: 3.9 (ref 2–6.9)
POC LYMPH PERCENT: 32.6 %L (ref 10–50)
POC MID %: 5.7 %M (ref 0–12)
RBC: 5.99 M/uL (ref 4.69–6.13)
RDW, POC: 15.4 %
WBC: 6.3 10*3/uL (ref 4.6–10.2)

## 2014-11-14 MED ORDER — DOXYCYCLINE HYCLATE 100 MG PO CAPS: 100.0000 mg | ORAL_CAPSULE | Freq: Two times a day (BID) | ORAL | Status: DC

## 2014-11-14 NOTE — Patient Instructions (Signed)
You most likely have a skin infection front of your right leg. Please take the doxycycline twice daily for 10 days for this. You can stop taking the ampicillin while on the doxy. Be sure to keep it covered when you're active. Non adherent dressings so that you don't pull any skin off.  Please come back to see [place] if you feel it is getting worse or you start having fevers or chills.

## 2014-11-14 NOTE — Progress Notes (Signed)
Subjective:    Patient ID: Gabriel Hamilton, male    DOB: [birth year], 44 y.o.   MRN: [id number]  Chief Complaint  Patient presents with  . Sore on leg    Right calf, hit leg on desk at work, been draining for last week and a half   Prior to Admission medications   Medication Sig Start Date End Date Taking? Authorizing Provider  ACCU-CHEK FASTCLIX LANCETS MISC Test 2x a day 04/25/14  Yes Philemon Kingdom, MD  ampicillin (PRINCIPEN) 500 MG capsule Take 500 mg by mouth 2 (two) times daily.  05/31/13  Yes Historical Provider, MD  glimepiride (AMARYL) 4 MG tablet Take one tablet by mouth every day before breakfast. 09/18/14  Yes Marin Olp, MD  glucose blood (ACCU-CHEK SMARTVIEW) test strip Test 2x a day 04/25/14  Yes Philemon Kingdom, MD  INVOKANA 100 MG TABS tablet TAKE ONE TABLET BY MOUTH EVERY MORNING 09/30/14  Yes Philemon Kingdom, MD  metFORMIN (GLUCOPHAGE) 1000 MG tablet TAKE ONE TABLET TWICE DAILY WITH MEALS 09/30/14  Yes Philemon Kingdom, MD  omeprazole (PRILOSEC) 20 MG capsule Take 20 mg by mouth daily.   Yes Historical Provider, MD  clindamycin (CLEOCIN T) 1 % external solution  08/15/14   Historical Provider, MD   Medications, allergies, past medical history, surgical history, family history, social history and problem list reviewed and updated.  HPI  44 yom with pmh uncontrolled dm2 presents with wound right leg.   Bumped right shin on desk 2 months ago. Healed after couple wks but then started swelling slightly and getting sore. Has been draining past 10 days. Put bandaid on it which ripped skin and may worse last week.   Denies fevers, chills at home. Elevated HR today. This is slightly higher than normal for pt but his HRs are typically high 90s low 100s at prior visits.   Per pt he has hx poorly controlled dm2. Last A1C better at 7.1. He sees derm as he freq has poorly healing wounds. Has been taking ampicillin 500 bid for years for freq skin infx. Uses topical clinda prn.     Review of Systems See HPI.     Objective:   Physical Exam  Constitutional: He appears well-developed and well-nourished.  Non-toxic appearance. He does not have a sickly appearance. He does not appear ill. No distress.  BP 120/82 mmHg  Pulse 114  Temp(Src) 98.4 F (36.9 C) (Oral)  Resp 16  Ht 5' 8.75" (1.746 m)  Wt 187 lb (84.823 kg)  BMI 27.82 kg/m2  SpO2 98%   Skin:     Approx 1cm x 1cm wound right shin. Slightly indurated. Slight warm. Surrsounding erythema. Mild amnt purulence draining from center. Mild ttp over area. Culture obtained.   Small pustule approx 1.5" below wound.    Results for orders placed or performed in visit on 11/14/14  POCT CBC  Result Value Ref Range   WBC 6.3 4.6 - 10.2 K/uL   Lymph, poc 2.1 0.6 - 3.4   POC LYMPH PERCENT 32.6 10 - 50 %L   MID (cbc) 0.4 0 - 0.9   POC MID % 5.7 0 - 12 %M   POC Granulocyte 3.9 2 - 6.9   Granulocyte percent 61.7 37 - 80 %G   RBC 5.99 4.69 - 6.13 M/uL   Hemoglobin 17.5 14.1 - 18.1 g/dL   HCT, POC 54.1 (A) 43.5 - 53.7 %   MCV 90.3 80 - 97 fL   MCH, POC 29.2  27 - 31.2 pg   MCHC 32.3 31.8 - 35.4 g/dL   RDW, POC 15.4 %   Platelet Count, POC 363 142 - 424 K/uL   MPV 7.4 0 - 99.8 fL      Assessment & Plan:   44 yom with pmh uncontrolled dm2 presents with wound right leg.   Wound of skin - Plan: Wound culture, POCT CBC, doxycycline (VIBRAMYCIN) 100 MG capsule --skin infection in this poorly controlled diabetic --vitals normal other than mildly elevated HR for pt, no fevers, chills at home --doxy 10 days bid --wound cx sent --wrapped in clinic, instructed to keep wrapped with non adhesive   Julieta Gutting, PA-C Physician Assistant-Certified [facility]  11/14/2014 2:59 PM

## 2014-11-17 LAB — WOUND CULTURE
GRAM STAIN: NONE SEEN
Gram Stain: NONE SEEN
Gram Stain: NONE SEEN

## 2014-11-21 ENCOUNTER — Ambulatory Visit: Payer: BLUE CROSS/BLUE SHIELD | Admitting: Internal Medicine

## 2014-12-05 ENCOUNTER — Encounter: Payer: Self-pay | Admitting: Family Medicine

## 2014-12-05 ENCOUNTER — Ambulatory Visit: Payer: BC Managed Care – PPO | Admitting: Family Medicine

## 2014-12-05 ENCOUNTER — Ambulatory Visit (INDEPENDENT_AMBULATORY_CARE_PROVIDER_SITE_OTHER): Payer: BLUE CROSS/BLUE SHIELD | Admitting: Family Medicine

## 2014-12-05 ENCOUNTER — Other Ambulatory Visit: Payer: Self-pay | Admitting: Internal Medicine

## 2014-12-05 VITALS — BP 130/86 | HR 70 | Temp 98.4°F | Wt 186.0 lb

## 2014-12-05 DIAGNOSIS — Z720 Tobacco use: Secondary | ICD-10-CM

## 2014-12-05 DIAGNOSIS — E1165 Type 2 diabetes mellitus with hyperglycemia: Secondary | ICD-10-CM | POA: Diagnosis not present

## 2014-12-05 DIAGNOSIS — Z7251 High risk heterosexual behavior: Secondary | ICD-10-CM | POA: Diagnosis not present

## 2014-12-05 DIAGNOSIS — I10 Essential (primary) hypertension: Secondary | ICD-10-CM

## 2014-12-05 DIAGNOSIS — E781 Pure hyperglyceridemia: Secondary | ICD-10-CM

## 2014-12-05 DIAGNOSIS — IMO0002 Reserved for concepts with insufficient information to code with codable children: Secondary | ICD-10-CM

## 2014-12-05 LAB — COMPREHENSIVE METABOLIC PANEL
ALBUMIN: 4.3 g/dL (ref 3.5–5.2)
ALT: 11 U/L (ref 0–53)
AST: 17 U/L (ref 0–37)
Alkaline Phosphatase: 62 U/L (ref 39–117)
BUN: 9 mg/dL (ref 6–23)
CALCIUM: 9.6 mg/dL (ref 8.4–10.5)
CHLORIDE: 102 meq/L (ref 96–112)
CO2: 32 mEq/L (ref 19–32)
Creatinine, Ser: 0.93 mg/dL (ref 0.40–1.50)
GFR: 112.89 mL/min (ref >60.00)
Glucose, Bld: 187 mg/dL — ABNORMAL HIGH (ref 70–99)
POTASSIUM: 4.8 meq/L (ref 3.5–5.1)
Sodium: 139 mEq/L (ref 135–145)
Total Bilirubin: 0.7 mg/dL (ref 0.2–1.2)
Total Protein: 7.3 g/dL (ref 6.0–8.3)

## 2014-12-05 LAB — MICROALBUMIN / CREATININE URINE RATIO
Creatinine,U: 132.3 mg/dL
MICROALB UR: 1.6 mg/dL (ref 0.0–1.9)
MICROALB/CREAT RATIO: 1.2 mg/g (ref 0.0–30.0)

## 2014-12-05 LAB — LIPID PANEL
Cholesterol: 150 mg/dL (ref 0–200)
HDL: 49.2 mg/dL (ref >39.00)
LDL Cholesterol: 74 mg/dL (ref 0–99)
NONHDL: 100.8
Total CHOL/HDL Ratio: 3
Triglycerides: 134 mg/dL (ref 0.0–149.0)
VLDL: 26.8 mg/dL (ref 0.0–40.0)

## 2014-12-05 NOTE — Assessment & Plan Note (Signed)
S:Irbesartan in past. Thought patient taking at last visit but we discussed today and believe he was actually referencing the invokana. Now controlled without BP med directly but invokana alone A/P: continue without ARB as long as microalb/cr ratio not elevated. Invokana without BP meds keeping levels controlled.

## 2014-12-05 NOTE — Patient Instructions (Addendum)
Get eye exam scheduled and have them fax report to [place] at [phone].  Check labs today including urine to see if any protein in urine from diabetes (would need a medicine to protect kidneys if so)  If triglycerides >200, I will recommend a cholesterol medication to protect your heart  COntinue to encourage you to quit smoking. Let me know when you are ready.   Try relion brand lancets to see if that can save you any $

## 2014-12-05 NOTE — Assessment & Plan Note (Signed)
S: much improved control under Dr. Cruzita Lederer. Asks me for less expensive options for lancets A/P: advised to try relion lancets. Also check urine microalbumin/cr ratio today. Thankful for invokana as believe helping BP as well.

## 2014-12-05 NOTE — Progress Notes (Signed)
Garret Reddish, MD  Subjective:  Gabriel Hamilton is a 44 y.o. year old very pleasant male patient who presents with:  See problem oriented charting- ROS- no blurry vision, chest pain, shortness of breath, lightheadedness  Past Medical History- polycythemia secondary to smoking, acne  Medications- reviewed and updated Current Outpatient Prescriptions  Medication Sig Dispense Refill  . ACCU-CHEK FASTCLIX LANCETS MISC Test 2x a day 100 each 11  . glimepiride (AMARYL) 4 MG tablet Take one tablet by mouth every day before breakfast. 30 tablet 11  . glucose blood (ACCU-CHEK SMARTVIEW) test strip Test 2x a day 100 each 11  . INVOKANA 100 MG TABS tablet TAKE ONE TABLET BY MOUTH EVERY MORNING 30 tablet 1  . metFORMIN (GLUCOPHAGE) 1000 MG tablet TAKE ONE TABLET TWICE DAILY WITH MEALS 60 tablet 2  . omeprazole (PRILOSEC) 20 MG capsule Take 20 mg by mouth daily.     Objective: BP 130/86 mmHg  Pulse 70  Temp(Src) 98.4 F (36.9 C)  Wt 186 lb (84.369 kg) Gen: NAD, resting comfortably in chair CV: RRR no murmurs rubs or gallops Lungs: CTAB no crackles, wheeze, rhonchi Abdomen: soft/nontender/nondistended/normal bowel sounds.  Ext: no edema Skin: warm, dry, acne noted, nodular area on right shin healing from prior wound infection- now completely closed Neuro: grossly normal, moves all extremities  Assessment/Plan:  Uncontrolled diabetes mellitus type 2 without complications S: much improved control under Dr. Cruzita Lederer. Asks me for less expensive options for lancets A/P: advised to try relion lancets. Also check urine microalbumin/cr ratio today. Thankful for invokana as believe helping BP as well.    Tobacco abuse S: advised cessation from 1/2 PPD  A/P: patient not ready to quit- wants to focus on upping exercise to have this as a stress reliever first.    Hypertriglyceridemia Check lipids today. Discussed I would advise statin if trig 200-500. Patient wants to work on exercise first  before starting statin but could always pull off statin later if patient does lose weight and gets into regular exercise.   Essential hypertension S:Irbesartan in past. Thought patient taking at last visit but we discussed today and believe he was actually referencing the invokana. Now controlled without BP med directly but invokana alone A/P: continue without ARB as long as microalb/cr ratio not elevated. Invokana without BP meds keeping levels controlled.    6 month follow up.   Fasting labs Orders Placed This Encounter  Procedures  . Microalbumin/Creatinine Ratio, Urine  . Comprehensive metabolic panel    [place]    Order Specific Question:  Has the patient fasted?    Answer:  No  . Lipid panel    [facility]    Order Specific Question:  Has the patient fasted?    Answer:  No  . HIV antibody    [facility]

## 2014-12-05 NOTE — Assessment & Plan Note (Signed)
Check lipids today. Discussed I would advise statin if trig 200-500. Patient wants to work on exercise first before starting statin but could always pull off statin later if patient does lose weight and gets into regular exercise.

## 2014-12-05 NOTE — Assessment & Plan Note (Signed)
S: advised cessation from 1/2 PPD  A/P: patient not ready to quit- wants to focus on upping exercise to have this as a stress reliever first.

## 2014-12-06 LAB — HIV ANTIBODY (ROUTINE TESTING W REFLEX): HIV: NONREACTIVE

## 2014-12-26 ENCOUNTER — Encounter: Payer: Self-pay | Admitting: Podiatry

## 2014-12-26 ENCOUNTER — Ambulatory Visit (INDEPENDENT_AMBULATORY_CARE_PROVIDER_SITE_OTHER): Payer: BLUE CROSS/BLUE SHIELD | Admitting: Podiatry

## 2014-12-26 VITALS — BP 140/83 | HR 82 | Ht 68.0 in | Wt 184.0 lb

## 2014-12-26 DIAGNOSIS — L600 Ingrowing nail: Secondary | ICD-10-CM

## 2014-12-26 DIAGNOSIS — B351 Tinea unguium: Secondary | ICD-10-CM | POA: Diagnosis not present

## 2014-12-26 DIAGNOSIS — M79673 Pain in unspecified foot: Secondary | ICD-10-CM

## 2014-12-26 DIAGNOSIS — M79606 Pain in leg, unspecified: Secondary | ICD-10-CM | POA: Insufficient documentation

## 2014-12-26 MED ORDER — TERBINAFINE HCL 250 MG PO TABS: 250.0000 mg | ORAL_TABLET | Freq: Every day | ORAL | Status: DC

## 2014-12-26 NOTE — Patient Instructions (Addendum)
Seen for dystrophic nails. All nails debrided. Lamisil 250 mg prescribed. Take one a day and return in one month for blood work.

## 2014-12-26 NOTE — Progress Notes (Signed)
SUBJECTIVE: 44 y.o. year old male presents for diabetic foot care. He is having problem with severe fungal infection on both feet right more than the left.  All nails are thick and ingrown with pain. Stated that he was diagnosed with diabetes since 2007. His HgA1c is 6.9 and doing better than what it used to be. 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   REVIEW OF SYSTEMS: A comprehensive review of systems was negative except for diabetes.   OBJECTIVE: DERMATOLOGIC EXAMINATION: Nails: Thick dystrophic nails x 10 R>L.  Ingrown nails bilateral. VASCULAR EXAMINATION OF LOWER LIMBS: Pedal pulses: All pedal pulses are palpable with normal pulsation.  NEUROLOGIC EXAMINATION OF THE LOWER LIMBS: Achilles DTR is present and within normal. Monofilament (Semmes-Weinstein 10-gm) sensory testing positive 6 out of 6, bilateral. Vibratory sensations(128Hz  turning fork) intact at medial and lateral forefoot bilateral.  Sharp and Dull discriminatory sensations at the plantar ball of hallux is intact bilateral.  MUSCULOSKELETAL EXAMINATION: Noted of tight Achilles tendon bilateral with forefoot varus bilateral.   ASSESSMENT: Onychomycosis x 10. Ingrown nails bilateral. Pain in lower limbs.  PLAN: Reviewed clinical findings and available treatment options. All nails debrided. Rx. Lamisil 250mg  q d x 3 month. Will do blood work next month.

## 2015-01-06 ENCOUNTER — Other Ambulatory Visit: Payer: Self-pay | Admitting: Internal Medicine

## 2015-01-09 LAB — HM DIABETES EYE EXAM

## 2015-01-23 ENCOUNTER — Encounter: Payer: Self-pay | Admitting: Podiatry

## 2015-01-23 ENCOUNTER — Ambulatory Visit: Payer: BLUE CROSS/BLUE SHIELD | Admitting: Podiatry

## 2015-01-23 ENCOUNTER — Ambulatory Visit (INDEPENDENT_AMBULATORY_CARE_PROVIDER_SITE_OTHER): Payer: BLUE CROSS/BLUE SHIELD | Admitting: Podiatry

## 2015-01-23 VITALS — BP 150/93 | HR 96

## 2015-01-23 DIAGNOSIS — B351 Tinea unguium: Secondary | ICD-10-CM | POA: Diagnosis not present

## 2015-01-23 DIAGNOSIS — M79606 Pain in leg, unspecified: Secondary | ICD-10-CM

## 2015-01-23 NOTE — Patient Instructions (Signed)
Been on Lamisil x 1 month. Blood drawn for liver enzyme level test.

## 2015-01-23 NOTE — Progress Notes (Signed)
Taking Lamisil. Came in for blood work.  Taking medication as scheduled. Had minor GI problem and not it is ok.  Blood drawn for Hepatic Enzyme level.

## 2015-01-30 ENCOUNTER — Other Ambulatory Visit: Payer: Self-pay | Admitting: *Deleted

## 2015-01-30 ENCOUNTER — Encounter: Payer: Self-pay | Admitting: Internal Medicine

## 2015-01-30 ENCOUNTER — Ambulatory Visit (INDEPENDENT_AMBULATORY_CARE_PROVIDER_SITE_OTHER): Payer: BLUE CROSS/BLUE SHIELD | Admitting: Internal Medicine

## 2015-01-30 VITALS — BP 128/86 | HR 110 | Temp 98.5°F | Resp 16 | Ht 68.0 in | Wt 183.0 lb

## 2015-01-30 DIAGNOSIS — E1165 Type 2 diabetes mellitus with hyperglycemia: Principal | ICD-10-CM

## 2015-01-30 DIAGNOSIS — IMO0001 Reserved for inherently not codable concepts without codable children: Secondary | ICD-10-CM

## 2015-01-30 LAB — HEMOGLOBIN A1C: Hgb A1c MFr Bld: 6.6 % — ABNORMAL HIGH (ref 4.6–6.5)

## 2015-01-30 MED ORDER — GLUCOSE BLOOD VI STRP: ORAL_STRIP | Status: DC

## 2015-01-30 NOTE — Patient Instructions (Signed)
Please continue: - Metformin 1000 mg 2x a day, with meals. - Amaryl 4 mg daily in am. - Invokana 100 mg daily in am.  Please come back for a follow-up appointment in 3-4 months with your sugar log.  Please stop at the lab.

## 2015-01-30 NOTE — Progress Notes (Signed)
Patient ID: Gabriel Hamilton, male   DOB: Mar 01, 1971, 44 y.o.   MRN: [id number]  HPI: Gabriel Hamilton is a 44 y.o.-year-old male, DM2,  dx 2007-8, non-insulin-dependent, uncontrolled, without complications. Last visit 5 mo ago.  Last hemoglobin A1c was: Lab Results  Component Value Date   HGBA1C 7.1* 09/05/2014   HGBA1C 8.1* 04/25/2014   HGBA1C 9.2* 12/13/2013   At last visit we switched to: - Metformin 1000 mg 2x a day, with meals. - Amaryl 4 mg daily in am. - Invokana 100 mg daily in am. He also tried [place] >> worked well. He also tried Onglyza in the past.   Pt checks his sugars 2x a day and they are still higher in am (per meter download) - great later in the day: - am: 160-200 (220-260 off Kombiglyze) >> 91-160 >> 121-170 >> 99-164 (higher when eats late) - 2h after b'fast: n/c >> 101- 154 >> ~130 >> 115 - before lunch: n/c >> 82-148 >> 120-130 >> n/c - 2h after lunch: n/c >> 95-155 >> 100-115 >. n/c - before dinner: 78-115 (180s off Kombiglyze) >> 80-118 >> 91-100 >> 68-128 - 2h after dinner: n/c >> 129-171 >> n/c >> 130-168 - bedtime: n/c >> 115-174, 207, 211 >> 130s >> see above - nighttime: n/c No lows. Lowest sugar was 82 >> 87 >> 68x1; he has hypoglycemia awareness at 70.  Highest sugar was 280 >> 211 >> 188 >> 171  Pt's meals are: - Breakfast: granola bar +/- fruit; may skip it - Lunch: salad, may skip - Dinner: meat (chicken, sausage, steak), veggies, starch (potatoes, rice); stews - Snack: walnuts, trailmix Drinks water, ginger ale (regular).   He lost 30 lbs in the last years. Started going to the gym.   - no CKD, last BUN/creatinine:  Lab Results  Component Value Date   BUN 9 12/05/2014   CREATININE 0.93 12/05/2014  On Avapro. - last set of lipids: Lab Results  Component Value Date   CHOL 150 12/05/2014   HDL 49.20 12/05/2014   LDLCALC 74 12/05/2014   LDLDIRECT 88.4 07/15/2011   TRIG 134.0 12/05/2014   CHOLHDL 3 12/05/2014   - last eye exam was  in 11/29/2013 - Dr Gershon Crane. No DR.  - no numbness and tingling in his feet.  I reviewed pt's medications, allergies, PMH, social hx, family hx, and changes were documented in the history of present illness. Otherwise, unchanged from my initial visit note.  ROS: Constitutional: no weight gain/loss, + fatigue, no subjective hyperthermia Eyes: + blurry vision, no xerophthalmia ENT: no sore throat, no nodules palpated in throat, no dysphagia/odynophagia, no hoarseness Cardiovascular: no CP /no SOB/palpitations/+ leg swelling Respiratory: no cough/SOB Gastrointestinal: no N/V/D/C,+ heartburn Musculoskeletal: + muscle/+ joint aches Skin: no rashes Neurological: no tremors/numbness/tingling/dizziness, + HA + low libido  PE: BP 128/86 mmHg  Pulse 110  Temp(Src) 98.5 F (36.9 C) (Oral)  Resp 16  Ht 5\' 8"  (1.727 m)  Wt 183 lb (83.008 kg)  BMI 27.83 kg/m2  SpO2 95% Body mass index is 27.83 kg/(m^2).  Wt Readings from Last 3 Encounters:  01/30/15 183 lb (83.008 kg)  12/26/14 184 lb (83.462 kg)  12/05/14 186 lb (84.369 kg)   Constitutional: overweight, in NAD Eyes: PERRLA, EOMI, no exophthalmos ENT: moist mucous membranes, no thyromegaly, no cervical lymphadenopathy Cardiovascular: tachycardia, RR, No MRG Respiratory: CTA B Gastrointestinal: abdomen soft, NT, ND, BS+ Musculoskeletal: no deformities, strength intact in all 4 Skin: moist, warm, no rashes Neurological: no  tremor with outstretched hands, DTR normal in all 4  ASSESSMENT: 1. DM2, non-insulin-dependent, uncontrolled, without complications  PLAN:  1. Patient with long-standing, uncontrolled diabetes, on oral antidiabetic regimen, with great improvement after adding Invokana and changing diet. He still has higher sugars in am but only if has a larger meal or eats a late dinner. - I suggested to continue same regimen:  Patient Instructions  Please continue: - Metformin 1000 mg 2x a day, with meals. - Amaryl 4 mg daily in  am. - Invokana 100 mg daily in am.  Please come back for a follow-up appointment in 3-4 months with your sugar log.  Please stop at the lab.  - continue checking sugars at different times of the day - check 2 times a day, rotating checks - advised for yearly eye exams >> he needs one - will check Hba1c today - Return to clinic in 3-4 mo with sugar log   Office Visit on 01/30/2015  Component Date Value Ref Range Status  . Hgb A1c MFr Bld 01/30/2015 6.6* 4.6 - 6.5 % Final   Glycemic Control Guidelines for People with Diabetes:Non Diabetic:  <6%Goal of Therapy: <7%Additional Action Suggested:  >8%    Excellent HbA1c!

## 2015-01-31 ENCOUNTER — Encounter: Payer: Self-pay | Admitting: *Deleted

## 2015-02-08 ENCOUNTER — Other Ambulatory Visit: Payer: Self-pay | Admitting: Internal Medicine

## 2015-02-20 ENCOUNTER — Encounter: Payer: Self-pay | Admitting: Podiatry

## 2015-02-20 ENCOUNTER — Ambulatory Visit (INDEPENDENT_AMBULATORY_CARE_PROVIDER_SITE_OTHER): Payer: BLUE CROSS/BLUE SHIELD | Admitting: Podiatry

## 2015-02-20 ENCOUNTER — Ambulatory Visit: Payer: BLUE CROSS/BLUE SHIELD | Admitting: Podiatry

## 2015-02-20 VITALS — BP 139/85 | HR 95

## 2015-02-20 DIAGNOSIS — B351 Tinea unguium: Secondary | ICD-10-CM | POA: Diagnosis not present

## 2015-02-20 DIAGNOSIS — M79606 Pain in leg, unspecified: Secondary | ICD-10-CM | POA: Diagnosis not present

## 2015-02-20 NOTE — Progress Notes (Signed)
Subjective: 44 y.o. year old male presents for follow up on Lamisil treatment for deformed fungal nails. Last blood work was normal result.  Left great toe is painful from ingrown. No problem taking Lamisil.  OBJECTIVE: DERMATOLOGIC EXAMINATION: Nails: Thick dystrophic nails x 10 R>L.  Ingrown nails bilateral. VASCULAR EXAMINATION OF LOWER LIMBS: Pedal pulses: All pedal pulses are palpable with normal pulsation.  NEUROLOGIC EXAMINATION OF THE LOWER LIMBS: All epicritic and tactile sensations grossly intact.  MUSCULOSKELETAL EXAMINATION: Noted of tight Achilles tendon bilateral with forefoot varus bilateral.   ASSESSMENT: Onychomycosis x 10. Ingrown nails bilateral. Pain in lower limbs.  PLAN: Reviewed clinical findings and available treatment options. All nails debrided. Continue with Lamisil for another week.

## 2015-02-20 NOTE — Patient Instructions (Signed)
Seen for follow up on Lamisil treatment for fungal nails. All nails debrided. Continue with medication. Return in 2 months or as needed.

## 2015-03-16 ENCOUNTER — Other Ambulatory Visit: Payer: Self-pay | Admitting: Internal Medicine

## 2015-04-10 ENCOUNTER — Ambulatory Visit: Payer: BLUE CROSS/BLUE SHIELD | Admitting: Podiatry

## 2015-04-15 ENCOUNTER — Other Ambulatory Visit: Payer: Self-pay | Admitting: Internal Medicine

## 2015-04-24 ENCOUNTER — Ambulatory Visit: Payer: BLUE CROSS/BLUE SHIELD | Admitting: Podiatry

## 2015-05-08 ENCOUNTER — Other Ambulatory Visit: Payer: Self-pay

## 2015-05-08 ENCOUNTER — Ambulatory Visit (INDEPENDENT_AMBULATORY_CARE_PROVIDER_SITE_OTHER): Payer: BLUE CROSS/BLUE SHIELD | Admitting: Podiatry

## 2015-05-08 ENCOUNTER — Encounter: Payer: Self-pay | Admitting: Podiatry

## 2015-05-08 VITALS — BP 155/88 | HR 90

## 2015-05-08 DIAGNOSIS — M216X9 Other acquired deformities of unspecified foot: Secondary | ICD-10-CM | POA: Diagnosis not present

## 2015-05-08 DIAGNOSIS — B351 Tinea unguium: Secondary | ICD-10-CM

## 2015-05-08 DIAGNOSIS — M79606 Pain in leg, unspecified: Secondary | ICD-10-CM

## 2015-05-08 DIAGNOSIS — M21969 Unspecified acquired deformity of unspecified lower leg: Secondary | ICD-10-CM

## 2015-05-08 DIAGNOSIS — M216X2 Other acquired deformities of left foot: Secondary | ICD-10-CM

## 2015-05-08 DIAGNOSIS — M79673 Pain in unspecified foot: Secondary | ICD-10-CM

## 2015-05-08 DIAGNOSIS — M216X1 Other acquired deformities of right foot: Secondary | ICD-10-CM

## 2015-05-08 NOTE — Patient Instructions (Signed)
Seen for pain in left foot. Noted of short and elevated first metatarsal bone. May benefit from custom orthotics.

## 2015-05-08 NOTE — Progress Notes (Signed)
Subjective: 44 y.o. year old male presents complaining of pain on top, medial big toe, ankle, and arch pain on left for the past 2 weeks. Nagging pain level 6-7 from 1-10 scale, 10 being worse. Has had this type pain last year, but for the last 2-3 weeks reoccurring and consistent, L>R. Also has left knee, hip problem worse with increased ambulation. Aleve helps some. Has had knee problem for the past 6 months.  Been treated for fungal nail with Lamisil. Stated that he sees some improvement.   OBJECTIVE: DERMATOLOGIC EXAMINATION: Nails: Thick dystrophic nails x 10 R>L.  VASCULAR EXAMINATION OF LOWER LIMBS: Pedal pulses: All pedal pulses are palpable with normal pulsation.  NEUROLOGIC EXAMINATION OF THE LOWER LIMBS: All epicritic and tactile sensations grossly intact.  MUSCULOSKELETAL EXAMINATION: Noted of tight Achilles tendon bilateral with forefoot varus bilateral.  Positive of elevated first ray with short hallux.   Radiographic examination reveal short first metatarsal -5, with increased lateral deviation of CC angle in AP view. Lateral view show elevated first metatarsal bone bilateral. STJ CYMA line is close to normal.   ASSESSMENT: Onychomycosis x 10. Ankle equinus bilateral. Brachy metatarsal first ray bilateral.  Pain in lower limbs.  PLAN: Reviewed clinical findings and available treatment options. Reviewed the benefit of custom orthotics. All nails debrided.

## 2015-05-22 ENCOUNTER — Encounter: Payer: Self-pay | Admitting: Podiatry

## 2015-05-22 ENCOUNTER — Ambulatory Visit (INDEPENDENT_AMBULATORY_CARE_PROVIDER_SITE_OTHER): Payer: BLUE CROSS/BLUE SHIELD | Admitting: Podiatry

## 2015-05-22 DIAGNOSIS — M216X9 Other acquired deformities of unspecified foot: Secondary | ICD-10-CM

## 2015-05-22 DIAGNOSIS — M216X2 Other acquired deformities of left foot: Secondary | ICD-10-CM

## 2015-05-22 DIAGNOSIS — M21969 Unspecified acquired deformity of unspecified lower leg: Secondary | ICD-10-CM | POA: Diagnosis not present

## 2015-05-22 DIAGNOSIS — M216X1 Other acquired deformities of right foot: Secondary | ICD-10-CM

## 2015-05-22 NOTE — Patient Instructions (Signed)
Casted for orthotics.

## 2015-05-22 NOTE — Progress Notes (Signed)
Subjective: 44 y.o. year old male presents requesting custom orthotics for his painful feet. The findings and available treatment options discussed during his last visit.   The pain is on top, medial big toe, ankle, and arch pain on left x 1 mo.  Nagging pain level 6-7 from 1-10 scale, 10 being worse. Has had this type pain last year, but for the last 2-3 weeks reoccurring and consistent, L>R. Also has left knee, hip problem worse with increased ambulation. Aleve helps some. Has had knee problem for the past 6 months.   OBJECTIVE: DERMATOLOGIC EXAMINATION: Nails: Thick dystrophic nails x 10 R>L.  VASCULAR EXAMINATION OF LOWER LIMBS: Pedal pulses: All pedal pulses are palpable with normal pulsation.  NEUROLOGIC EXAMINATION OF THE LOWER LIMBS: All epicritic and tactile sensations grossly intact.  MUSCULOSKELETAL EXAMINATION: Noted of tight Achilles tendon bilateral with forefoot varus bilateral.  Positive of elevated first ray with short hallux.   Last X-ray report indicated short first metatarsal -5, with increased lateral deviation of CC angle in AP view. Lateral view show elevated first metatarsal bone bilateral. STJ CYMA line is close to normal.   ASSESSMENT: Ankle equinus bilateral. Brachy metatarsal first ray bilateral.  Pain in lower limbs.  PLAN: Both feet casted for orthotics. Will accommodate for Brachy metatarsal 1st bilateral.

## 2015-05-29 ENCOUNTER — Encounter: Payer: Self-pay | Admitting: Internal Medicine

## 2015-05-29 ENCOUNTER — Ambulatory Visit (INDEPENDENT_AMBULATORY_CARE_PROVIDER_SITE_OTHER): Payer: 59 | Admitting: Internal Medicine

## 2015-05-29 ENCOUNTER — Other Ambulatory Visit: Payer: Self-pay | Admitting: *Deleted

## 2015-05-29 VITALS — BP 122/76 | HR 113 | Temp 98.6°F | Resp 12 | Wt 186.8 lb

## 2015-05-29 DIAGNOSIS — E1165 Type 2 diabetes mellitus with hyperglycemia: Secondary | ICD-10-CM | POA: Diagnosis not present

## 2015-05-29 DIAGNOSIS — IMO0001 Reserved for inherently not codable concepts without codable children: Secondary | ICD-10-CM

## 2015-05-29 LAB — POCT GLYCOSYLATED HEMOGLOBIN (HGB A1C): HEMOGLOBIN A1C: 7.4

## 2015-05-29 NOTE — Progress Notes (Signed)
Patient ID: Gabriel Hamilton, male   DOB: 1971/01/03, 44 y.o.   MRN: [id number]  HPI: Gabriel Hamilton is a 44 y.o.-year-old male, DM2,  dx 2007-8, non-insulin-dependent, uncontrolled, without complications. Last visit 4 mo ago.  He was out of Invokana when out of town in 10-04/2015. Sugars higher then.  Last hemoglobin A1c was: Lab Results  Component Value Date   HGBA1C 6.6* 01/30/2015   HGBA1C 7.1* 09/05/2014   HGBA1C 8.1* 04/25/2014   At last visit we switched to: - Metformin 1000 mg 2x a day, with meals. - Amaryl 4 mg daily in am. - Invokana 100 mg daily in am. He also tried [place] >> worked well. He also tried Onglyza in the past.   Pt checks his sugars 2x a day and they are still higher in am - great later in the day 60 day [address] 140: - am: 160-200 (220-260 off Kombiglyze) >> 91-160 >> 121-170 >> 99-164 (higher when eats late) >> 115-137,180x1 - 2h after b'fast: n/c >> 101- 154 >> ~130 >> 115 >>  - before lunch: n/c >> 82-148 >> 120-130 >> n/c - 2h after lunch: n/c >> 95-155 >> 100-115 >> n/c >> 130s - before dinner: 78-115 (180s off Kombiglyze) >> 80-118 >> 91-100 >> 68-128 >> 100-115 - 2h after dinner: n/c >> 129-171 >> n/c >> 130-168 >> 140s - bedtime: n/c >> 115-174, 207, 211 >> 130s >> see above - nighttime: n/c No lows. Lowest sugar was 82 >> 87 >> 68x1 >> 100; he has hypoglycemia awareness at 70.  Highest sugar was 280 >> 211 >> 188 >> 171 >> 180x1  Pt's meals are: - Breakfast: granola bar +/- fruit; may skip it - Lunch: salad, may skip - Dinner: meat (chicken, sausage, steak), veggies, starch (potatoes, rice); stews - Snack: walnuts, trailmix Drinks water, ginger ale (regular).   He lost 30 lbs in the last years. He is going to the gym.   - no CKD, last BUN/creatinine:  Lab Results  Component Value Date   BUN 9 12/05/2014   CREATININE 0.93 12/05/2014  On Avapro. - last set of lipids: Lab Results  Component Value Date   CHOL 150 12/05/2014   HDL 49.20  12/05/2014   LDLCALC 74 12/05/2014   LDLDIRECT 88.4 07/15/2011   TRIG 134.0 12/05/2014   CHOLHDL 3 12/05/2014   - last eye exam was 01/09/2015 - Dr Gershon Crane. No DR.  - no numbness and tingling in his feet. Dr Caffie Pinto - podiatry.  I reviewed pt's medications, allergies, PMH, social hx, family hx, and changes were documented in the history of present illness. Otherwise, unchanged from my initial visit note. Stopped terbinafine.  ROS: Constitutional: no weight gain/loss, + fatigue, no subjective hyperthermia Eyes: no blurry vision, no xerophthalmia ENT: no sore throat, no nodules palpated in throat, no dysphagia/odynophagia, no hoarseness Cardiovascular: no CP /no SOB/palpitations/+ leg swelling Respiratory: no cough/SOB Gastrointestinal: no N/V/D/C,+ heartburn Musculoskeletal: no muscle/+ joint aches Skin: no rashes Neurological: no tremors/numbness/tingling/dizziness  PE: BP 122/76 mmHg  Pulse 113  Temp(Src) 98.6 F (37 C) (Oral)  Resp 12  Wt 186 lb 12.8 oz (84.732 kg)  SpO2 98% Body mass index is 28.41 kg/(m^2).  Wt Readings from Last 3 Encounters:  01/30/15 183 lb (83.008 kg)  12/26/14 184 lb (83.462 kg)  12/05/14 186 lb (84.369 kg)   Constitutional: overweight, in NAD Eyes: PERRLA, EOMI, no exophthalmos ENT: moist mucous membranes, no thyromegaly, no cervical lymphadenopathy Cardiovascular: tachycardia, RR, No MRG Respiratory:  CTA B Gastrointestinal: abdomen soft, NT, ND, BS+ Musculoskeletal: no deformities, strength intact in all 4 Skin: moist, warm, no rashes Neurological: no tremor with outstretched hands, DTR normal in all 4  ASSESSMENT: 1. DM2, non-insulin-dependent, uncontrolled, without complications  PLAN:  1. Patient with long-standing, uncontrolled diabetes, on oral antidiabetic regimen, with great improvement after adding Invokana and changing diet. His last hemoglobin A1c was excellent, at 6.6%. We did not change his diabetes regimen at last visit. He was  out of meds 1-2 mo ago >> sugars higher, but now back to target, except higher in am, but not much higher.  - I suggested to: Patient Instructions  Please continue: - Metformin 1000 mg 2x a day, with meals. - Amaryl 4 mg daily in am. - Invokana 100 mg daily in am.  Please come back for a follow-up appointment in 3 months with your sugar log.  - continue checking sugars at different times of the day - check 2 times a day, rotating checks - advised for yearly eye exams >> he is UTD - will check Hba1c today >> 7.4% (higher), but this is not evident from his CBGs... - Return to clinic in 3 mo with sugar log

## 2015-05-29 NOTE — Patient Instructions (Signed)
Please continue: - Metformin 1000 mg 2x a day, with meals. - Amaryl 4 mg daily in am. - Invokana 100 mg daily in am.  Please come back for a follow-up appointment in 3 months with your sugar log.

## 2015-06-12 ENCOUNTER — Encounter: Payer: Self-pay | Admitting: Family Medicine

## 2015-06-12 ENCOUNTER — Ambulatory Visit (INDEPENDENT_AMBULATORY_CARE_PROVIDER_SITE_OTHER): Payer: 59 | Admitting: Family Medicine

## 2015-06-12 VITALS — BP 118/76 | Temp 98.3°F | Wt 185.0 lb

## 2015-06-12 DIAGNOSIS — R5383 Other fatigue: Secondary | ICD-10-CM | POA: Diagnosis not present

## 2015-06-12 DIAGNOSIS — M25512 Pain in left shoulder: Secondary | ICD-10-CM | POA: Diagnosis not present

## 2015-06-12 DIAGNOSIS — M25552 Pain in left hip: Secondary | ICD-10-CM | POA: Diagnosis not present
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
## 2015-06-12 NOTE — Patient Instructions (Signed)
Schedule a lab visit at the front desk within next few weeks to investigate causes of fatigue. Return for future fasting labs. Nothing but water after midnight please.   Strongly advise you to quit smoking  Wonder if left sided issues could be alignment related considering foot issue. Reasonable to take a week of aleve twice a day when issues flare and ice problem areas. Could also refer to orthopedics if persists- can call in within a month or two and I will schedule this if you want  If you have new or worsening symptoms and specifically if left shoulder pain is exertional or associated with shortness of breath, nausea, or sweating please return to care

## 2015-06-12 NOTE — Progress Notes (Signed)
Pre visit review using our clinic review tool, if applicable. No additional management support is needed unless otherwise documented below in the visit note. 

## 2015-06-14 ENCOUNTER — Ambulatory Visit: Payer: BLUE CROSS/BLUE SHIELD | Admitting: Family Medicine

## 2015-06-22 ENCOUNTER — Other Ambulatory Visit: Payer: Self-pay | Admitting: Internal Medicine

## 2015-06-26 ENCOUNTER — Other Ambulatory Visit (INDEPENDENT_AMBULATORY_CARE_PROVIDER_SITE_OTHER): Payer: 59

## 2015-06-26 DIAGNOSIS — R5383 Other fatigue: Secondary | ICD-10-CM | POA: Diagnosis not present

## 2015-06-26 LAB — COMPREHENSIVE METABOLIC PANEL
ALK PHOS: 74 U/L (ref 39–117)
ALT: 14 U/L (ref 0–53)
AST: 14 U/L (ref 0–37)
Albumin: 4.6 g/dL (ref 3.5–5.2)
BUN: 12 mg/dL (ref 6–23)
CO2: 31 meq/L (ref 19–32)
Calcium: 10.6 mg/dL — ABNORMAL HIGH (ref 8.4–10.5)
Chloride: 97 mEq/L (ref 96–112)
Creatinine, Ser: 0.97 mg/dL (ref 0.40–1.50)
GFR: 107.27 mL/min (ref >60.00)
GLUCOSE: 187 mg/dL — AB (ref 70–99)
POTASSIUM: 4.5 meq/L (ref 3.5–5.1)
SODIUM: 141 meq/L (ref 135–145)
TOTAL PROTEIN: 7.1 g/dL (ref 6.0–8.3)
Total Bilirubin: 0.6 mg/dL (ref 0.2–1.2)

## 2015-06-26 LAB — CBC WITH DIFFERENTIAL/PLATELET
BASOS PCT: 0.3 % (ref 0.0–3.0)
Basophils Absolute: 0 10*3/uL (ref 0.0–0.1)
EOS PCT: 4.6 % (ref 0.0–5.0)
Eosinophils Absolute: 0.3 10*3/uL (ref 0.0–0.7)
HCT: 55 % — ABNORMAL HIGH (ref 39.0–52.0)
Hemoglobin: 18.3 g/dL (ref 13.0–17.0)
LYMPHS ABS: 2 10*3/uL (ref 0.7–4.0)
Lymphocytes Relative: 31.1 % (ref 12.0–46.0)
MCHC: 33.3 g/dL (ref 30.0–36.0)
MCV: 92.8 fl (ref 78.0–100.0)
MONO ABS: 0.5 10*3/uL (ref 0.1–1.0)
MONOS PCT: 7.1 % (ref 3.0–12.0)
NEUTROS PCT: 56.9 % (ref 43.0–77.0)
Neutro Abs: 3.7 10*3/uL (ref 1.4–7.7)
Platelets: 246 10*3/uL (ref 150.0–400.0)
RBC: 5.93 Mil/uL — AB (ref 4.22–5.81)
RDW: 14.4 % (ref 11.5–15.5)
WBC: 6.5 10*3/uL (ref 4.0–10.5)

## 2015-06-26 LAB — TSH: TSH: 0.8 u[IU]/mL (ref 0.35–4.50)

## 2015-06-26 LAB — PSA: PSA: 0.66 ng/mL (ref 0.10–4.00)

## 2015-06-27 LAB — TESTOSTERONE, FREE, TOTAL, SHBG
SEX HORMONE BINDING: 13 nmol/L (ref 10–50)
TESTOSTERONE FREE: 66.1 pg/mL (ref 47.0–244.0)
TESTOSTERONE: 225 ng/dL — AB (ref 300–890)
Testosterone-% Free: 2.9 % (ref 1.6–2.9)

## 2015-07-03 ENCOUNTER — Ambulatory Visit: Payer: 59 | Admitting: Family Medicine

## 2015-07-10 ENCOUNTER — Ambulatory Visit (INDEPENDENT_AMBULATORY_CARE_PROVIDER_SITE_OTHER): Payer: 59 | Admitting: Family Medicine

## 2015-07-10 ENCOUNTER — Encounter: Payer: Self-pay | Admitting: Family Medicine

## 2015-07-10 VITALS — BP 112/74 | HR 104 | Temp 98.2°F | Wt 190.0 lb

## 2015-07-10 DIAGNOSIS — D751 Secondary polycythemia: Secondary | ICD-10-CM | POA: Diagnosis not present

## 2015-07-10 NOTE — Assessment & Plan Note (Signed)
S: Reactive secondary to smoking likely. Followed by hematology last 2014. Hemoglobin has increased recently now over 18 with last blood draw 1 unit about 2-3 years ago. We reviewed lab notes and patient is asymptomatic including no chest pain, shortness of breath, abdominal pain, headache, blurry vision- and we reviewed ROS noted in those lab notes. Pre exercise sats were 96% and post 97%. No signs or symptoms of sleep apnea.  A/P: we will do therapeutic phlebotomy with 1 unit today. Return for CBC in month and consider repeat at that time. Biggest thing is quitting smoking- strongly encouraged him to stop. 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  My preference was to have him return to hematology but with $2500 deductible this is not possible for patient.  We also reviewed the rest of his labs today.

## 2015-07-10 NOTE — Patient Instructions (Signed)
We discussed referring back to hematology for follow up of polycythemia (high hemoglobin/thick blood) but due to expense we are going to try to manage here for you.   #1 thing to do is quit smoking- please please please do this!!!! 1800quit now is a resource for quitting smoking. They have free nicotine replacement oftentimes but I know you have a nicorette discount as well- might want to use this. If you would like specific instructions once you buy this- let me know  #2- therapeutic phlebotomy with 1 unit of blood off today then return in a month for CBC and we will decide if we need to repeat

## 2015-07-10 NOTE — Progress Notes (Signed)
Garret Reddish, MD  Subjective:  Gabriel Hamilton is a 45 y.o. year old very pleasant male patient who presents for/with See problem oriented charting ROS- no chest pain, shortness of breath, snoring, headache also noted below  Past Medical History-  Patient Active Problem List   Diagnosis Date Noted  . Tobacco abuse 12/22/2011    Priority: High  . Uncontrolled diabetes mellitus type 2 without complications ([place]) 123456    Priority: High  . Hypertriglyceridemia 06/06/2014    Priority: Medium  . Polycythemia 08/26/2011    Priority: Medium  . Essential hypertension 07/10/2009    Priority: Medium  . Tachycardia 02/14/2014    Priority: Low  . Acne     Priority: Low  . HYPOGONADISM 11/21/2009    Priority: Low  . GERD 03/27/2008    Priority: Low  . HEART MURMUR 03/27/2008    Priority: Low  . 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 Equinus deformity of foot, acquired 05/08/2015  . Onychomycosis 12/26/2014  . Pain in lower limb 12/26/2014    Medications- reviewed and updated Current Outpatient Prescriptions  Medication Sig Dispense Refill  . ACCU-CHEK FASTCLIX LANCETS MISC Test 2x a day 100 each 11  . ampicillin (PRINCIPEN) 500 MG capsule TAKE ONE CAPSULE BY MOUTH TWICE DAILY WITH FOOD  2  . glimepiride (AMARYL) 4 MG tablet Take one tablet by mouth every day before breakfast. 30 tablet 11  . glucose blood (ACCU-CHEK SMARTVIEW) test strip Test 2x a day 100 each 11  . INVOKANA 100 MG TABS tablet TAKE ONE TABLET BY MOUTH EVERY MORNING 30 tablet 2  . metFORMIN (GLUCOPHAGE) 1000 MG tablet TAKE ONE TABLET TWICE DAILY WITH MEALS 60 tablet 2  . naproxen sodium (ANAPROX) 220 MG tablet USING FOR JOINT PAIN    . omeprazole (PRILOSEC) 20 MG capsule Take 20 mg by mouth daily.     No current facility-administered medications for this visit.    Objective: BP 112/74 mmHg  Pulse 104  Temp(Src) 98.2 F (36.8 C)  Wt 190 lb (86.183 kg) Gen:  NAD, resting comfortably CV: RRR no murmurs rubs or gallops Lungs: CTAB no crackles, wheeze, rhonchi Abdomen: soft/nontender/nondistended/normal bowel sounds. No rebound or guarding.  Ext: no edema Skin: warm, dry Neuro: grossly normal, moves all extremities  Assessment/Plan:  Polycythemia S: Reactive secondary to smoking likely. Followed by hematology last 2014. Hemoglobin has increased recently now over 18 with last blood draw 1 unit about 2-3 years ago. We reviewed lab notes and patient is asymptomatic including no chest pain, shortness of breath, abdominal pain, headache, blurry vision- and we reviewed ROS noted in those lab notes. Pre exercise sats were 96% and post 97%. No signs or symptoms of sleep apnea.  A/P: we will do therapeutic phlebotomy with 1 unit today. Return for CBC in month and consider repeat at that time. Biggest thing is quitting smoking- strongly encouraged him to stop. 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  My preference was to have him return to hematology but with $2500 deductible this is not possible for patient.  We also reviewed the rest of his labs today.   Determine follow up for this based on repeat CBC  Orders Placed This Encounter  Procedures  . Phlebotomy therapeutic    Standing Status: Future     Number of Occurrences:      Standing Expiration Date: 07/09/2016  .  CBC    Standing Status: Future     Number of Occurrences:      Standing Expiration Date: 07/09/2016   >50% of 25 minute office visit was spent on counseling (reason I would prefer hematology follow up, risks of polycythemia, counseling on warning signs for sooner return, ultimately deciding together for phlebotomy here) and coordination of care

## 2015-07-24 ENCOUNTER — Encounter: Payer: Self-pay | Admitting: Podiatry

## 2015-07-24 ENCOUNTER — Ambulatory Visit (INDEPENDENT_AMBULATORY_CARE_PROVIDER_SITE_OTHER): Payer: 59 | Admitting: Podiatry

## 2015-07-24 VITALS — BP 151/85 | HR 97

## 2015-07-24 DIAGNOSIS — M79606 Pain in leg, unspecified: Secondary | ICD-10-CM

## 2015-07-24 DIAGNOSIS — B351 Tinea unguium: Secondary | ICD-10-CM

## 2015-07-24 DIAGNOSIS — L600 Ingrowing nail: Secondary | ICD-10-CM

## 2015-07-24 NOTE — Progress Notes (Signed)
Subjective: 45 y.o. year old male presents follow up on orthotics. Over all hip and feet are doing better with orthotics.  He was not able to keep up with anti fungal treatment on his toe nails and they are very thick, ingrown and painful.  They tend to bleed when he tries to dig out ingrown nails.   OBJECTIVE: DERMATOLOGIC EXAMINATION: Nails: Thick dystrophic nails x 10 R>L.  Ingrown nails both great toe both borders, painful. VASCULAR EXAMINATION OF LOWER LIMBS: Pedal pulses: All pedal pulses are palpable with normal pulsation.  NEUROLOGIC EXAMINATION OF THE LOWER LIMBS: All epicritic and tactile sensations grossly intact.  MUSCULOSKELETAL EXAMINATION: Noted of tight Achilles tendon bilateral with forefoot varus bilateral.  Positive of elevated first ray with short hallux.   ASSESSMENT: Improved foot pain with Orthotics. Onychomycosis x 10. Ankle equinus bilateral. Brachy metatarsal first ray bilateral.   PLAN: Reviewed clinical findings and available treatment options. All nails debrided. Patient wants to do the ingrown nails surgery on his next visit. Return in 3 months or as needed.

## 2015-07-24 NOTE — Patient Instructions (Signed)
Orthotic follow up. Feet are doing better with orthotics. Noted of thick dystrophic nails. All debrided. Return in 3 months for RFC and nail surgery.

## 2015-08-22 ENCOUNTER — Other Ambulatory Visit: Payer: Self-pay | Admitting: Family Medicine

## 2015-08-22 NOTE — Progress Notes (Signed)
Lm on pt vm.

## 2015-08-28 ENCOUNTER — Ambulatory Visit: Payer: BLUE CROSS/BLUE SHIELD | Admitting: Internal Medicine

## 2015-09-04 ENCOUNTER — Ambulatory Visit: Payer: 59 | Admitting: Internal Medicine

## 2015-09-25 ENCOUNTER — Ambulatory Visit (INDEPENDENT_AMBULATORY_CARE_PROVIDER_SITE_OTHER): Payer: 59 | Admitting: Family Medicine

## 2015-09-25 ENCOUNTER — Encounter: Payer: Self-pay | Admitting: Family Medicine

## 2015-09-25 VITALS — BP 136/88 | HR 92 | Temp 97.9°F | Wt 182.0 lb

## 2015-09-25 DIAGNOSIS — I10 Essential (primary) hypertension: Secondary | ICD-10-CM

## 2015-09-25 DIAGNOSIS — D751 Secondary polycythemia: Secondary | ICD-10-CM | POA: Diagnosis not present

## 2015-09-25 LAB — CBC
HCT: 53.9 % — ABNORMAL HIGH (ref 39.0–52.0)
Hemoglobin: 17.9 g/dL — ABNORMAL HIGH (ref 13.0–17.0)
MCHC: 33.1 g/dL (ref 30.0–36.0)
MCV: 89.8 fl (ref 78.0–100.0)
PLATELETS: 270 10*3/uL (ref 150.0–400.0)
RBC: 6 Mil/uL — ABNORMAL HIGH (ref 4.22–5.81)
RDW: 14.8 % (ref 11.5–15.5)
WBC: 6 10*3/uL (ref 4.0–10.5)

## 2015-09-25 NOTE — Progress Notes (Signed)
Gabriel Reddish, MD  Subjective:  Gabriel Hamilton is a 45 y.o. year old very pleasant male patient who presents for/with See problem oriented charting ROS- see ros below  Past Medical History-  Patient Active Problem List   Diagnosis Date Noted  . Tobacco abuse 12/22/2011    Priority: High  . Uncontrolled diabetes mellitus type 2 without complications ([place]) 123456    Priority: High  . Hypertriglyceridemia 06/06/2014    Priority: Medium  . Polycythemia 08/26/2011    Priority: Medium  . Essential hypertension 07/10/2009    Priority: Medium  . Tachycardia 02/14/2014    Priority: Low  . Acne     Priority: Low  . HYPOGONADISM 11/21/2009    Priority: Low  . GERD 03/27/2008    Priority: Low  . HEART MURMUR 03/27/2008    Priority: Low  . Ingrown nail 07/24/2015  . 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 Equinus deformity of foot, acquired 05/08/2015  . Onychomycosis 12/26/2014  . Pain in lower limb 12/26/2014    Medications- reviewed and updated Current Outpatient Prescriptions  Medication Sig Dispense Refill  . ACCU-CHEK FASTCLIX LANCETS MISC Test 2x a day 100 each 11  . ampicillin (PRINCIPEN) 500 MG capsule TAKE ONE CAPSULE BY MOUTH TWICE DAILY WITH FOOD  2  . glimepiride (AMARYL) 4 MG tablet TAKE ONE TABLET BY MOUTH ONE TIME DAILY BEFORE BREAKFAST 30 tablet 11  . glucose blood (ACCU-CHEK SMARTVIEW) test strip Test 2x a day 100 each 11  . INVOKANA 100 MG TABS tablet TAKE ONE TABLET BY MOUTH EVERY MORNING 30 tablet 2  . metFORMIN (GLUCOPHAGE) 1000 MG tablet TAKE ONE TABLET TWICE DAILY WITH MEALS 60 tablet 2  . naproxen sodium (ANAPROX) 220 MG tablet USING FOR JOINT PAIN    . omeprazole (PRILOSEC) 20 MG capsule Take 20 mg by mouth daily.     No current facility-administered medications for this visit.    Objective: BP 136/88 mmHg  Pulse 92  Temp(Src) 97.9 F (36.6 C)  Wt 182 lb (82.555 kg) Gen: NAD, resting  comfortably CV: RRR no murmurs rubs or gallops Lungs: CTAB no crackles, wheeze, rhonchi Abdomen: soft/nontender/nondistended/normal bowel sounds. No rebound or guarding.  Ext: no edema Skin: warm, dry Neuro: grossly normal, moves all extremities  Assessment/Plan:  Polycythemia S:Last blood draw 1 unit 2-3 years ago and hgb had crept to above 18. On 07/10/15 we did another 1 unit. PLan was for 1 month recheck. Presents today at 3 months. We have not yet checked repeat CBCh ROS- no chest pain, shortness of breath, abdominal pain, headache, blurry vision A/P: We will repeat CBC. Has not cut down on his smoking- strongly encouraged as this is primary treatment he needs. Still 1/2 PPD. Wants to quit on his own. Once again, cannot afford due to his deductible to go to hematology.   Essential hypertension S: controlled on repeat though initial diastolic was too high. Irbesartan in past, now only on invokana but likely has indirect benefit. Also exercising 1.5 hours 3x a week- has lost 8 lbssince last visit as result BP Readings from Last 3 Encounters:  09/25/15 136/88  07/24/15 151/85  07/10/15 112/74  A/P:Continue without specific BP therapy and continue lifestyle changes    Return in about 6 months (around 03/26/2016). likely get repeat CBC before follow up if have to do phlebotomy- plan is to treat if above 17.   Return precautions advised.

## 2015-09-25 NOTE — Assessment & Plan Note (Signed)
S: controlled on repeat though initial diastolic was too high. Irbesartan in past, now only on invokana but likely has indirect benefit. Also exercising 1.5 hours 3x a week- has lost 8 lbssince last visit as result BP Readings from Last 3 Encounters:  09/25/15 136/88  07/24/15 151/85  07/10/15 112/74  A/P:Continue without specific BP therapy and continue lifestyle changes

## 2015-09-25 NOTE — Assessment & Plan Note (Signed)
S:Last blood draw 1 unit 2-3 years ago and hgb had crept to above 18. On 07/10/15 we did another 1 unit. PLan was for 1 month recheck. Presents today at 3 months. We have not yet checked repeat CBCh ROS- no chest pain, shortness of breath, abdominal pain, headache, blurry vision A/P: We will repeat CBC. Has not cut down on his smoking- strongly encouraged as this is primary treatment he needs. Still 1/2 PPD. Wants to quit on his own. Once again, cannot afford due to his deductible to go to hematology.

## 2015-09-25 NOTE — Patient Instructions (Signed)
Bloodwork before you leave (release prior CBC for lab)  Based on results if over 17 hemoglobin will have you back for phlebotomy again. Base follow up off of repeat levels.   Biggest thing for your health and specifically for the thick blood issue is to quit smoking. You said again you want to work on this on your own. Let me know if you need further help.

## 2015-09-26 ENCOUNTER — Other Ambulatory Visit: Payer: Self-pay | Admitting: Internal Medicine

## 2015-10-23 ENCOUNTER — Ambulatory Visit: Payer: 59 | Admitting: Podiatry

## 2015-10-29 ENCOUNTER — Other Ambulatory Visit: Payer: Self-pay | Admitting: Family Medicine

## 2015-10-29 ENCOUNTER — Telehealth: Payer: Self-pay | Admitting: Family Medicine

## 2015-10-29 DIAGNOSIS — D751 Secondary polycythemia: Secondary | ICD-10-CM

## 2015-10-29 NOTE — Telephone Encounter (Signed)
Pt was seen 4/5 and instructed to return in a month for phlebotomy. Dr Yong Channel has in his notes, but please advise on what labs to do and I will schedule pt.   Can you put the order in? Thanks!

## 2015-10-29 NOTE — Telephone Encounter (Signed)
Labs ordered.

## 2015-10-29 NOTE — Telephone Encounter (Signed)
I do not see the orders.  Pt has been scheduled Can you help? Thank you!

## 2015-11-06 ENCOUNTER — Encounter: Payer: Self-pay | Admitting: Podiatry

## 2015-11-06 ENCOUNTER — Ambulatory Visit (INDEPENDENT_AMBULATORY_CARE_PROVIDER_SITE_OTHER): Payer: 59 | Admitting: Podiatry

## 2015-11-06 VITALS — BP 147/80 | HR 94

## 2015-11-06 DIAGNOSIS — B351 Tinea unguium: Secondary | ICD-10-CM | POA: Diagnosis not present

## 2015-11-06 DIAGNOSIS — L600 Ingrowing nail: Secondary | ICD-10-CM | POA: Diagnosis not present

## 2015-11-06 DIAGNOSIS — M79606 Pain in leg, unspecified: Secondary | ICD-10-CM

## 2015-11-06 DIAGNOSIS — M79673 Pain in unspecified foot: Secondary | ICD-10-CM | POA: Diagnosis not present

## 2015-11-06 NOTE — Patient Instructions (Signed)
Seen for hypertrophic nails. All nails debrided. Return in 3 months or as needed.  

## 2015-11-06 NOTE — Progress Notes (Signed)
Subjective: 45 y.o. year old male presents complaining of painful nails. Orthotics have done well. Now start feeling some fatigue in both feet.    OBJECTIVE: DERMATOLOGIC EXAMINATION: Nails: Thick dystrophic nails x 10 R>L.  Ingrown nails both great toe both borders, painful. VASCULAR EXAMINATION OF LOWER LIMBS: Pedal pulses: All pedal pulses are palpable with normal pulsation.  NEUROLOGIC EXAMINATION OF THE LOWER LIMBS: All epicritic and tactile sensations grossly intact.  MUSCULOSKELETAL EXAMINATION: Noted of tight Achilles tendon bilateral with forefoot varus bilateral.  Positive of elevated first ray with short hallux.   ASSESSMENT: Improved foot pain with Orthotics. Onychomycosis x 10. Ingrown nail left great toe lateral border with pain.  Ankle equinus bilateral. Brachy metatarsal first ray bilateral.   PLAN: Reviewed clinical findings and available treatment options. All nails debrided.  Ingrown nail on Left great toe lateral border resected, cleansed with Iodine and dressed. Return in 3 months or as needed.

## 2015-11-13 ENCOUNTER — Other Ambulatory Visit: Payer: 59

## 2015-11-13 DIAGNOSIS — D751 Secondary polycythemia: Secondary | ICD-10-CM

## 2015-12-13 ENCOUNTER — Other Ambulatory Visit: Payer: Self-pay | Admitting: Internal Medicine

## 2015-12-18 ENCOUNTER — Ambulatory Visit (INDEPENDENT_AMBULATORY_CARE_PROVIDER_SITE_OTHER): Payer: 59 | Admitting: Internal Medicine

## 2015-12-18 ENCOUNTER — Encounter: Payer: Self-pay | Admitting: Internal Medicine

## 2015-12-18 VITALS — BP 118/74 | HR 79 | Ht 69.0 in | Wt 185.0 lb

## 2015-12-18 DIAGNOSIS — E1165 Type 2 diabetes mellitus with hyperglycemia: Secondary | ICD-10-CM

## 2015-12-18 DIAGNOSIS — IMO0001 Reserved for inherently not codable concepts without codable children: Secondary | ICD-10-CM

## 2015-12-18 LAB — LIPID PANEL
CHOL/HDL RATIO: 4
Cholesterol: 192 mg/dL (ref 0–200)
HDL: 51.1 mg/dL (ref >39.00)
NONHDL: 140.58
Triglycerides: 290 mg/dL — ABNORMAL HIGH (ref 0.0–149.0)
VLDL: 58 mg/dL — ABNORMAL HIGH (ref 0.0–40.0)

## 2015-12-18 LAB — LDL CHOLESTEROL, DIRECT: Direct LDL: 102 mg/dL

## 2015-12-18 NOTE — Progress Notes (Addendum)
Patient ID: Gabriel Hamilton, male   DOB: December 21, 1970, 45 y.o.   MRN: [id number]  HPI: Gabriel Hamilton is a 45 y.o.-year-old male, DM2,  dx 2007-8, non-insulin-dependent, uncontrolled, without complications. Last visit 6 mo ago.  Last hemoglobin A1c was: Lab Results  Component Value Date   HGBA1C 7.4 05/29/2015   HGBA1C 6.6* 01/30/2015   HGBA1C 7.1* 09/05/2014   At last visit we switched to: - Metformin 1000 mg 2x a day, with meals. Ran out of Metformin for 2 days last weekend. - Amaryl 4 mg daily in am. - Invokana 100 mg daily in am. He also tried [employer] >> worked well. He also tried Onglyza in the past.   Pt checks his sugars 2x a day and they are: - am: 91-160 >> 121-170 >> 99-164 (higher when eats late) >> 115-137,180x1 >> 120-140, 180 (higher if eats dinner late) - 2h after b'fast: n/c >> 101- 154 >> ~130 >> 115 >> n/c - before lunch: n/c >> 82-148 >> 120-130 >> n/c - 2h after lunch: n/c >> 95-155 >> 100-115 >> n/c >> 130s >> n/c - before dinner: 78-115 (180s off Kombiglyze) >> 80-118 >> 91-100 >> 68-128 >> 100-115 >> 95-108 - 2h after dinner: n/c >> 129-171 >> n/c >> 130-168 >> 140s >> 150s - bedtime: n/c >> 115-174, 207, 211 >> 130s >> see above - nighttime: n/c No lows. Lowest sugar was 82 >> 87 >> 68x1 >> 100; he has hypoglycemia awareness at 70.  Highest sugar was 280 >> 211 >> 188 >> 171 >> 180x1  Pt's meals are: - Breakfast: granola bar +/- fruit; may skip it - Lunch: salad, may skip - Dinner: meat (chicken, sausage, steak), veggies, starch (potatoes, rice); stews - Snack: walnuts, trailmix Drinks water, Gatorade.   He lost 30 lbs in the last years - now fluctuating 46-185. He is going to the gym 2x a week.   - no CKD, last BUN/creatinine:  Lab Results  Component Value Date   BUN 12 06/26/2015   CREATININE 0.97 06/26/2015  On Avapro. - last set of lipids: Lab Results  Component Value Date   CHOL 150 12/05/2014   HDL 49.20 12/05/2014   LDLCALC 74 12/05/2014    LDLDIRECT 88.4 07/15/2011   TRIG 134.0 12/05/2014   CHOLHDL 3 12/05/2014   - last eye exam was 01/09/2015 - Dr Gershon Crane. No DR.  - no numbness and tingling in his feet. Dr Caffie Pinto - podiatry.  I reviewed pt's medications, allergies, PMH, social hx, family hx, and changes were documented in the history of present illness. Otherwise, unchanged from my initial visit note. Stopped terbinafine.  ROS: Constitutional: no weight gain/loss, + fatigue, no subjective hyperthermia Eyes: + blurry vision, no xerophthalmia ENT: no sore throat, no nodules palpated in throat, no dysphagia/odynophagia, no hoarseness Cardiovascular: no CP /no SOB/palpitations/+ leg swelling Respiratory: no cough/SOB Gastrointestinal: no N/V/D/C,+ heartburn Musculoskeletal: + muscle/+ joint aches Skin: no rashes Neurological: no tremors/numbness/tingling/dizziness  PE: BP 118/74 mmHg  Pulse 79  Ht 5\' 9"  (1.753 m)  Wt 185 lb (83.915 kg)  BMI 27.31 kg/m2  SpO2 99% Body mass index is 27.31 kg/(m^2).  Wt Readings from Last 3 Encounters:  12/18/15 185 lb (83.915 kg)  09/25/15 182 lb (82.555 kg)  07/10/15 190 lb (86.183 kg)   Constitutional: overweight, in NAD Eyes: PERRLA, EOMI, no exophthalmos ENT: moist mucous membranes, no thyromegaly, no cervical lymphadenopathy Cardiovascular: tachycardia, RR, No MRG Respiratory: CTA B Gastrointestinal: abdomen soft, NT, ND, BS+  Musculoskeletal: no deformities, strength intact in all 4 Skin: moist, warm, no rashes Neurological: no tremor with outstretched hands, DTR normal in all 4  ASSESSMENT: 1. DM2, non-insulin-dependent, uncontrolled, without complications  PLAN:  1. Patient with long-standing, uncontrolled diabetes, on oral antidiabetic regimen, with great improvement after adding Invokana and changing diet. Sugars are similar to lat visit: higher in am and great later in the day. His last hemoglobin A1c was higher at last visit, at 7.4%. Sugars in log appeared  better >> will check a fructosamine today. - I suggested to: Patient Instructions  Please continue: - Metformin 1000 mg 2x a day, with meals. - Amaryl 4 mg daily in am. - Invokana 100 mg daily in am.  Please come back for a follow-up appointment in 3 months with your sugar log  - continue checking sugars at different times of the day - check 2 times a day, rotating checks - advised for yearly eye exams >> he is UTD - will check a lipid panel today (fasting). Last reviewed >> at goal. - Return to clinic in 3 mo with sugar log   Office Visit on 12/18/2015  Component Date Value Ref Range Status  . Cholesterol 12/18/2015 192  0 - 200 mg/dL Final   ATP III Classification       Desirable:  < 200 mg/dL               Borderline High:  200 - 239 mg/dL          High:  > = 240 mg/dL  . Triglycerides 12/18/2015 290.0* 0.0 - 149.0 mg/dL Final   Normal:  <150 mg/dLBorderline High:  150 - 199 mg/dL  . HDL 12/18/2015 51.10  >39.00 mg/dL Final  . VLDL 12/18/2015 58.0* 0.0 - 40.0 mg/dL Final  . Total CHOL/HDL Ratio 12/18/2015 4   Final                  Men          Women1/2 Average Risk     3.4          3.3Average Risk          5.0          4.42X Average Risk          9.6          7.13X Average Risk          15.0          11.0                      . NonHDL 12/18/2015 140.58   Final   NOTE:  Non-HDL goal should be 30 mg/dL higher than patient's LDL goal (i.e. LDL goal of < 70 mg/dL, would have non-HDL goal of < 100 mg/dL)  . Direct LDL 12/18/2015 102.0   Final   Optimal:  <100 mg/dLNear or Above Optimal:  100-129 mg/dLBorderline High:  130-159 mg/dLHigh:  160-189 mg/dLVery High:  >190 mg/dL   TG high, LDL closes to goal.  Component     Latest Ref Rng 12/18/2015  Fructosamine     0 - 285 umol/L 349 (H)   Calculated HbA1c 7.5%, which is higher than what I would expect, so I wonder if we are missing some high CBGs. However, based on his logged sugars, I will not adjust his medication regimen.

## 2015-12-18 NOTE — Patient Instructions (Addendum)
Please continue: - Metformin 1000 mg 2x a day, with meals. - Amaryl 4 mg daily in am. - Invokana 100 mg daily in am.  Please stop at the lab.  Please come back for a follow-up appointment in 3 months with your sugar log.

## 2015-12-19 LAB — FRUCTOSAMINE: Fructosamine: 349 umol/L — ABNORMAL HIGH (ref 0–285)

## 2015-12-28 ENCOUNTER — Other Ambulatory Visit: Payer: Self-pay | Admitting: Internal Medicine

## 2016-02-05 ENCOUNTER — Ambulatory Visit (INDEPENDENT_AMBULATORY_CARE_PROVIDER_SITE_OTHER): Payer: 59 | Admitting: Podiatry

## 2016-02-05 ENCOUNTER — Encounter: Payer: Self-pay | Admitting: Podiatry

## 2016-02-05 VITALS — BP 136/87 | HR 87

## 2016-02-05 DIAGNOSIS — L600 Ingrowing nail: Secondary | ICD-10-CM

## 2016-02-05 DIAGNOSIS — B351 Tinea unguium: Secondary | ICD-10-CM | POA: Diagnosis not present

## 2016-02-05 DIAGNOSIS — M79606 Pain in leg, unspecified: Secondary | ICD-10-CM | POA: Diagnosis not present

## 2016-02-05 NOTE — Progress Notes (Signed)
Subjective: 45 y.o. year old male presents complaining of painful nails. Also having sporadic burning and itching on top and ball of both feet. Also having pain in heel and lateral 5th metatarsal base duration of a month.  Orthotics have done well. Now start feeling some fatigue in both feet.    OBJECTIVE: DERMATOLOGIC EXAMINATION: Nails: Thick dystrophic nails x 10 R>L.  Ingrown nails both great toe both borders, painful. VASCULAR EXAMINATION OF LOWER LIMBS: Pedal pulses: All pedal pulses are palpable with normal pulsation.  NEUROLOGIC EXAMINATION OF THE LOWER LIMBS: All epicritic and tactile sensations grossly intact.  MUSCULOSKELETAL EXAMINATION: Noted of tight Achilles tendon bilateral with forefoot varus bilateral.  Positive of elevated first ray with short hallux.   ASSESSMENT: Improved foot pain with Orthotics but still hurts. Onychomycosis x 10. Ingrown nail left great toe lateral border with pain.  Ankle equinus bilateral. Brachy metatarsal first ray bilateral.   PLAN: Reviewed clinical findings and available treatment options, conservative and surgical (Cotton osteotomy with bone graft). All nails debrided.  Patient wish to return for ingrown nail surgery. Return in 3 months or as needed.

## 2016-02-05 NOTE — Patient Instructions (Signed)
Seen for hypertrophic nails and ingrown nails. All nails debrided. May benefit from ingrown nail surgery. Return in 3 months or as needed.

## 2016-02-07 ENCOUNTER — Encounter: Payer: Self-pay | Admitting: Podiatry

## 2016-02-07 ENCOUNTER — Ambulatory Visit (INDEPENDENT_AMBULATORY_CARE_PROVIDER_SITE_OTHER): Payer: 59 | Admitting: Podiatry

## 2016-02-07 DIAGNOSIS — M79673 Pain in unspecified foot: Secondary | ICD-10-CM | POA: Diagnosis not present

## 2016-02-07 DIAGNOSIS — M79606 Pain in leg, unspecified: Secondary | ICD-10-CM

## 2016-02-07 DIAGNOSIS — L600 Ingrowing nail: Secondary | ICD-10-CM | POA: Diagnosis not present

## 2016-02-07 NOTE — Progress Notes (Signed)
Patient presents to have scheduled toe nail surgery left great toe both borders. Consent form reviewed and Phenol Alcohol matrixectomy done on both borders of left great toe. Procedure done as follow; Affected left great toe was anesthetized with total 42ml mixture of 50/50 0.5% Marcaine plain and 1% Xylocaine plain. Medial and lateral nails borders were reflected with a nail elevator and excised with nail nipper. Both borders of proximal nail matrix tissue were cauterized with Phenol soaked cotton applicator x 4 and neutralized with Alcohol soaked cotton applicator. The wound was dressed with Amerigel ointment dressing. Home care instructions and supply dispensed.  Return in 1 week for follow up.

## 2016-02-07 NOTE — Patient Instructions (Signed)
Ingrown nail surgery was done on left great toe. Follow soaking instruction.  Some redness and drainage is expected. Call the office if the area gets feverish with increased redness and drainage.  

## 2016-02-12 ENCOUNTER — Encounter: Payer: 59 | Admitting: Podiatry

## 2016-02-12 ENCOUNTER — Ambulatory Visit (INDEPENDENT_AMBULATORY_CARE_PROVIDER_SITE_OTHER): Payer: 59 | Admitting: Podiatry

## 2016-02-12 ENCOUNTER — Other Ambulatory Visit: Payer: Self-pay | Admitting: Internal Medicine

## 2016-02-12 DIAGNOSIS — L600 Ingrowing nail: Secondary | ICD-10-CM

## 2016-02-12 MED ORDER — AMOXICILLIN-POT CLAVULANATE 875-125 MG PO TABS: 1.0000 | ORAL_TABLET | Freq: Two times a day (BID) | ORAL | 0 refills | Status: DC

## 2016-02-12 NOTE — Patient Instructions (Signed)
1 week post op left great toe nail surgery. Has some redness. Augmentin prescribed. Take for the next 10 days. Use Amerigel ointment for dressing change and do daily soaking. Return as needed.

## 2016-02-13 ENCOUNTER — Encounter: Payer: 59 | Admitting: Podiatry

## 2016-02-13 ENCOUNTER — Encounter: Payer: Self-pay | Admitting: Podiatry

## 2016-02-13 NOTE — Progress Notes (Signed)
1 week post op nail surgery left great toe both borders. Been soaking and following instruction. He is on low dose of antibiotics for acne.   Had to be on feet daily.  Both nails borders are clean with minimum drainage but has surrounding erythema.   Assessment: Post op inflammation without active drainage.  Plan: Continue do daily soak. Start taking Augmentin. Return in 10 days if redness continues or fail to improve.

## 2016-02-14 NOTE — Telephone Encounter (Signed)
Patient is saying he now needs a PA for metformin, do you want to do this or send in another medication? Please advise. Thank you!

## 2016-02-14 NOTE — Telephone Encounter (Signed)
Patient I  calling on the status of PA for medication metFORMIN (GLUCOPHAGE) 1000 MG tablet, it's been a week and haven't heard anything yet. Please advise

## 2016-02-17 ENCOUNTER — Other Ambulatory Visit: Payer: Self-pay

## 2016-02-17 MED ORDER — METFORMIN HCL 1000 MG PO TABS: ORAL_TABLET | ORAL | 1 refills | Status: DC

## 2016-02-17 NOTE — Telephone Encounter (Signed)
Pt is asking for the status of this metformin rx

## 2016-02-17 NOTE — Telephone Encounter (Signed)
Tried to send in Metformin again, as I never heard back from [facility] about doing a PA. Will see if this goes through.

## 2016-02-26 ENCOUNTER — Ambulatory Visit (INDEPENDENT_AMBULATORY_CARE_PROVIDER_SITE_OTHER): Payer: Managed Care, Other (non HMO) | Admitting: Podiatry

## 2016-02-26 ENCOUNTER — Encounter: Payer: Self-pay | Admitting: Podiatry

## 2016-02-26 DIAGNOSIS — Z9889 Other specified postprocedural states: Secondary | ICD-10-CM

## 2016-02-26 NOTE — Patient Instructions (Signed)
Follow up on left great toe nail surgery. Complete wound healing has not achieved. Continue soaking daily. Keep the area open at night. Wear open toed shoes at work. Return as needed.

## 2016-02-26 NOTE — Progress Notes (Signed)
3 weeks post op nail left great toe. Stated that he was not able to stay off of feet, but finished his antibiotics. Corner of nail is still draining and concerned with toe color being darker.  Objective: No visible edema noted. Residual clear drainage consistent with slow healing wound. Previous inflammation has calm down and faded to darker skin color. Plan: Continue soaking daily. Use antibiotic cream dressing. Stay in open toed shoes at work and leave the toe in open air at night. Return as needed.

## 2016-02-29 ENCOUNTER — Other Ambulatory Visit: Payer: Self-pay | Admitting: Internal Medicine

## 2016-03-13 ENCOUNTER — Other Ambulatory Visit: Payer: Self-pay | Admitting: Internal Medicine

## 2016-03-18 ENCOUNTER — Ambulatory Visit (INDEPENDENT_AMBULATORY_CARE_PROVIDER_SITE_OTHER): Payer: Managed Care, Other (non HMO) | Admitting: Podiatry

## 2016-03-18 ENCOUNTER — Encounter: Payer: Self-pay | Admitting: Internal Medicine

## 2016-03-18 ENCOUNTER — Ambulatory Visit (INDEPENDENT_AMBULATORY_CARE_PROVIDER_SITE_OTHER): Payer: Managed Care, Other (non HMO) | Admitting: Internal Medicine

## 2016-03-18 ENCOUNTER — Encounter: Payer: Self-pay | Admitting: Podiatry

## 2016-03-18 VITALS — BP 132/84 | HR 88 | Ht 68.5 in | Wt 185.0 lb

## 2016-03-18 DIAGNOSIS — IMO0001 Reserved for inherently not codable concepts without codable children: Secondary | ICD-10-CM

## 2016-03-18 DIAGNOSIS — L03032 Cellulitis of left toe: Secondary | ICD-10-CM

## 2016-03-18 DIAGNOSIS — E1165 Type 2 diabetes mellitus with hyperglycemia: Secondary | ICD-10-CM

## 2016-03-18 DIAGNOSIS — L02612 Cutaneous abscess of left foot: Secondary | ICD-10-CM

## 2016-03-18 LAB — POCT GLYCOSYLATED HEMOGLOBIN (HGB A1C): Hemoglobin A1C: 7.7

## 2016-03-18 MED ORDER — CANAGLIFLOZIN 100 MG PO TABS: 100.0000 mg | ORAL_TABLET | Freq: Every morning | ORAL | 11 refills | Status: DC

## 2016-03-18 MED ORDER — GLUCOSE BLOOD VI STRP: ORAL_STRIP | 11 refills | Status: DC

## 2016-03-18 MED ORDER — AMOXICILLIN-POT CLAVULANATE 875-125 MG PO TABS: 1.0000 | ORAL_TABLET | Freq: Two times a day (BID) | ORAL | 0 refills | Status: DC

## 2016-03-18 MED ORDER — METFORMIN HCL 1000 MG PO TABS: ORAL_TABLET | ORAL | 11 refills | Status: DC

## 2016-03-18 MED ORDER — ACCU-CHEK FASTCLIX LANCETS MISC: 11 refills | Status: DC

## 2016-03-18 NOTE — Progress Notes (Signed)
Patient ID: Gabriel Hamilton, male   DOB: March 08, 1971, 45 y.o.   MRN: [id number]  HPI: Gabriel Hamilton is a 45 y.o.-year-old male, DM2,  dx 2007-8, non-insulin-dependent, uncontrolled, without complications. Last visit 3 mo ago.  He usually has more stress at work during the summer.   Last hemoglobin A1c was: 12/18/2015: Calculated HbA1c from [employer]: 7.5% Lab Results  Component Value Date   HGBA1C 7.4 05/29/2015   HGBA1C 6.6 (H) 01/30/2015   HGBA1C 7.1 (H) 09/05/2014   At last visit we switched to: - Metformin 1000 mg 2x a day, with meals. - Amaryl 4 mg daily in am. - Invokana 100 mg daily in am. He also tried [place] >> worked well. He also tried Onglyza in the past.   Pt checks his sugars 2x a day and they are: - am: 99-164 (higher when eats late) >> 115-137,180x1 >> 120-140, 180 (higher if eats dinner late) >> 131-171, 186 - 2h after b'fast: n/c >> 101- 154 >> ~130 >> 115 >> n/c >> 147 - before lunch: n/c >> 82-148 >> 120-130 >> n/c - 2h after lunch: n/c >> 95-155 >> 100-115 >> n/c >> 130s >> n/c - before dinner: 78-115 (180s off Kombiglyze) >> 80-118 >> 91-100 >> 68-128 >> 100-115 >> 95-108 >> 101-164, 177 - 2h after dinner: n/c >> 129-171 >> n/c >> 130-168 >> 140s >> 150s - bedtime: n/c >> 115-174, 207, 211 >> 130s >> see above - nighttime: n/c No lows. Lowest sugar was 82 >> 87 >> 68x1 >> 100 >> 101; he has hypoglycemia awareness at 70.  Highest sugar was 280 >> 211 >> 188 >> 171 >> 180x1 >> 186   Pt's meals are: - Breakfast: granola bar +/- fruit; may skip it - Lunch: salad, may skip - Dinner: meat (chicken, sausage, steak), veggies, starch (potatoes, rice); stews - Snack: walnuts, trailmix Drinks water, Gatorade.   He lost 30 lbs in the last years - now fluctuating 177-185. He is going to the gym 2x a week.   - no CKD, last BUN/creatinine:  Lab Results  Component Value Date   BUN 12 06/26/2015   CREATININE 0.97 06/26/2015  On Avapro. - last set of  lipids: Lab Results  Component Value Date   CHOL 192 12/18/2015   HDL 51.10 12/18/2015   LDLCALC 74 12/05/2014   LDLDIRECT 102.0 12/18/2015   TRIG 290.0 (H) 12/18/2015   CHOLHDL 4 12/18/2015   - last eye exam was 01/09/2015 - Dr Gershon Crane. No DR.  - no numbness and tingling in his feet. Dr Caffie Pinto - podiatry.  I reviewed pt's medications, allergies, PMH, social hx, family hx, and changes were documented in the history of present illness. Otherwise, unchanged from my initial visit note. Stopped terbinafine.  ROS: Constitutional: no weight gain/loss, + fatigue, no subjective hyperthermia Eyes: no blurry vision, no xerophthalmia ENT: no sore throat, no nodules palpated in throat, no dysphagia/odynophagia, no hoarseness Cardiovascular: no CP /no SOB/palpitations/ leg swelling Respiratory: no cough/SOB Gastrointestinal: no N/V/D/C,+ heartburn Musculoskeletal: + muscle/+ joint aches Skin: no rashes Neurological: no tremors/numbness/tingling/dizziness  PE: BP 132/84 (BP Location: Left Arm, Patient Position: Sitting)   Pulse 88   Ht 5' 8.5" (1.74 m)   Wt 185 lb (83.9 kg)   SpO2 98%   BMI 27.72 kg/m  Body mass index is 27.72 kg/m.  Wt Readings from Last 3 Encounters:  03/18/16 185 lb (83.9 kg)  12/18/15 185 lb (83.9 kg)  09/25/15 182 lb (82.6 kg)  Constitutional: overweight, in NAD Eyes: PERRLA, EOMI, no exophthalmos ENT: moist mucous membranes, no thyromegaly, no cervical lymphadenopathy Cardiovascular: RRR, No MRG Respiratory: CTA B Gastrointestinal: abdomen soft, NT, ND, BS+ Musculoskeletal: no deformities, strength intact in all 4 Skin: moist, warm, no rashes Neurological: no tremor with outstretched hands, DTR normal in all 4  ASSESSMENT: 1. DM2, non-insulin-dependent, uncontrolled, without complications  PLAN:  1. Patient with long-standing, uncontrolled diabetes, on oral antidiabetic regimen, with great improvement after adding Invokana and changing diet, but now  sugars increasing again: more stress at work over the summer. As sugars are higher in am >> will move Metformin at dinnertime. If this is not enough, we can add back Januvia. - I suggested to: Patient Instructions  Please move all Metformin at dinnertime (2000 mg).  Let me know about the sugars in 3-4 weeks to see if we need to add Januvia.  Please continue: - Amaryl 4 mg daily in am. - Invokana 100 mg daily in am.  Please come back for a follow-up appointment in 3 months.  - continue checking sugars at different times of the day - check 2 times a day, rotating checks - advised for yearly eye exams >> he needs one >> scheduled - HbA1c today: 7.7% (worse) - Return to clinic in 3 mo with sugar log   Philemon Kingdom, MD PhD [facility] Endocrinology

## 2016-03-18 NOTE — Addendum Note (Signed)
Addended by: Caprice Beaver T on: 03/18/2016 10:23 AM   Modules accepted: Orders

## 2016-03-18 NOTE — Progress Notes (Signed)
Subjective: 45 y.o. year old male presents stating that he noted a raised area on left big toe a couple of days ago. Now the area has popped open and draining. He also noted that the surgical site of the nail border is dry and stopped draining a week ago.   HPI: Controlled NIDDM.  Was having chronic painful nails on great toes.  Ingrown nail surgery done on 02/07/16 at lateral border of left great toe. 5 days later he has developed redness with swollen toe. He was placed in Augmentin 875/125 for 10 days. The wound was  slow healing, which was attributed to daily walk in closed in shoes at work. He was faithful with daily soaking and dressing change.  He is wearing custom orthotics for heel pain that is doing well.   OBJECTIVE: DERMATOLOGIC EXAMINATION: Dry and well healed nail border at surgery site, lateral border left hallux. Positive of 0.4 cm diameter round skin opening with drainage at lateral aspect of left hallux at about near the base of distal phalanx. Noted no redness in surrounding soft tissue.  No advancing proximal cellulitis or edema noted. VASCULAR EXAMINATION OF LOWER LIMBS: All pedal pulses are palpable with normal pulsation.  NEUROLOGIC EXAMINATION OF THE LOWER LIMBS: All epicritic and tactile sensations grossly intact.  MUSCULOSKELETAL EXAMINATION: Noted of tight Achilles tendon bilateral with forefoot varus bilateral.  Positive of elevated first ray with short hallux.   ASSESSMENT: S/P Ingrown nail surgery left hallux lateral border. Localized abscess at lateral aspect, with 1 cm long sinus tract advancing to soft tissue in plantar lateral direction.  PLAN: Reviewed clinical findings and available treatment options. The left great toe anesthetized with total 5 ml of 50/50 mixture 0.5% Marcaine plain and 1 % Xylocaine with epinephrine. The left great toe prepped well with Iodine solution.  The draining area opened by making about 2cm long skin incision with #15  blade.  Area investigated and removed loose debris using Barrister's clerk under sterile technique. Noted of localized void pocket at soft tissue. No proximal joint or bony involvement noted. Area flushed with copious amount of normal sterile saline.  5/0 Nylon suture placed.  Small Iodine gauze packing placed in void packet to facilitate drainage and followed with Iodine wet to dry dressing.  Will remove the packing this Friday. Rx for Augmentin 875/125 e-scribed. Patient is to keep the dressing intact till the next appointment.

## 2016-03-18 NOTE — Patient Instructions (Signed)
Seen for draining toe. Incision and drainage done under local. Reviewed findings and cause of the issue. Keep the dressing intact and not disturbed. Return this Friday for dressing change.

## 2016-03-18 NOTE — Patient Instructions (Addendum)
Please move all Metformin at dinnertime (2000 mg).  Let me know about the sugars in 3-4 weeks to see if we need to add Januvia.  Please continue: - Amaryl 4 mg daily in am. - Invokana 100 mg daily in am.  Please come back for a follow-up appointment in 3 months.

## 2016-03-20 ENCOUNTER — Ambulatory Visit (INDEPENDENT_AMBULATORY_CARE_PROVIDER_SITE_OTHER): Payer: Managed Care, Other (non HMO) | Admitting: Podiatry

## 2016-03-20 ENCOUNTER — Encounter: Payer: Self-pay | Admitting: Podiatry

## 2016-03-20 DIAGNOSIS — L02612 Cutaneous abscess of left foot: Secondary | ICD-10-CM

## 2016-03-20 DIAGNOSIS — L03032 Cellulitis of left toe: Secondary | ICD-10-CM

## 2016-03-20 NOTE — Patient Instructions (Signed)
Wound healing in progress with subsided edema left great toe. Do daily dressing change with Iodine wet to dry dressing. Return in 5 days.

## 2016-03-20 NOTE — Progress Notes (Signed)
2 day follow up on I&D abscess left great toe. He is on his way out to beach trip. Clean well maintained dressing. Wound packing removed. No active drainage noted. Wound has decreased in swelling with opening at packing site about 0.3 cm in diameter. Wound cleansed with Iodine solution and Iodine wet to dry dressing applied with instruction to continue daily. Return in 5 days after beach trip.

## 2016-03-25 ENCOUNTER — Encounter: Payer: Self-pay | Admitting: Podiatry

## 2016-03-25 ENCOUNTER — Ambulatory Visit (INDEPENDENT_AMBULATORY_CARE_PROVIDER_SITE_OTHER): Payer: Managed Care, Other (non HMO) | Admitting: Podiatry

## 2016-03-25 DIAGNOSIS — L600 Ingrowing nail: Secondary | ICD-10-CM | POA: Diagnosis not present

## 2016-03-25 DIAGNOSIS — M79671 Pain in right foot: Secondary | ICD-10-CM | POA: Diagnosis not present

## 2016-03-25 DIAGNOSIS — M79672 Pain in left foot: Secondary | ICD-10-CM

## 2016-03-25 DIAGNOSIS — B351 Tinea unguium: Secondary | ICD-10-CM

## 2016-03-25 NOTE — Patient Instructions (Addendum)
Left great toe wound is healing with no edema or erythema. Painful nail with ingrown right great toe nail. Suture removed on left great toe wound and Betadine dressing placed with instruction on daily dressing change. All nails debrided. Return in one week for follow up on left great toe.

## 2016-03-25 NOTE — Progress Notes (Signed)
Subjective: 45 year old NIDDM presents for 1 week follow up on I&D abscess left great toe. He believes the wound is healing well. He rested and changed dressing daily as instructed with Iodine solution. He also request nails trimmed. They are thick, ingrown and painful on right foot.   History: Has had Phenol and Alcohol matrixectomy left great toe lateral border. Wound healing was slow with increased redness at nail border. Patient was placed in antibiotics. After the border was healed patient developed abscessed left great toe.  Objective: Left great toe is covered with clean dressing. Wound is closing off with intact suture. Thick dystrophic nails x 10. Ingrown nail symptomatic right great toe without infection.  Assessment: Satisfactory wound healing left great toe. Onychomycosis with nail deformity x 10. Ingrown nail right great toe.  Neurovascular status are within normal.  Plan: Left great toe wound cleansed with Iodine solution. Suture removed. Iodine wet to dry dressing applied with instruction. All nails debrided on both feet.  Ok to shower and do dressing change after each shower. Return in one week.

## 2016-04-01 ENCOUNTER — Encounter: Payer: Self-pay | Admitting: Family Medicine

## 2016-04-01 ENCOUNTER — Encounter: Payer: Self-pay | Admitting: Podiatry

## 2016-04-01 ENCOUNTER — Ambulatory Visit (INDEPENDENT_AMBULATORY_CARE_PROVIDER_SITE_OTHER): Payer: Managed Care, Other (non HMO) | Admitting: Podiatry

## 2016-04-01 ENCOUNTER — Ambulatory Visit (INDEPENDENT_AMBULATORY_CARE_PROVIDER_SITE_OTHER): Payer: Managed Care, Other (non HMO) | Admitting: Family Medicine

## 2016-04-01 VITALS — BP 110/82 | HR 85 | Temp 98.3°F | Wt 184.4 lb

## 2016-04-01 DIAGNOSIS — K529 Noninfective gastroenteritis and colitis, unspecified: Secondary | ICD-10-CM | POA: Diagnosis not present

## 2016-04-01 DIAGNOSIS — R112 Nausea with vomiting, unspecified: Secondary | ICD-10-CM | POA: Diagnosis not present

## 2016-04-01 DIAGNOSIS — Z9889 Other specified postprocedural states: Secondary | ICD-10-CM

## 2016-04-01 MED ORDER — ONDANSETRON 4 MG PO TBDP: 4.0000 mg | ORAL_TABLET | Freq: Three times a day (TID) | ORAL | 0 refills | Status: DC | PRN

## 2016-04-01 NOTE — Patient Instructions (Signed)
Wound healing in progress on left great toe. Continue daily dressing. Return in 2 weeks.

## 2016-04-01 NOTE — Progress Notes (Signed)
Subjective: 45 year old NIDDM presents for follow up on I&D abscess left great toe. He is doing daily dressing change as instructed with Iodine solution.   History:  Has had Phenol and Alcohol matrixectomy left great toe lateral border (02/12/16). Patient had to be back to work walking all day in closed in shoes. Wound healing was slow with increased redness at nail border. Patient was placed in antibiotics. After the border was healed patient developed abscessed left great toe. This area was opened and drained (03/20/16). Patient was placed back on antibiotics.  Objective: Left great toe is covered with clean dressing. Wound is closing off with in 0.4 cm open base. Wound is superficial. No drainage or erythema noted.   Assessment: Wound healing in progress left great toe. Neurovascular status are within normal.  Plan: Left great toe wound cleansed with Iodine solution.  Amerigel ointment dressing applied. Ok to shower and do dressing change after each shower. Return in 2 weeks.

## 2016-04-01 NOTE — Progress Notes (Signed)
Subjective:  Gabriel Hamilton is a 45 y.o. year old very pleasant male patient who presents for/with See problem oriented charting ROS- No chest pain or shortness of breath. No headache or blurry vision. No fevers.see any ROS included in HPI as well.   Past Medical History-  Patient Active Problem List   Diagnosis Date Noted  . Tobacco abuse 12/22/2011    Priority: High  . Uncontrolled diabetes mellitus type 2 without complications ([place]) 123456    Priority: High  . Hypertriglyceridemia 06/06/2014    Priority: Medium  . Polycythemia 08/26/2011    Priority: Medium  . Essential hypertension 07/10/2009    Priority: Medium  . Tachycardia 02/14/2014    Priority: Low  . Acne     Priority: Low  . HYPOGONADISM 11/21/2009    Priority: Low  . GERD 03/27/2008    Priority: Low  . HEART MURMUR 03/27/2008    Priority: Low  . Uncontrolled type 2 diabetes mellitus without complication, without long-term current use of insulin ([place]) 12/18/2015  . Ingrown nail 07/24/2015  . 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 Equinus deformity of foot, acquired 05/08/2015  . Onychomycosis 12/26/2014  . Pain in lower limb 12/26/2014    Medications- reviewed and updated Current Outpatient Prescriptions  Medication Sig Dispense Refill  . ACCU-CHEK FASTCLIX LANCETS MISC Test 2x a day 100 each 11  . canagliflozin (INVOKANA) 100 MG TABS tablet Take 1 tablet (100 mg total) by mouth every morning. 30 tablet 11  . glimepiride (AMARYL) 4 MG tablet TAKE ONE TABLET BY MOUTH ONE TIME DAILY BEFORE BREAKFAST 30 tablet 11  . glucose blood (ACCU-CHEK SMARTVIEW) test strip TEST TWICE DAILY 100 each 11  . metFORMIN (GLUCOPHAGE) 1000 MG tablet TAKE ONE TABLET TWICE DAILY WITH MEALS 60 tablet 11  . naproxen sodium (ANAPROX) 220 MG tablet USING FOR JOINT PAIN    . omeprazole (PRILOSEC) 20 MG capsule Take 20 mg by mouth daily.    . ondansetron (ZOFRAN-ODT) 4 MG disintegrating  tablet Take 1 tablet (4 mg total) by mouth every 8 (eight) hours as needed for nausea or vomiting. 20 tablet 0   No current facility-administered medications for this visit.     Objective: BP 110/82 (BP Location: Left Arm, Patient Position: Sitting, Cuff Size: Large)   Pulse 85   Temp 98.3 F (36.8 C) (Oral)   Wt 184 lb 6.4 oz (83.6 kg)   SpO2 98%   BMI 27.63 kg/m  Gen: well appearing Mucous membranes are moist. CV: RRR no murmurs rubs or gallops Lungs: CTAB no crackles, wheeze, rhonchi Abdomen: soft/nontender/nondistended/normal bowel sounds. No rebound or guarding.  Ext: no edema Skin: warm, dry, no rash  Assessment/Plan:  Emesis, nausea S: Friday had lunch at [employer] and had shrimp. Nausea that day and with low level through the weekend. Sunday night got up 3 AM had some diarrhea and then Monday again 3 AM but no symptoms suring the day other than nausea. Yesterday started with fatigue, threw up a few times, slightly dizzy. Tried banana and did not stay down. Liquids are staying down. Ginger ale settles stomach. 3 episodes of vomiting yesterday- no blood and no bile. All food contents that came back up. Feels somewhat better today. No more diarrhea since Monday night. Some mild abdominal pain around umbilicus. Has been on antibiotics for toe but off since Saturday or Sunday. No one has been sick around him other than coworker about a week ago with  stomach virus. Sugars have been around 100. Mild abdominal pain during this.   Started metformin all in the evening 2000mg  Thursday night and Friday night. Went back to metformin 1g BID on Saturday   A/P: appears likely gastroenteritis. Will treat nausea with zofran. Hold metformin for a few days as per avs. Hydration, rest, good handwashing advised. Contagious nature discussed as well as return precautions  Meds ordered this encounter  Medications  . ondansetron (ZOFRAN-ODT) 4 MG disintegrating tablet    Sig: Take 1 tablet (4 mg  total) by mouth every 8 (eight) hours as needed for nausea or vomiting.    Dispense:  20 tablet    Refill:  0   Garret Reddish, MD

## 2016-04-01 NOTE — Progress Notes (Signed)
Pre visit review using our clinic review tool, if applicable. No additional management support is needed unless otherwise documented below in the visit note. 

## 2016-04-01 NOTE — Patient Instructions (Signed)
Does seem like a stomach bug and it is slowing down in expected frame (within 3-7 days)  Zofran for nausea before breakfast and dinner next 2 days. Hold metformin for those 2 days then restart 1 pill a day for 2 days then can go back to 1 pill twice a day if feeling well.   Follow up if fever, new or worsening symptoms or symptoms that do not resolve by next week

## 2016-04-15 ENCOUNTER — Ambulatory Visit (INDEPENDENT_AMBULATORY_CARE_PROVIDER_SITE_OTHER): Payer: Managed Care, Other (non HMO) | Admitting: Podiatry

## 2016-04-15 ENCOUNTER — Encounter: Payer: Self-pay | Admitting: Podiatry

## 2016-04-15 DIAGNOSIS — Z9889 Other specified postprocedural states: Secondary | ICD-10-CM

## 2016-04-15 NOTE — Progress Notes (Signed)
Subjective: 45 year old NIDDM presents for follow up on I&D abscess left great toe.  History:  Has had Phenol and Alcohol matrixectomy left great toe lateral border (02/12/16). Patient had to be back to work walking all day in closed in shoes. Wound healing was slow with increased redness at nail border. Patient was placed in antibiotics. After the border was healed patient developed abscessed left great toe. This area was opened and drained (03/20/16). Patient was placed back on antibiotics.  Objective: Wound open base is about the same as the last visit, 0.4 cm open base.  Wound is superficial. No drainage or erythema noted.   Assessment: Slow healing wound following I&D of abscess left great toe. Neurovascular status are within normal.  Plan: Left great toe wound cleansed with Iodine solution.  Amerigel ointment dressing applied. Instructed to do nightly dressing with Normal saline gauze dressing and Amerigel ointment dressing during the day. Return in 2 weeks.

## 2016-04-15 NOTE — Patient Instructions (Signed)
Slow healing wound. Continue do daily wound care. May use Amerigel ointment for dressing change.

## 2016-05-05 ENCOUNTER — Encounter: Payer: Managed Care, Other (non HMO) | Admitting: Podiatry

## 2016-05-05 LAB — HM DIABETES EYE EXAM

## 2016-05-06 ENCOUNTER — Ambulatory Visit (INDEPENDENT_AMBULATORY_CARE_PROVIDER_SITE_OTHER): Payer: Managed Care, Other (non HMO) | Admitting: Podiatry

## 2016-05-06 ENCOUNTER — Encounter: Payer: Self-pay | Admitting: Family Medicine

## 2016-05-06 ENCOUNTER — Encounter: Payer: Self-pay | Admitting: Podiatry

## 2016-05-06 DIAGNOSIS — Z9889 Other specified postprocedural states: Secondary | ICD-10-CM

## 2016-05-06 NOTE — Patient Instructions (Signed)
Post op wound healing progressing well. Continue with current level of care. Return as needed.

## 2016-05-06 NOTE — Progress Notes (Signed)
Post op wound check on left great toe. Wound healing in progress with clean base and minimum opening. Continue keep it clean and dressed with antibiotic cream. Return as needed.

## 2016-07-01 ENCOUNTER — Ambulatory Visit: Payer: 59 | Admitting: Internal Medicine

## 2016-07-20 ENCOUNTER — Encounter: Payer: Self-pay | Admitting: Internal Medicine

## 2016-07-29 ENCOUNTER — Ambulatory Visit: Payer: Managed Care, Other (non HMO) | Admitting: Family Medicine

## 2016-07-29 ENCOUNTER — Encounter: Payer: Self-pay | Admitting: Internal Medicine

## 2016-08-05 ENCOUNTER — Other Ambulatory Visit: Payer: Self-pay

## 2016-08-05 MED ORDER — ACCU-CHEK GUIDE W/DEVICE KIT: 1.0000 | PACK | Freq: Every day | 0 refills | Status: DC

## 2016-08-05 MED ORDER — ACCU-CHEK FASTCLIX LANCETS MISC: 11 refills | Status: DC

## 2016-08-05 MED ORDER — GLUCOSE BLOOD VI STRP: ORAL_STRIP | 11 refills | Status: DC

## 2016-08-07 ENCOUNTER — Other Ambulatory Visit: Payer: Self-pay

## 2016-08-07 MED ORDER — ONETOUCH VERIO FLEX SYSTEM W/DEVICE KIT: 1.0000 | PACK | Freq: Every day | 0 refills | Status: DC

## 2016-08-07 MED ORDER — GLUCOSE BLOOD VI STRP: ORAL_STRIP | 5 refills | Status: DC

## 2016-08-07 MED ORDER — ONETOUCH ULTRASOFT LANCETS MISC: 5 refills | Status: DC

## 2016-08-10 ENCOUNTER — Other Ambulatory Visit: Payer: Self-pay

## 2016-08-10 MED ORDER — ACCU-CHEK GUIDE VI STRP: ORAL_STRIP | 5 refills | Status: DC

## 2016-08-12 ENCOUNTER — Other Ambulatory Visit: Payer: Self-pay

## 2016-08-12 ENCOUNTER — Telehealth: Payer: Self-pay

## 2016-08-12 MED ORDER — GLUCOSE BLOOD VI STRP: ORAL_STRIP | 5 refills | Status: DC

## 2016-08-12 MED ORDER — BAYER CONTOUR NEXT MONITOR W/DEVICE KIT: PACK | 0 refills | Status: DC

## 2016-08-12 MED ORDER — BAYER MICROLET LANCETS MISC: 5 refills | Status: DC

## 2016-08-12 NOTE — Telephone Encounter (Signed)
Called and LVM for patient to call back to let us know which insurance he is using now so I can send in a new meter and test strips. Gave call back number.

## 2016-08-26 ENCOUNTER — Ambulatory Visit: Payer: Managed Care, Other (non HMO) | Admitting: Family Medicine

## 2016-08-26 ENCOUNTER — Other Ambulatory Visit: Payer: Self-pay | Admitting: Family Medicine

## 2016-09-02 ENCOUNTER — Ambulatory Visit: Payer: Managed Care, Other (non HMO) | Admitting: Internal Medicine

## 2016-09-30 ENCOUNTER — Ambulatory Visit: Payer: BLUE CROSS/BLUE SHIELD | Admitting: Family Medicine

## 2016-10-14 ENCOUNTER — Ambulatory Visit: Payer: BLUE CROSS/BLUE SHIELD | Admitting: Family Medicine

## 2016-10-21 ENCOUNTER — Encounter: Payer: Self-pay | Admitting: Family Medicine

## 2016-10-21 ENCOUNTER — Telehealth: Payer: Self-pay

## 2016-10-21 ENCOUNTER — Ambulatory Visit (INDEPENDENT_AMBULATORY_CARE_PROVIDER_SITE_OTHER): Payer: BLUE CROSS/BLUE SHIELD | Admitting: Family Medicine

## 2016-10-21 VITALS — BP 114/72 | HR 92 | Temp 98.5°F | Ht 68.5 in | Wt 186.2 lb

## 2016-10-21 DIAGNOSIS — G8929 Other chronic pain: Secondary | ICD-10-CM

## 2016-10-21 DIAGNOSIS — IMO0001 Reserved for inherently not codable concepts without codable children: Secondary | ICD-10-CM

## 2016-10-21 DIAGNOSIS — M25561 Pain in right knee: Secondary | ICD-10-CM | POA: Diagnosis not present

## 2016-10-21 DIAGNOSIS — E1165 Type 2 diabetes mellitus with hyperglycemia: Secondary | ICD-10-CM

## 2016-10-21 DIAGNOSIS — M25572 Pain in left ankle and joints of left foot: Secondary | ICD-10-CM

## 2016-10-21 DIAGNOSIS — M25562 Pain in left knee: Secondary | ICD-10-CM | POA: Diagnosis not present

## 2016-10-21 DIAGNOSIS — M25571 Pain in right ankle and joints of right foot: Secondary | ICD-10-CM

## 2016-10-21 LAB — CBC WITH DIFFERENTIAL/PLATELET
BASOS PCT: 0.3 % (ref 0.0–3.0)
Basophils Absolute: 0 10*3/uL (ref 0.0–0.1)
Eosinophils Absolute: 0.2 10*3/uL (ref 0.0–0.7)
Eosinophils Relative: 3.8 % (ref 0.0–5.0)
HCT: 51.9 % (ref 39.0–52.0)
HEMOGLOBIN: 17.6 g/dL — AB (ref 13.0–17.0)
Lymphocytes Relative: 42 % (ref 12.0–46.0)
Lymphs Abs: 2.1 10*3/uL (ref 0.7–4.0)
MCHC: 33.9 g/dL (ref 30.0–36.0)
MCV: 94 fl (ref 78.0–100.0)
MONO ABS: 0.4 10*3/uL (ref 0.1–1.0)
MONOS PCT: 7.4 % (ref 3.0–12.0)
Neutro Abs: 2.3 10*3/uL (ref 1.4–7.7)
Neutrophils Relative %: 46.5 % (ref 43.0–77.0)
Platelets: 269 10*3/uL (ref 150.0–400.0)
RBC: 5.52 Mil/uL (ref 4.22–5.81)
RDW: 14.9 % (ref 11.5–15.5)
WBC: 5 10*3/uL (ref 4.0–10.5)

## 2016-10-21 LAB — COMPREHENSIVE METABOLIC PANEL
ALBUMIN: 4.2 g/dL (ref 3.5–5.2)
ALK PHOS: 63 U/L (ref 39–117)
ALT: 14 U/L (ref 0–53)
AST: 17 U/L (ref 0–37)
BUN: 10 mg/dL (ref 6–23)
CO2: 30 mEq/L (ref 19–32)
CREATININE: 0.92 mg/dL (ref 0.40–1.50)
Calcium: 9.7 mg/dL (ref 8.4–10.5)
Chloride: 102 mEq/L (ref 96–112)
GFR: 113.36 mL/min (ref >60.00)
Glucose, Bld: 96 mg/dL (ref 70–99)
Potassium: 4.2 mEq/L (ref 3.5–5.1)
SODIUM: 140 meq/L (ref 135–145)
TOTAL PROTEIN: 6.8 g/dL (ref 6.0–8.3)
Total Bilirubin: 0.8 mg/dL (ref 0.2–1.2)

## 2016-10-21 LAB — HEMOGLOBIN A1C: HEMOGLOBIN A1C: 7.4 % — AB (ref 4.6–6.5)

## 2016-10-21 MED ORDER — DICLOFENAC SODIUM 1 % TD GEL: 2.0000 g | Freq: Four times a day (QID) | TRANSDERMAL | 1 refills | Status: DC

## 2016-10-21 NOTE — Telephone Encounter (Signed)
Received PA request for Diclofenac. PA submitted & approved. Form faxed back to pharmacy. 

## 2016-10-21 NOTE — Progress Notes (Signed)
Pre visit review using our clinic review tool, if applicable. No additional management support is needed unless otherwise documented below in the visit note. 

## 2016-10-21 NOTE — Patient Instructions (Addendum)
Pain in knees, ankles bilaterally for over a year. Custom orthotics helped but have not resolved issue. Podiatry is considering a bone graft surgery- patient is very hesitant about this. He would like to get the knee and ankle pain evaluated by another provider. We agreed to sports medicine referral. Will trial voltaren gel if affordable. He uses aleve at night but we want to minimize use of oral nsaids with diabetes, polycythemia and prothrombotic risks. Update a1c in case steroids are considered. Does have some lateral hip pain as well and wonder about a more chronic bursitis- though he states switches from side to side- wonder if this could be muscular instead. Update labs as below  He also has upcoming diabetes follow up with Dr. Cruzita Lederer. Finally, we discussed referral to hematology again which he declines due to polycythemia. Encouraged quitting smoking and if hemoglobin over 18 would do therapeutic phlebotomy.   Would schedule physical in next 3 months or so

## 2016-10-21 NOTE — Progress Notes (Signed)
Subjective:  Gabriel Hamilton is a 46 y.o. year old very pleasant male patient who presents for/with See problem oriented charting ROS- no fever, chills, joint swelling. No chest pain. No low blood sugars.    Past Medical History-  Patient Active Problem List   Diagnosis Date Noted  . Tobacco abuse 12/22/2011    Priority: High  . Uncontrolled diabetes mellitus type 2 without complications (Gabriel Hamilton) 29/92/4268    Priority: High  . Hypertriglyceridemia 06/06/2014    Priority: Medium  . Polycythemia 08/26/2011    Priority: Medium  . Essential hypertension 07/10/2009    Priority: Medium  . Ingrown nail 07/24/2015    Priority: Low  . 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 Metatarsal deformity 05/08/2015    Priority: Low  . Equinus deformity of foot, acquired 05/08/2015    Priority: Low  . Onychomycosis 12/26/2014    Priority: Low  . Tachycardia 02/14/2014    Priority: Low  . Acne     Priority: Low  . HYPOGONADISM 11/21/2009    Priority: Low  . GERD 03/27/2008    Priority: Low  . HEART MURMUR 03/27/2008    Priority: Low    Medications- reviewed and updated Current Outpatient Prescriptions  Medication Sig Dispense Refill  . BAYER MICROLET LANCETS lancets Use as instructed to check sugar twice daily 200 each 5  . Blood Glucose Monitoring Suppl (BAYER CONTOUR NEXT MONITOR) w/Device KIT Use to check sugar twice daily 1 kit 0  . canagliflozin (INVOKANA) 100 MG TABS tablet Take 1 tablet (100 mg total) by mouth every morning. 30 tablet 11  . diclofenac sodium (VOLTAREN) 1 % GEL Apply 2 g topically 4 (four) times daily. 100 g 1  . glimepiride (AMARYL) 4 MG tablet TAKE ONE TABLET BY MOUTH ONE TIME DAILY BEFORE BREAKFAST 30 tablet 9  . glucose blood (BAYER CONTOUR NEXT TEST) test strip Use as instructed to check sugar twice daily 200 each 5  . metFORMIN (GLUCOPHAGE) 1000 MG tablet TAKE ONE TABLET TWICE DAILY WITH MEALS 60 tablet 11  . naproxen sodium (ANAPROX) 220  MG tablet USING FOR JOINT PAIN    . omeprazole (PRILOSEC) 20 MG capsule Take 20 mg by mouth daily.     No current facility-administered medications for this visit.     Objective: BP 114/72 (BP Location: Left Arm, Patient Position: Sitting, Cuff Size: Large)   Pulse 92   Temp 98.5 F (36.9 C) (Oral)   Ht 5' 8.5" (1.74 m)   Wt 186 lb 3.2 oz (84.5 kg)   SpO2 97%   BMI 27.90 kg/m  Gen: NAD, resting comfortably CV: RRR no murmurs rubs or gallops Lungs: CTAB no crackles, wheeze, rhonchi Ext: no edema Skin: warm, dry  Knee: Normal to inspection with no erythema or effusion or obvious bony abnormalities. Palpation normal with no warmth, patellar tenderness. joint line tenderness on lateral portoins of both knees.  ROM full in flexion and extension and lower leg rotation. Ligaments with solid consistent endpoints including ACL, PCL, LCL, MCL. Negative Mcmurray's, Apley's, and Thessalonian tests. Patellar and quadriceps tendons unremarkable. Hamstring and quadriceps strength is normal.   Bilateral ankles- good range of motion- anterior portion of medial malleolus bilaterally tender.   Some pain on palpation over right greater trochanter  Assessment/Plan:  Uncontrolled type 2 diabetes mellitus without complication, without long-term current use of insulin (Gabriel Hamilton) - Plan: CBC with Differential/Platelet, Comprehensive metabolic panel, Hemoglobin A1c  Chronic pain of both knees -  Plan: Ambulatory referral to Sports Medicine  Chronic pain of both ankles - Plan: Ambulatory referral to Sports Medicine S: Consistent pain in both knees, ankles, and right lateral hip (almost feels bruised). He was placed in custom orthotics by Dr. Caffie Hamilton last year and seemed to help the issues some. Does not wake him up at night but does tend to still hurt even when he gets off his feet and lays down. Actually feels better when up and moving on the legs. Occasional stiffness but no prolonged morning stiffness.  Pain in areas fluctuates from 4-8. Prior to orthotics had pain up to 10. Pain at least since last February- 2017.  A/P: Pain in knees, ankles bilaterally for over a year. Has some stiffness and better with movement but no prolonged morning stiffness- doubt rheumatological issue. Custom orthotics helped but have not resolved issue. Podiatry is considering a bone graft surgery- patient is very hesitant about this. He would like to get the knee and ankle pain evaluated by another provider. We agreed to sports medicine referral. Will trial voltaren gel if affordable. He uses aleve at night but we want to minimize use of oral nsaids with diabetes, polycythemia and prothrombotic risks. Update a1c in case steroids are considered. Does have some lateral hip pain as well and wonder about a more chronic bursitis- though he states switches from side to side- wonder if this could be muscular instead. Update labs as below  He also has upcoming diabetes follow up with Dr. Cruzita Hamilton. Finally, we discussed referral to hematology again which he declines due to polycythemia. Encouraged quitting smoking and if hemoglobin over 18 would do therapeutic phlebotomy.   Would schedule physical in next 3 months or so  Orders Placed This Encounter  Procedures  . CBC with Differential/Platelet  . Comprehensive metabolic panel    Gabriel Hamilton  . Hemoglobin A1c    Gabriel Hamilton  . Ambulatory referral to Sports Medicine    Referral Priority:   Routine    Referral Type:   Consultation    Referred to Provider:   Gerda Diss, DO    Number of Visits Requested:   1    Meds ordered this encounter  Medications  . diclofenac sodium (VOLTAREN) 1 % GEL    Sig: Apply 2 g topically 4 (four) times daily.    Dispense:  100 g    Refill:  1    Return precautions advised.  Gabriel Reddish, MD

## 2016-10-29 ENCOUNTER — Ambulatory Visit (HOSPITAL_COMMUNITY)
Admission: EM | Admit: 2016-10-29 | Discharge: 2016-10-29 | Disposition: A | Discharge status: Home / Self Care | Payer: BLUE CROSS/BLUE SHIELD | Attending: Internal Medicine | Admitting: Internal Medicine

## 2016-10-29 ENCOUNTER — Encounter (HOSPITAL_COMMUNITY): Payer: Self-pay | Admitting: Emergency Medicine

## 2016-10-29 DIAGNOSIS — R1013 Epigastric pain: Secondary | ICD-10-CM

## 2016-10-29 MED ORDER — DICYCLOMINE HCL 20 MG PO TABS: 20.0000 mg | ORAL_TABLET | Freq: Two times a day (BID) | ORAL | 0 refills | Status: DC

## 2016-10-29 NOTE — Medical Student Note (Signed)
[facility] Statistician Note For educational purposes for Medical, PA and NP students only and not part of the legal medical record.   CSN: [id number] Arrival date & time: 10/29/16  1003     History   Chief Complaint Chief Complaint  Patient presents with  . Abdominal Pain    HPI Gabriel Hamilton is a 46 y.o. male.He presents today with abd pain x 1 week that has been intermittent. sts that the pain has been generalized to the upper abdomen but also centralizes around umbilicus. Describes the pain as cramping and related to eating. sts he has had diarrhea all week and a few episodes of dry heaving with loss of appetite. sts he has felt fatigued and sweaty at times. Pt takes Prilosec every day for acid reflux. Denies any urinary problems, constipation or blood in stool. [place] Gauss any recent travels.    Abdominal Pain  Associated symptoms: diarrhea, fatigue, nausea and vomiting   Associated symptoms: no chest pain, no constipation, no cough, no dysuria, no fever and no shortness of breath     Past Medical History:  Diagnosis Date  . Acne   . Diabetes mellitus   . GERD (gastroesophageal reflux disease)   . Heart murmur   . Hypertension     Patient Active Problem List   Diagnosis Date Noted  . Ingrown nail 07/24/2015  . 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 Equinus deformity of foot, acquired 05/08/2015  . Onychomycosis 12/26/2014  . Hypertriglyceridemia 06/06/2014  . Tachycardia 02/14/2014  . Acne   . Tobacco abuse 12/22/2011  . Polycythemia 08/26/2011  . HYPOGONADISM 11/21/2009  . Essential hypertension 07/10/2009  . Uncontrolled diabetes mellitus type 2 without complications ([place]) 32/54/9826  . GERD 03/27/2008  . HEART MURMUR 03/27/2008    History reviewed. No pertinent surgical history.     Home Medications    Prior to Admission medications   Medication Sig Start Date End Date Taking? Authorizing  Provider  canagliflozin (INVOKANA) 100 MG TABS tablet Take 1 tablet (100 mg total) by mouth every morning. 03/18/16  Yes Philemon Kingdom, MD  glimepiride (AMARYL) 4 MG tablet TAKE ONE TABLET BY MOUTH ONE TIME DAILY BEFORE BREAKFAST 08/26/16  Yes Marin Olp, MD  metFORMIN (GLUCOPHAGE) 1000 MG tablet TAKE ONE TABLET TWICE DAILY WITH MEALS 03/18/16  Yes Philemon Kingdom, MD  omeprazole (PRILOSEC) 20 MG capsule Take 20 mg by mouth daily.   Yes [provider]  OVER THE COUNTER MEDICATION On an antibiotic for skin-long term   Yes [provider]  BAYER MICROLET LANCETS lancets Use as instructed to check sugar twice daily 08/12/16   Philemon Kingdom, MD  Blood Glucose Monitoring Suppl (BAYER CONTOUR NEXT MONITOR) w/Device KIT Use to check sugar twice daily 08/12/16   Philemon Kingdom, MD  diclofenac sodium (VOLTAREN) 1 % GEL Apply 2 g topically 4 (four) times daily. 10/21/16   Marin Olp, MD  glucose blood (BAYER CONTOUR NEXT TEST) test strip Use as instructed to check sugar twice daily 08/12/16   Philemon Kingdom, MD  naproxen sodium (ANAPROX) 220 MG tablet USING FOR JOINT PAIN    [provider]    Family History Family History  Problem Relation Age of Onset  . Diabetes Father   . Heart disease Father   . Hyperlipidemia Father   . Hypertension Father   . Arthritis Mother   . Cancer Mother  optic nerve    Social History Social History  Substance Use Topics  . Smoking status: Current Every Day Smoker    Packs/day: 0.50    Years: 22.00    Types: Cigarettes  . Smokeless tobacco: Never Used  . Alcohol use 8.4 oz/week    14 Standard drinks or equivalent per week     Allergies   Benazepril   Review of Systems Review of Systems  Constitutional: Positive for diaphoresis and fatigue. Negative for fever.  HENT: Negative.   Eyes: Negative.   Respiratory: Negative for cough and shortness of breath.   Cardiovascular: Negative for chest pain.    Gastrointestinal: Positive for abdominal pain, diarrhea, nausea and vomiting. Negative for blood in stool and constipation.  Endocrine: Negative.   Genitourinary: Negative for difficulty urinating, dysuria and flank pain.  Musculoskeletal: Positive for arthralgias.  Skin: Negative.   Allergic/Immunologic: Negative.   Neurological: Negative.   Hematological: Negative.   Psychiatric/Behavioral: Negative.      Physical Exam Updated Vital Signs BP 116/72 (BP Location: Right Arm)   Pulse 67   Temp 98.7 F (37.1 C) (Oral)   Resp 18   SpO2 100%   Physical Exam  Constitutional: He is oriented to person, place, and time. He appears well-developed and well-nourished.  HENT:  Head: Normocephalic and atraumatic.  Right Ear: External ear normal.  Left Ear: External ear normal.  Nose: Nose normal.  Mouth/Throat: Oropharynx is clear and moist.  Eyes: EOM are normal. Pupils are equal, round, and reactive to light.  Neck: Normal range of motion. Neck supple.  Cardiovascular: Normal rate, regular rhythm and normal heart sounds.   Pulmonary/Chest: Effort normal and breath sounds normal.  Abdominal: Soft. Bowel sounds are normal. There is tenderness (pt tender to palpation around umbilicus. abdomen soft, no masses felt. negative rebound tenderness. negative Murphey's sign).  Musculoskeletal: Normal range of motion.  Neurological: He is alert and oriented to person, place, and time.  Skin: Skin is warm and dry. Capillary refill takes less than 2 seconds.  Psychiatric: He has a normal mood and affect.     ED Treatments / Results  Labs (all labs ordered are listed, but only abnormal results are displayed) Labs Reviewed - No data to display  EKG  EKG Interpretation None       Radiology No results found.  Procedures Procedures (including critical care time)  Medications Ordered in ED Medications - No data to display   Initial Impression / Assessment and Plan / ED Course  I  have reviewed the triage vital signs and the nursing notes.  Pertinent labs & imaging results that were available during my care of the patient were reviewed by me and considered in my medical decision making (see chart for details).       Final Clinical Impressions(s) / ED Diagnoses   Final diagnoses:  None    New Prescriptions New Prescriptions   No medications on file

## 2016-10-29 NOTE — ED Triage Notes (Signed)
Abdominal cramping for a week , intermittently.  A vomiting episode this morning.  chills

## 2016-10-29 NOTE — Medical Student Note (Signed)
[facility] Statistician Note For educational purposes for Medical, PA and NP students only and not part of the legal medical record.   CSN: [id number] Arrival date & time: 10/29/16  1003     History   Chief Complaint Chief Complaint  Patient presents with  . Abdominal Pain    HPI Gabriel Hamilton is a 46 y.o. male.  HPI  Past Medical History:  Diagnosis Date  . Acne   . Diabetes mellitus   . GERD (gastroesophageal reflux disease)   . Heart murmur   . Hypertension     Patient Active Problem List   Diagnosis Date Noted  . Ingrown nail 07/24/2015  . 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 Equinus deformity of foot, acquired 05/08/2015  . Onychomycosis 12/26/2014  . Hypertriglyceridemia 06/06/2014  . Tachycardia 02/14/2014  . Acne   . Tobacco abuse 12/22/2011  . Polycythemia 08/26/2011  . HYPOGONADISM 11/21/2009  . Essential hypertension 07/10/2009  . Uncontrolled diabetes mellitus type 2 without complications ([place]) 38/25/0539  . GERD 03/27/2008  . HEART MURMUR 03/27/2008    History reviewed. No pertinent surgical history.     Home Medications    Prior to Admission medications   Medication Sig Start Date End Date Taking? Authorizing Provider  canagliflozin (INVOKANA) 100 MG TABS tablet Take 1 tablet (100 mg total) by mouth every morning. 03/18/16  Yes Philemon Kingdom, MD  glimepiride (AMARYL) 4 MG tablet TAKE ONE TABLET BY MOUTH ONE TIME DAILY BEFORE BREAKFAST 08/26/16  Yes Marin Olp, MD  metFORMIN (GLUCOPHAGE) 1000 MG tablet TAKE ONE TABLET TWICE DAILY WITH MEALS 03/18/16  Yes Philemon Kingdom, MD  omeprazole (PRILOSEC) 20 MG capsule Take 20 mg by mouth daily.   Yes [provider]  OVER THE COUNTER MEDICATION On an antibiotic for skin-long term   Yes [provider]  BAYER MICROLET LANCETS lancets Use as instructed to check sugar twice daily 08/12/16   Philemon Kingdom, MD   Blood Glucose Monitoring Suppl (BAYER CONTOUR NEXT MONITOR) w/Device KIT Use to check sugar twice daily 08/12/16   Philemon Kingdom, MD  diclofenac sodium (VOLTAREN) 1 % GEL Apply 2 g topically 4 (four) times daily. 10/21/16   Marin Olp, MD  dicyclomine (BENTYL) 20 MG tablet Take 1 tablet (20 mg total) by mouth 2 (two) times daily. 10/29/16   Barnet Glasgow, NP  glucose blood (BAYER CONTOUR NEXT TEST) test strip Use as instructed to check sugar twice daily 08/12/16   Philemon Kingdom, MD  naproxen sodium (ANAPROX) 220 MG tablet USING FOR JOINT PAIN    [provider]    Family History Family History  Problem Relation Age of Onset  . Diabetes Father   . Heart disease Father   . Hyperlipidemia Father   . Hypertension Father   . Arthritis Mother   . Cancer Mother        optic nerve    Social History Social History  Substance Use Topics  . Smoking status: Current Every Day Smoker    Packs/day: 0.50    Years: 22.00    Types: Cigarettes  . Smokeless tobacco: Never Used  . Alcohol use 8.4 oz/week    14 Standard drinks or equivalent per week     Allergies   Benazepril   Review of Systems Review of Systems   Physical Exam Updated Vital Signs BP 116/72 (BP Location: Right Arm)   Pulse 67   Temp 98.7  F (37.1 C) (Oral)   Resp 18   SpO2 100%   Physical Exam   ED Treatments / Results  Labs (all labs ordered are listed, but only abnormal results are displayed) Labs Reviewed - No data to display  EKG  EKG Interpretation None       Radiology No results found.  Procedures Procedures (including critical care time)  Medications Ordered in ED Medications - No data to display   Initial Impression / Assessment and Plan / ED Course  Pt is a 46 year old male that presents to the [facility] today with abd pain. Cramping and specifically related to eating. Negative rebound, and Murphys. Acute abd not present. Pt appears well and in no distress. Pt likely  viral gastroenteritis. Other likely diagnosis could be GERD but pt has been taking Prilosec every day.   Final Clinical Impressions(s) / ED Diagnoses   Final diagnoses:  Epigastric pain  Viral Gastroenteritis  New Prescriptions New Prescriptions   DICYCLOMINE (BENTYL) 20 MG TABLET    Take 1 tablet (20 mg total) by mouth 2 (two) times daily.

## 2016-10-29 NOTE — Discharge Instructions (Signed)
I recommend fluids, clear liquid diet such as beef broth, chicken broth, vegetable broth, slowly reintroduce solid foods such as bananas, rice, toast, etc. For abdominal cramping, I have prescribed Bentyl, take 1 tablet twice a day as needed. If symptoms persist past one week, follow up with your primary care provider or return to clinic.

## 2016-10-29 NOTE — ED Provider Notes (Signed)
CSN: [id number]     Arrival date & time 10/29/16  1003 History   None    Chief Complaint  Patient presents with  . Abdominal Pain   (Consider location/radiation/quality/duration/timing/severity/associated sxs/prior Treatment) The history is provided by the patient.  Abdominal Pain  Pain location:  Epigastric and periumbilical Pain quality: aching and throbbing   Pain radiates to:  Does not radiate Pain severity:  Moderate Onset quality:  Gradual Duration:  1 week Timing:  Intermittent Progression:  Worsening Chronicity:  New Context: eating   Context: not awakening from sleep, not sick contacts and not suspicious food intake   Relieved by:  Nothing Exacerbated by: eating. Ineffective treatments: prilosec. Associated symptoms: diarrhea and fatigue   Associated symptoms: no chills, no fever, no nausea and no vomiting     Past Medical History:  Diagnosis Date  . Acne   . Diabetes mellitus   . GERD (gastroesophageal reflux disease)   . Heart murmur   . Hypertension    History reviewed. No pertinent surgical history. Family History  Problem Relation Age of Onset  . Diabetes Father   . Heart disease Father   . Hyperlipidemia Father   . Hypertension Father   . Arthritis Mother   . Cancer Mother        optic nerve   Social History  Substance Use Topics  . Smoking status: Current Every Day Smoker    Packs/day: 0.50    Years: 22.00    Types: Cigarettes  . Smokeless tobacco: Never Used  . Alcohol use 8.4 oz/week    14 Standard drinks or equivalent per week    Review of Systems  Constitutional: Positive for fatigue. Negative for chills and fever.  HENT: Negative.   Respiratory: Negative.   Cardiovascular: Negative.   Gastrointestinal: Positive for abdominal pain and diarrhea. Negative for nausea and vomiting.  Musculoskeletal: Negative.   Skin: Negative.   Neurological: Negative.     Allergies  Benazepril  Home Medications   Prior to Admission medications    Medication Sig Start Date End Date Taking? Authorizing Provider  canagliflozin (INVOKANA) 100 MG TABS tablet Take 1 tablet (100 mg total) by mouth every morning. 03/18/16  Yes Philemon Kingdom, MD  glimepiride (AMARYL) 4 MG tablet TAKE ONE TABLET BY MOUTH ONE TIME DAILY BEFORE BREAKFAST 08/26/16  Yes Marin Olp, MD  metFORMIN (GLUCOPHAGE) 1000 MG tablet TAKE ONE TABLET TWICE DAILY WITH MEALS 03/18/16  Yes Philemon Kingdom, MD  omeprazole (PRILOSEC) 20 MG capsule Take 20 mg by mouth daily.   Yes [provider]  OVER THE COUNTER MEDICATION On an antibiotic for skin-long term   Yes [provider]  BAYER MICROLET LANCETS lancets Use as instructed to check sugar twice daily 08/12/16   Philemon Kingdom, MD  Blood Glucose Monitoring Suppl (BAYER CONTOUR NEXT MONITOR) w/Device KIT Use to check sugar twice daily 08/12/16   Philemon Kingdom, MD  diclofenac sodium (VOLTAREN) 1 % GEL Apply 2 g topically 4 (four) times daily. 10/21/16   Marin Olp, MD  dicyclomine (BENTYL) 20 MG tablet Take 1 tablet (20 mg total) by mouth 2 (two) times daily. 10/29/16   Barnet Glasgow, NP  glucose blood (BAYER CONTOUR NEXT TEST) test strip Use as instructed to check sugar twice daily 08/12/16   Philemon Kingdom, MD  naproxen sodium (ANAPROX) 220 MG tablet USING FOR JOINT PAIN    [provider]   Meds Ordered and Administered this Visit  Medications - No data to  display  BP 116/72 (BP Location: Right Arm)   Pulse 67   Temp 98.7 F (37.1 C) (Oral)   Resp 18   SpO2 100%  No data found.   Physical Exam  Constitutional: He is oriented to person, place, and time. He appears well-developed and well-nourished.  HENT:  Head: Normocephalic.  Right Ear: External ear normal.  Left Ear: External ear normal.  Eyes: Conjunctivae are normal.  Cardiovascular: Normal rate and regular rhythm.   Pulmonary/Chest: Effort normal and breath sounds normal.  Abdominal: Normal appearance. He  exhibits no mass. Bowel sounds are decreased. There is no hepatosplenomegaly. There is tenderness in the periumbilical area. There is no rigidity, no rebound, no CVA tenderness and negative Murphy's sign.  Neurological: He is alert and oriented to person, place, and time.  Skin: Skin is warm and dry. Capillary refill takes less than 2 seconds. He is not diaphoretic.  Psychiatric: He has a normal mood and affect. His behavior is normal.  Nursing note and vitals reviewed.   Urgent Care Course     Procedures (including critical care time)  Labs Review Labs Reviewed - No data to display  Imaging Review No results found.        MDM   1. Epigastric pain    Most likely viral gastrointestinal illness. Recommend clear liquid diet as tolerated, and slowed reintroduction a simple foods such as bananas, rice, toast. Given Bentyl for abdominal cramping, continue Prilosec as prescribed by his primary care provider, symptoms persist past one week return to clinic, in the meantime worsening go to the ER.     Barnet Glasgow, NP 10/29/16 [phone]

## 2016-11-04 ENCOUNTER — Ambulatory Visit (INDEPENDENT_AMBULATORY_CARE_PROVIDER_SITE_OTHER): Payer: BLUE CROSS/BLUE SHIELD | Admitting: Internal Medicine

## 2016-11-04 ENCOUNTER — Encounter: Payer: Self-pay | Admitting: Internal Medicine

## 2016-11-04 ENCOUNTER — Ambulatory Visit: Payer: BLUE CROSS/BLUE SHIELD | Admitting: Sports Medicine

## 2016-11-04 VITALS — BP 118/78 | HR 79 | Wt 184.0 lb

## 2016-11-04 DIAGNOSIS — E1165 Type 2 diabetes mellitus with hyperglycemia: Secondary | ICD-10-CM | POA: Diagnosis not present

## 2016-11-04 MED ORDER — METFORMIN HCL ER 500 MG PO TB24: 2000.0000 mg | ORAL_TABLET | Freq: Every day | ORAL | 3 refills | Status: DC

## 2016-11-04 NOTE — Progress Notes (Signed)
Patient ID: Gabriel Hamilton, male   DOB: 04-03-1971, 46 y.o.   MRN: [id number]  HPI: Gabriel Hamilton is a 46 y.o.-year-old male, DM2,  dx 2007-8, non-insulin-dependent, uncontrolled, without long term complications. Last visit 8 months ago!  He started to improve diet, drinks more water.   Last hemoglobin A1c was: Lab Results  Component Value Date   HGBA1C 7.4 (H) 10/21/2016   HGBA1C 7.7 03/18/2016   HGBA1C 7.4 05/29/2015  12/18/2015: Calculated HbA1c from Fructosamine: 7.5%  At last visit we switched to: - Metformin 1000 mg 2x a day, with meals >> moved at dinnertime 02/2016 - but nausea >> now back to 1000 mg 2x a day - Amaryl 4 mg daily in am. - Invokana 100 mg daily in am. He also tried [employer] >> worked well. He also tried Onglyza in the past.   Pt checks his sugars 2x a day and they are: - am: 120-140, 180 (higher if eats dinner late) >> 131-171, 186 >> 140-160 - 2h after b'fast: n/c >> 101- 154 >> ~130 >> 115 >> n/c >> 147 >> n/c - before lunch: n/c >> 82-148 >> 120-130 >> n/c - 2h after lunch: n/c >> 95-155 >> 100-115 >> n/c >> 130s >> n/c - before dinner:68-128 >> 100-115 >> 95-108 >> 101-164, 177 >> 100-115 - 2h after dinner: n/c >> 129-171 >> n/c >> 130-168 >> 140s >> 150s >> 164 - bedtime: n/c >> 115-174, 207, 211 >> 130s >> see above >> n/c - nighttime: n/c No lows. Lowest sugar was 101 >> 100; he has hypoglycemia awareness at 70. Highest sugar was 186 >> 192.  Meter: AccuChek Connect  Pt's meals are: - Breakfast: granola bar +/- fruit; may skip it - Lunch: salad, may skip - Dinner: meat (chicken, sausage, steak), veggies, starch (potatoes, rice); stews - Snack: walnuts, trailmix Drinks water, G2.  He lost 30 pounds in the last years. His weight is now fluctuating. He continues to go to the gym twice a week.  - no CKD, last BUN/creatinine:  Lab Results  Component Value Date   BUN 10 10/21/2016   CREATININE 0.92 10/21/2016  On Avapro. - last set of  lipids: Lab Results  Component Value Date   CHOL 192 12/18/2015   HDL 51.10 12/18/2015   LDLCALC 74 12/05/2014   LDLDIRECT 102.0 12/18/2015   TRIG 290.0 (H) 12/18/2015   CHOLHDL 4 12/18/2015   - last eye exam was 04/2016 - Dr Gershon Crane. No DR..  - He denies numbness and tingling in his feet. Dr Caffie Pinto - podiatry.  ROS: Constitutional: no weight gain/no weight loss, no fatigue, + subjective hyperthermia, no subjective hypothermia Eyes: no blurry vision, no xerophthalmia ENT: no sore throat, no nodules palpated in throat, no dysphagia, no odynophagia, no hoarseness Cardiovascular: no CP/no SOB/no palpitations/no leg swelling Respiratory: no cough/no SOB/no wheezing Gastrointestinal: no N/no V/no D/no C/+ acid reflux Musculoskeletal: + muscle aches/+ joint aches Skin: no rashes, no hair loss Neurological: no tremors/no numbness/no tingling/no dizziness  I reviewed pt's medications, allergies, PMH, social hx, family hx, and changes were documented in the history of present illness. Otherwise, unchanged from my initial visit note. He is on Bentyl.  PE: BP 118/78 (BP Location: Left Arm, Patient Position: Sitting)   Pulse 79   Wt 184 lb (83.5 kg)   SpO2 97%   BMI 27.57 kg/m  Body mass index is 27.57 kg/m.  Wt Readings from Last 3 Encounters:  11/04/16 184 lb (83.5 kg)  10/21/16 186 lb 3.2 oz (84.5 kg)  04/01/16 184 lb 6.4 oz (83.6 kg)   Constitutional: overweight, in NAD Eyes: PERRLA, EOMI, no exophthalmos ENT: moist mucous membranes, no thyromegaly, no cervical lymphadenopathy Cardiovascular: RRR, No MRG Respiratory: CTA B Gastrointestinal: abdomen soft, NT, ND, BS+ Musculoskeletal: no deformities, strength intact in all 4 Skin: moist, warm, no rashes Neurological: no tremor with outstretched hands, DTR normal in all 4  ASSESSMENT: 1. DM2, non-insulin-dependent, uncontrolled, without Long term complications, but with hyperglycemia  PLAN:  1. Patient with long-standing,  uncontrolled diabetes, With improvement in his sugars after adding Invokana, but then worsening control last summer. He returns after a long absence and had a recent HbA1c which was slightly better, but still above target. His sugars in the morning are the highest and he could not tolerate the regular metformin 2000 mg at dinnertime because of nausea. At this visit, I suggested to switch to metformin extended-release and we tried to take the entire dose at dinnertime, since he tells me that he saw good results with this practice, right after he started. - I suggested to: Patient Instructions  Please stop regular metformin and start MetforminER  2000 mg at dinnertime.  Please continue: - Amaryl 4 mg daily in am. - Invokana 100 mg daily in am.  Check some sugars at bedtime.  Please come back for a follow-up appointment in 3 months.  - Reviewed last HbA1c from earlier this month, which was slightly better, at 7.4% - continue checking sugars at different times of the day - check 1-2x a day, rotating checks >> advised to check some sugars at bedtime or 2 hours after dinner - advised for yearly eye exams >> he is UTD - advised for flu shot >> he is UTD - Return to clinic in 3 mo with sugar log   Philemon Kingdom, MD PhD [facility] Endocrinology

## 2016-11-04 NOTE — Patient Instructions (Addendum)
Please stop regular metformin and start MetforminER  2000 mg at dinnertime.  Please continue: - Amaryl 4 mg daily in am. - Invokana 100 mg daily in am.  Check some sugars at bedtime.  Please come back for a follow-up appointment in 3 months.

## 2016-11-23 ENCOUNTER — Encounter: Payer: Self-pay | Admitting: Family Medicine

## 2016-11-25 ENCOUNTER — Ambulatory Visit: Payer: BLUE CROSS/BLUE SHIELD | Admitting: Sports Medicine

## 2016-12-02 ENCOUNTER — Ambulatory Visit: Payer: BLUE CROSS/BLUE SHIELD | Admitting: Sports Medicine

## 2016-12-16 ENCOUNTER — Encounter: Payer: Self-pay | Admitting: Sports Medicine

## 2016-12-16 ENCOUNTER — Ambulatory Visit (INDEPENDENT_AMBULATORY_CARE_PROVIDER_SITE_OTHER): Payer: BLUE CROSS/BLUE SHIELD

## 2016-12-16 ENCOUNTER — Ambulatory Visit (INDEPENDENT_AMBULATORY_CARE_PROVIDER_SITE_OTHER): Payer: BLUE CROSS/BLUE SHIELD | Admitting: Sports Medicine

## 2016-12-16 VITALS — BP 120/82 | HR 98 | Ht 68.5 in | Wt 182.6 lb

## 2016-12-16 DIAGNOSIS — G8929 Other chronic pain: Secondary | ICD-10-CM

## 2016-12-16 DIAGNOSIS — M25551 Pain in right hip: Secondary | ICD-10-CM

## 2016-12-16 DIAGNOSIS — M25571 Pain in right ankle and joints of right foot: Secondary | ICD-10-CM

## 2016-12-16 DIAGNOSIS — M25561 Pain in right knee: Secondary | ICD-10-CM

## 2016-12-16 DIAGNOSIS — M2140 Flat foot [pes planus] (acquired), unspecified foot: Secondary | ICD-10-CM

## 2016-12-16 DIAGNOSIS — M25562 Pain in left knee: Secondary | ICD-10-CM

## 2016-12-16 DIAGNOSIS — M25572 Pain in left ankle and joints of left foot: Secondary | ICD-10-CM

## 2016-12-16 DIAGNOSIS — M7742 Metatarsalgia, left foot: Secondary | ICD-10-CM

## 2016-12-16 DIAGNOSIS — M76829 Posterior tibial tendinitis, unspecified leg: Secondary | ICD-10-CM

## 2016-12-16 DIAGNOSIS — M1712 Unilateral primary osteoarthritis, left knee: Secondary | ICD-10-CM | POA: Diagnosis not present

## 2016-12-16 DIAGNOSIS — M545 Low back pain: Secondary | ICD-10-CM | POA: Diagnosis not present

## 2016-12-16 MED ORDER — DICLOFENAC SODIUM 2 % TD SOLN: 1.0000 "application " | Freq: Two times a day (BID) | TRANSDERMAL | 0 refills | Status: AC

## 2016-12-16 MED ORDER — DICLOFENAC SODIUM 2 % TD SOLN: 1.0000 "application " | Freq: Two times a day (BID) | TRANSDERMAL | 2 refills | Status: DC

## 2016-12-16 NOTE — Progress Notes (Signed)
OFFICE VISIT NOTE Gabriel Hamilton. Gabriel Hamilton, [place] [facility]  Gabriel Hamilton - 46 y.o. male MRN [id number]  Date of birth: 1970/10/12  Visit Date: 12/16/2016  PCP: Marin Olp, MD   Referred by: Marin Olp, MD  Burlene Arnt, CMA acting as scribe for Dr. Paulla Fore.  SUBJECTIVE:   Chief Complaint  Patient presents with  . bilateral knee pain  . bilateral ankle pain  . right lateral hip pain   HPI: As below and per problem based documentation when appropriate.  Pt presents today with complaint of pain in both knees, ankle, and right lateral hip. He had xray of bilateral feet 05/08/15, no recent xray of hip or knees.   Knee pain has been a chronic problem for several months.  No known injury or trauma to the knees. He doesn't do anything strenuous but does keep his house extremely cold.  The pain is described as radiating aching pain that comes and goes and is rated as 5/10 but can get up to 10/10 at times. Pain seems to localize around the knee cap.  Worsened with staying still for long periods of time. He reports a lot of popping in his knees when he first wakes up in the morning.  Improves with activity Therapies tried include : Pt was provided with custom orthotics by Dr Caffie Pinto last year and this seemed to help some with his pain. He was prescribed Voltaren gel by Dr. Yong Channel but has not tried using this.  Other associated symptoms include: Pain seems to radiate from the toes, to the ankles, to the knees and sometimes into the hip.   Bilateral ankle pain has also been chronic for several months. Pain has been more severe lately and pain seems to be greater in the left ankle. No known injury or trauma to the ankles.  Pain is described sharp stabbing pain that comes and goes. Pain seems to be worse about mid-day after being up on his feet. The pain is not as bad when he uses his arch supports. Pain is rated about 10/10  at its worst but 5/10 normally.   Right lateral hip pain started over the past 6 months. The pain seems to be worse when he laying on it. He denies low back pain. Massaging seems to help with the pain. Pain seems to come and go, it will last for a few days and then go away for a few days.   He has tried ice and/or heat on the ankles, knees, and hip but not on a regular basis. He takes Aleve for his aches and pain and gets minimal relief. He typically takes the Aleve at night.   No recent xray of the hip or knees.   Pt denies fever, chills. He does have occasional night sweats.    Review of Systems  Constitutional: Negative for chills and fever.  Respiratory: Negative for shortness of breath and wheezing.   Cardiovascular: Negative for chest pain, palpitations and leg swelling.  Musculoskeletal: Positive for joint pain. Negative for back pain and falls.  Neurological: Negative for dizziness, tingling and headaches.  Endo/Heme/Allergies: Does not bruise/bleed easily.    Otherwise per HPI.  HISTORY & PERTINENT PRIOR DATA:  No specialty comments available. He reports that he has been smoking Cigarettes.  He has a 11.00 pack-year smoking history. He has never used smokeless tobacco.   Recent Labs  03/18/16 1023 10/21/16 1056  HGBA1C 7.7  7.4*   Medications & Allergies reviewed per EMR Patient Active Problem List   Diagnosis Date Noted  . 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 Type 2 diabetes mellitus with hyperglycemia, without long-term current use of insulin ([place]) 12/18/2015  . Ingrown nail 07/24/2015  . 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 Equinus deformity of foot, acquired 05/08/2015  . Onychomycosis 12/26/2014  . Hypertriglyceridemia 06/06/2014  . Tachycardia 02/14/2014  . Acne   . Tobacco abuse 12/22/2011  . Polycythemia 08/26/2011  . HYPOGONADISM 11/21/2009  . Essential hypertension 07/10/2009  . GERD  03/27/2008  . HEART MURMUR 03/27/2008   Past Medical History:  Diagnosis Date  . Acne   . Diabetes mellitus   . GERD (gastroesophageal reflux disease)   . Heart murmur   . Hypertension    Family History  Problem Relation Age of Onset  . Diabetes Father   . Heart disease Father   . Hyperlipidemia Father   . Hypertension Father   . Arthritis Mother   . Cancer Mother        optic nerve   No past surgical history on file. Social History   Occupational History  . Not on file.   Social History Main Topics  . Smoking status: Current Every Day Smoker    Packs/day: 0.50    Years: 22.00    Types: Cigarettes  . Smokeless tobacco: Never Used  . Alcohol use 8.4 oz/week    14 Standard drinks or equivalent per week  . Drug use: No  . Sexual activity: Yes    OBJECTIVE:  VS:  HT:5' 8.5" (174 cm)   WT:182 lb 9.6 oz (82.8 kg)  BMI:27.4    BP:120/82  HR:98bpm  TEMP: ( )  RESP:98 % EXAM: Findings:  WDWN, NAD, Non-toxic appearing Alert & appropriately interactive Not depressed or anxious appearing No increased work of breathing. Pupils are equal. EOM intact without nystagmus No clubbing or cyanosis of the extremities appreciated No significant rashes/lesions/ulcerations overlying the examined area. DP & PT pulses 2+/4.  Trace pretibial edema. sensation intact to light touch in lower extremities.  Is markedly tight hamstrings with straight leg raise but no significant radicular symptoms.  He has good internal/external rotation of bilateral hips does have pain with terminal FADIR on the right, this is mild and no pain with logroll or Stinchfield testing. Bilateral knees overall well aligned without significant varus or valgus deformity does have VMO insufficiency.  He is ligamentously stable with varus valgus testing as well as anterior and posterior drawer.  He does have some pain with McMurray's bilaterally a slightly positive patellar grind bilaterally.  Left ankle: Overall  is a small amount of bossing across the dorsum of the foot as well as significant longitudinal arch breakdown.  He is able to toe off but has poor posterior tibialis.     Dg Lumbar Spine 2-3 Views  Result Date: 12/16/2016 CLINICAL DATA:  Low back tightness, no known injury, initial encounter EXAM: LUMBAR SPINE - 3 VIEW COMPARISON:  None. FINDINGS: Five lumbar type vertebral bodies are well visualized. Vertebral body height is well maintained. No anterolisthesis is seen. No soft tissue abnormality is noted. IMPRESSION: No acute abnormality seen. Electronically Signed   By: Inez Catalina M.D.   On: 12/16/2016 15:33   Dg Knee 1-2 Views Left  Result Date: 12/16/2016 CLINICAL DATA:  Knee pain common no known injury, initial encounter EXAM: LEFT KNEE - 2 VIEW COMPARISON:  None.  FINDINGS: Mild medial joint space narrowing is noted. No acute fracture or dislocation is seen. No soft tissue abnormality is noted. IMPRESSION: Mild degenerative change without acute abnormality. Electronically Signed   By: Inez Catalina M.D.   On: 12/16/2016 15:35   Dg Ankle Complete Left  Result Date: 12/16/2016 CLINICAL DATA:  Ankle pain, no known injury, initial encounter EXAM: LEFT ANKLE COMPLETE - 3+ VIEW COMPARISON:  None. FINDINGS: There is no evidence of fracture, dislocation, or joint effusion. There is no evidence of arthropathy or other focal bone abnormality. Soft tissues are unremarkable. IMPRESSION: No acute abnormality noted. Electronically Signed   By: Inez Catalina M.D.   On: 12/16/2016 15:34   ASSESSMENT & PLAN:     ICD-10-CM   1. Chronic pain of both knees M25.561 DG Knee 1-2 Views Left   M25.562 Diclofenac Sodium (PENNSAID) 2 % SOLN   G89.29   2. Chronic pain of both ankles M25.571 DG Ankle Complete Left   G89.29    M25.572   3. Pain of right hip joint M25.551 DG Lumbar Spine 2-3 Views  4. Posterior tibialis tendon insufficiency M21.40   5. Metatarsalgia of left foot M77.42     ================================================================= Posterior tibialis tendon insufficiency Patient has findings consistent with degenerative changes at the foot and ankle that are mild with mild symptoms of the knees as well.  Suspect this is coming from a fairly significant posterior tibialis insufficiency given the loss of longitudinal arch.  We will have him begin using HaPad longitudinal and transverse arch support.  Additionally will begin working on strengthening of the posterior tibialis tendons with patient her walking boot for him backwards.  We will plan to follow-up with him in 6 weeks to see how he improves.  ================================================================= Follow-up: Return in about 6 weeks (around 01/27/2017).    CMA/ATC served as Education administrator during this visit. History, Physical, and Plan performed by medical provider. Documentation and orders reviewed and attested to.      Teresa Coombs, [place] [facility] Physician

## 2017-01-09 DIAGNOSIS — M76829 Posterior tibial tendinitis, unspecified leg: Secondary | ICD-10-CM | POA: Insufficient documentation

## 2017-01-11 NOTE — Assessment & Plan Note (Signed)
Patient has findings consistent with degenerative changes at the foot and ankle that are mild with mild symptoms of the knees as well.  Suspect this is coming from a fairly significant posterior tibialis insufficiency given the loss of longitudinal arch.  We will have him begin using HaPad longitudinal and transverse arch support.  Additionally will begin working on strengthening of the posterior tibialis tendons with patient her walking boot for him backwards.  We will plan to follow-up with him in 6 weeks to see how he improves.

## 2017-01-27 ENCOUNTER — Ambulatory Visit: Payer: BLUE CROSS/BLUE SHIELD | Admitting: Sports Medicine

## 2017-02-10 ENCOUNTER — Ambulatory Visit: Payer: BLUE CROSS/BLUE SHIELD | Admitting: Sports Medicine

## 2017-02-17 ENCOUNTER — Ambulatory Visit: Payer: BLUE CROSS/BLUE SHIELD | Admitting: Internal Medicine

## 2017-04-11 ENCOUNTER — Other Ambulatory Visit: Payer: Self-pay | Admitting: Internal Medicine

## 2017-04-27 DIAGNOSIS — L700 Acne vulgaris: Secondary | ICD-10-CM | POA: Diagnosis not present

## 2017-05-12 DIAGNOSIS — H53021 Refractive amblyopia, right eye: Secondary | ICD-10-CM | POA: Diagnosis not present

## 2017-05-12 DIAGNOSIS — Z7984 Long term (current) use of oral hypoglycemic drugs: Secondary | ICD-10-CM | POA: Diagnosis not present

## 2017-05-12 DIAGNOSIS — H5211 Myopia, right eye: Secondary | ICD-10-CM | POA: Diagnosis not present

## 2017-05-12 DIAGNOSIS — H524 Presbyopia: Secondary | ICD-10-CM | POA: Diagnosis not present

## 2017-05-12 DIAGNOSIS — H52202 Unspecified astigmatism, left eye: Secondary | ICD-10-CM | POA: Diagnosis not present

## 2017-05-12 DIAGNOSIS — E113392 Type 2 diabetes mellitus with moderate nonproliferative diabetic retinopathy without macular edema, left eye: Secondary | ICD-10-CM | POA: Diagnosis not present

## 2017-05-12 LAB — HM DIABETES EYE EXAM

## 2017-05-18 ENCOUNTER — Encounter: Payer: Self-pay | Admitting: Family Medicine

## 2017-05-26 ENCOUNTER — Ambulatory Visit: Payer: BLUE CROSS/BLUE SHIELD | Admitting: Internal Medicine

## 2017-05-26 ENCOUNTER — Encounter: Payer: Self-pay | Admitting: Internal Medicine

## 2017-05-26 VITALS — BP 126/90 | HR 92 | Wt 186.8 lb

## 2017-05-26 DIAGNOSIS — E1165 Type 2 diabetes mellitus with hyperglycemia: Secondary | ICD-10-CM | POA: Diagnosis not present

## 2017-05-26 DIAGNOSIS — E785 Hyperlipidemia, unspecified: Secondary | ICD-10-CM

## 2017-05-26 LAB — POCT GLYCOSYLATED HEMOGLOBIN (HGB A1C): Hemoglobin A1C: 8.1

## 2017-05-26 MED ORDER — SITAGLIPTIN PHOSPHATE 100 MG PO TABS: 100.0000 mg | ORAL_TABLET | Freq: Every day | ORAL | 5 refills | Status: DC

## 2017-05-26 MED ORDER — GLIMEPIRIDE 4 MG PO TABS: ORAL_TABLET | ORAL | 11 refills | Status: DC

## 2017-05-26 NOTE — Addendum Note (Signed)
Addended by: Drucilla Schmidt on: 05/26/2017 10:18 AM   Modules accepted: Orders

## 2017-05-26 NOTE — Progress Notes (Signed)
Patient ID: Gabriel Hamilton, male   DOB: 08/02/1970, 46 y.o.   MRN: [id number]  HPI: Gabriel Hamilton is a 46 y.o.-year-old male, DM2,  dx 2007-8, non-insulin-dependent, uncontrolled, with long term complications ([place]). Last visit 7 months ago!  Last hemoglobin A1c was: Lab Results  Component Value Date   HGBA1C 7.4 (H) 10/21/2016   HGBA1C 7.7 03/18/2016   HGBA1C 7.4 05/29/2015  12/18/2015: Calculated HbA1c from Fructosamine: 7.5%  At last visit we switched to: - Metformin 1000 mg 2x a day, with meals >> moved at dinnertime 02/2016 - but nausea >> then back to 1000 mg 2x a day >> now Metformin ER 2000 mg with dinner - tolerates this a little better - Amaryl 4 mg daily in am. - Invokana 100 mg daily in am. He also tried [place] >> worked well. He also tried Onglyza in the past.   Pt checks his sugars 2x a day: - am: 131-171, 186 >> 140-160 >> 112-160, 172, 196 (higher in vacation) - 2h after b'fast: 115 >> n/c >> 147 >> n/c >> 176, 269 x1 - before lunch: 82-148 >> 120-130 >> n/c - 2h after lunch:  n/c >> 130s >> n/c >> 200 x1 - before dinner:101-164, 177 >> 100-115 >> 78, 108-163, 177, 223 - 2h after dinner: 140s >> 150s >> 164 >> 131-219 - bedtime: 130s >> see above >> n/c >> 132-173, 184 - nighttime: n/c Lowest sugar was 100 >> 78 in 01/2017; he has hypoglycemia awareness at 70. Highest sugar was 192 >> 269.  Meter: AccuChek Connect  Pt's meals are: - Breakfast: granola bar +/- fruit; may skip it - Lunch: salad, may skip - Dinner: meat (chicken, sausage, steak), veggies, starch (potatoes, rice); stews - Snack: walnuts, trailmix Drinks water, G2.  He lost 30 pounds in the last years. His weight is now fluctuating. He continues to go to the gym twice a week.  - No CKD, last BUN/creatinine:  Lab Results  Component Value Date   BUN 10 10/21/2016   CREATININE 0.92 10/21/2016  On Avapro. - +HL; last set of lipids: Lab Results  Component Value Date   CHOL 192 12/18/2015   HDL 51.10 12/18/2015   LDLCALC 74 12/05/2014   LDLDIRECT 102.0 12/18/2015   TRIG 290.0 (H) 12/18/2015   CHOLHDL 4 12/18/2015   - last eye exam was  04/2016 >> No DR. Dr. Gershon Crane..  - + numbness and tingling in his feet. Dr Caffie Pinto - podiatry.  ROS: Constitutional: no weight gain/no weight loss, + fatigue, no subjective hyperthermia, no subjective hypothermia Eyes: + blurry vision, no xerophthalmia ENT: no sore throat, no nodules palpated in throat, no dysphagia, no odynophagia, no hoarseness Cardiovascular: no CP/no SOB/no palpitations/no leg swelling Respiratory: no cough/no SOB/no wheezing Gastrointestinal: no N/no V/no D/no C/+ acid reflux Musculoskeletal: + muscle aches/+ joint aches Skin: no rashes, no hair loss Neurological: no tremors/+ numbness/+ tingling/no dizziness  I reviewed pt's medications, allergies, PMH, social hx, family hx, and changes were documented in the history of present illness. Otherwise, unchanged from my initial visit note.  PE: BP 126/90   Pulse 92   Wt 186 lb 12.8 oz (84.7 kg)   SpO2 99%   BMI 27.99 kg/m  Body mass index is 27.99 kg/m.  Wt Readings from Last 3 Encounters:  05/26/17 186 lb 12.8 oz (84.7 kg)  12/16/16 182 lb 9.6 oz (82.8 kg)  11/04/16 184 lb (83.5 kg)   Constitutional: overweight, in NAD Eyes: PERRLA, EOMI, no  exophthalmos ENT: moist mucous membranes, no thyromegaly, no cervical lymphadenopathy Cardiovascular: RRR, No MRG Respiratory: CTA B Gastrointestinal: abdomen soft, NT, ND, BS+ Musculoskeletal: no deformities, strength intact in all 4 Skin: moist, warm, no rashes Neurological: no tremor with outstretched hands, DTR normal in all 4  ASSESSMENT: 1. DM2, non-insulin-dependent, uncontrolled, without Long term complications, but with hyperglycemia - PN  2. HL  PLAN:  1. Patient with long-standing, uncontrolled, diabetes, with worse sugars since last visit due to hyperglycemic spikes.  He had periods in August and  September with better blood sugars on the same regimen, which tells me that his dietary habits are responsible for his most recent spikes.  We discussed about improving diet, which he is trying to do.  He is also trying to stay active. - For now, we will continue with the metformin extended release at dinnertime, Amaryl, and Invokana at the same doses, but will try to add a DPP 4 inhibitor to help with after meal sugars - I suggested to: Patient Instructions  Please continue: - Metformin ER 2000 mg at dinnertime - Amaryl 4 mg daily in am. - Invokana 100 mg daily in am.  Please add: - Januvia 100 mg in am, before b'fast  Please come back for a follow-up appointment in 3 months.  - today, HbA1c is 8.1% (higher) - continue checking sugars at different times of the day - check 1x a day, rotating checks - advised for yearly eye exams >> he is UTD - refuses flu shot - Return to clinic in 3 mo with sugar log   2. HL - reviewed latest Lipid panel: LDL close to goal, but triglycerides high - He is not on a statin - will have a new APE with Dr. Yong Channel in 2 weeks -will have a lipid panel checked then  Philemon Kingdom, MD PhD [facility] Endocrinology

## 2017-05-26 NOTE — Patient Instructions (Addendum)
Please continue: - Metformin ER 2000 mg at dinnertime - Amaryl 4 mg daily in am. - Invokana 100 mg daily in am.  Please add: - Januvia 100 mg in am, before b'fast  Please come back for a follow-up appointment in 3 months.

## 2017-06-09 ENCOUNTER — Encounter: Payer: Self-pay | Admitting: Family Medicine

## 2017-06-09 ENCOUNTER — Ambulatory Visit (INDEPENDENT_AMBULATORY_CARE_PROVIDER_SITE_OTHER): Payer: BLUE CROSS/BLUE SHIELD | Admitting: Family Medicine

## 2017-06-09 VITALS — BP 108/84 | HR 98 | Temp 98.3°F | Ht 68.25 in | Wt 189.2 lb

## 2017-06-09 DIAGNOSIS — E1165 Type 2 diabetes mellitus with hyperglycemia: Secondary | ICD-10-CM | POA: Diagnosis not present

## 2017-06-09 DIAGNOSIS — E781 Pure hyperglyceridemia: Secondary | ICD-10-CM | POA: Diagnosis not present

## 2017-06-09 DIAGNOSIS — Z79899 Other long term (current) drug therapy: Secondary | ICD-10-CM | POA: Diagnosis not present

## 2017-06-09 DIAGNOSIS — Z125 Encounter for screening for malignant neoplasm of prostate: Secondary | ICD-10-CM | POA: Diagnosis not present

## 2017-06-09 DIAGNOSIS — I10 Essential (primary) hypertension: Secondary | ICD-10-CM

## 2017-06-09 DIAGNOSIS — Z0000 Encounter for general adult medical examination without abnormal findings: Secondary | ICD-10-CM

## 2017-06-09 DIAGNOSIS — D751 Secondary polycythemia: Secondary | ICD-10-CM

## 2017-06-09 DIAGNOSIS — Z720 Tobacco use: Secondary | ICD-10-CM

## 2017-06-09 LAB — LIPID PANEL
Cholesterol: 176 mg/dL (ref 0–200)
HDL: 54.4 mg/dL (ref >39.00)
NonHDL: 121.91
Total CHOL/HDL Ratio: 3
Triglycerides: 204 mg/dL — ABNORMAL HIGH (ref 0.0–149.0)
VLDL: 40.8 mg/dL — AB (ref 0.0–40.0)

## 2017-06-09 LAB — COMPREHENSIVE METABOLIC PANEL
ALBUMIN: 4.5 g/dL (ref 3.5–5.2)
ALT: 11 U/L (ref 0–53)
AST: 16 U/L (ref 0–37)
Alkaline Phosphatase: 79 U/L (ref 39–117)
BUN: 13 mg/dL (ref 6–23)
CHLORIDE: 99 meq/L (ref 96–112)
CO2: 31 mEq/L (ref 19–32)
Calcium: 10.1 mg/dL (ref 8.4–10.5)
Creatinine, Ser: 0.82 mg/dL (ref 0.40–1.50)
GFR: 129.11 mL/min (ref >60.00)
GLUCOSE: 83 mg/dL (ref 70–99)
POTASSIUM: 4.7 meq/L (ref 3.5–5.1)
SODIUM: 139 meq/L (ref 135–145)
Total Bilirubin: 0.9 mg/dL (ref 0.2–1.2)
Total Protein: 7.5 g/dL (ref 6.0–8.3)

## 2017-06-09 LAB — MICROALBUMIN / CREATININE URINE RATIO
Creatinine,U: 85 mg/dL
MICROALB UR: 2.4 mg/dL — AB (ref 0.0–1.9)
Microalb Creat Ratio: 2.8 mg/g (ref 0.0–30.0)

## 2017-06-09 LAB — VITAMIN B12: VITAMIN B 12: 880 pg/mL (ref 211–911)

## 2017-06-09 LAB — LDL CHOLESTEROL, DIRECT: Direct LDL: 117 mg/dL

## 2017-06-09 LAB — PSA: PSA: 0.82 ng/mL (ref 0.10–4.00)

## 2017-06-09 LAB — CBC
HEMATOCRIT: 53.8 % — AB (ref 39.0–52.0)
Hemoglobin: 17.5 g/dL — ABNORMAL HIGH (ref 13.0–17.0)
MCHC: 32.5 g/dL (ref 30.0–36.0)
MCV: 92.4 fl (ref 78.0–100.0)
Platelets: 317 10*3/uL (ref 150.0–400.0)
RBC: 5.83 Mil/uL — ABNORMAL HIGH (ref 4.22–5.81)
RDW: 15.7 % — AB (ref 11.5–15.5)
WBC: 6.6 10*3/uL (ref 4.0–10.5)

## 2017-06-09 NOTE — Progress Notes (Addendum)
Phone: [phone]  Subjective:  Patient presents today for their annual physical. Chief complaint-noted.   See problem oriented charting- ROS- full  review of systems was completed and negative except for: sweating- rare at night perhaps once a night- has not check sugar- advised, eye itching, constipation, joint pain, muscle aches  The following were reviewed and entered/updated in epic: Past Medical History:  Diagnosis Date  . Acne   . Diabetes mellitus   . GERD (gastroesophageal reflux disease)   . Heart murmur   . Hypertension    Patient Active Problem List   Diagnosis Date Noted  . Tobacco abuse 12/22/2011    Priority: High  . Hypertriglyceridemia 06/06/2014    Priority: Medium  . Polycythemia 08/26/2011    Priority: Medium  . Essential hypertension 07/10/2009    Priority: Medium  . Ingrown nail 07/24/2015    Priority: Low  . 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 Metatarsal deformity 05/08/2015    Priority: Low  . Equinus deformity of foot, acquired 05/08/2015    Priority: Low  . Onychomycosis 12/26/2014    Priority: Low  . Tachycardia 02/14/2014    Priority: Low  . Acne     Priority: Low  . HYPOGONADISM 11/21/2009    Priority: Low  . GERD 03/27/2008    Priority: Low  . HEART MURMUR 03/27/2008    Priority: Low  . 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 Type 2 diabetes mellitus with hyperglycemia, without long-term current use of insulin ([place]) 12/18/2015   History reviewed. No pertinent surgical history.  Family History  Problem Relation Age of Onset  . Diabetes Father   . Heart disease Father   . Hyperlipidemia Father   . Hypertension Father   . Arthritis Mother   . Cancer Mother        optic nerve    Medications- reviewed and updated Current Outpatient Medications  Medication Sig Dispense Refill  . ampicillin (PRINCIPEN) 500 MG capsule TAKE ONE CAPSULE BY MOUTH TWICE DAILY WITH FOOD    . BAYER  MICROLET LANCETS lancets Use as instructed to check sugar twice daily 200 each 5  . Blood Glucose Monitoring Suppl (BAYER CONTOUR NEXT MONITOR) w/Device KIT Use to check sugar twice daily 1 kit 0  . Diclofenac Sodium (PENNSAID) 2 % SOLN Place 1 application onto the skin 2 (two) times daily. 112 g 2  . diclofenac sodium (VOLTAREN) 1 % GEL Apply 2 g topically 4 (four) times daily. 100 g 1  . dicyclomine (BENTYL) 20 MG tablet Take 1 tablet (20 mg total) by mouth 2 (two) times daily. 20 tablet 0  . glimepiride (AMARYL) 4 MG tablet TAKE ONE TABLET BY MOUTH ONE TIME DAILY BEFORE BREAKFAST 30 tablet 11  . glucose blood (BAYER CONTOUR NEXT TEST) test strip Use as instructed to check sugar twice daily 200 each 5  . INVOKANA 100 MG TABS tablet TAKE 1 TABLET BY MOUTH EVERY MORNING 30 tablet 7  . metFORMIN (GLUCOPHAGE-XR) 500 MG 24 hr tablet Take 4 tablets (2,000 mg total) by mouth daily with breakfast. 360 tablet 3  . naproxen sodium (ANAPROX) 220 MG tablet USING FOR JOINT PAIN    . omeprazole (PRILOSEC) 20 MG capsule Take 20 mg by mouth daily.    [name] OVER THE COUNTER MEDICATION On an antibiotic for skin-long term    . sitaGLIPtin (JANUVIA) 100 MG tablet Take 1 tablet (100 mg total) by mouth daily. 30 tablet 5  No current facility-administered medications for this visit.     Allergies-reviewed and updated Allergies  Allergen Reactions  . Benazepril     May have caused angioedema    Social History   Socioeconomic History  . Marital status: Married    Spouse name: None  . Number of children: None  . Years of education: None  . Highest education level: None  Social Needs  . Financial resource strain: None  . Food insecurity - worry: None  . Food insecurity - inability: None  . Transportation needs - medical: None  . Transportation needs - non-medical: None  Occupational History  . None  Tobacco Use  . Smoking status: Current Every Day Smoker    Packs/day: 0.50    Years: 22.00    Pack  years: 11.00    Types: Cigarettes  . Smokeless tobacco: Never Used  Substance and Sexual Activity  . Alcohol use: Yes    Alcohol/week: 8.4 oz    Types: 14 Standard drinks or equivalent per week  . Drug use: No  . Sexual activity: Yes  Other Topics Concern  . None  Social History Narrative   Married for 8 years in 2015. Wife with 2 sons from previous marriage. 3 grandsons. 1 niece. Close family.       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 Oropharynx normal Neck: no thyromegaly CV: RRR no murmurs rubs or gallops Lungs: CTAB no crackles, wheeze, rhonchi Abdomen: soft/nontender/nondistended/normal bowel sounds. No rebound or guarding.  Ext: no edema In left axilla has 2 x 2 cm fluctuant area without surrounding erythema- able to get it to drain light amount of purulent material Skin: warm, dry Neuro: grossly normal, moves all extremities, PERRLA Rectal: normal tone, normal sized prostate, no masses or tenderness   Diabetic Foot Exam - Simple   Simple Foot Form Diabetic Foot exam was performed with the following findings:  Yes 06/09/2017 11:48 AM  Visual Inspection No deformities, no ulcerations, no other skin breakdown bilaterally:  Yes Sensation Testing Intact to touch and monofilament testing bilaterally:  Yes Pulse Check Posterior Tibialis and Dorsalis pulse intact bilaterally:  Yes Comments Some scarring from prior nail procedures    Assessment/Plan:  46 y.o. male presenting for annual physical.  Health Maintenance counseling: 1. Anticipatory guidance: Patient  counseled regarding regular dental exams -q6 months, eye exams in November 2018, wearing seatbelts.  2. Risk factor reduction:  Advised patient of need for regular exercise and diet rich and fruits and vegetables to reduce risk of heart attack and stroke. Exercise- planning on doing MMA training for grappling- denies head impact with this. Diet-thinks mainly holiday overeating- knows he needs to tighten this up.  Wt Readings from Last 3 Encounters:  06/09/17 189 lb 3.2 oz (85.8 kg)  05/26/17 186 lb 12.8 oz (84.7 kg)  12/16/16 182 lb 9.6 oz (82.8 kg)  3. Immunizations/screenings/ancillary studies- declines flu shot Immunization History  Administered Date(s) Administered  . Pneumococcal Polysaccharide-23 06/06/2008, 02/14/2014  . Td 03/22/2008  4. Prostate cancer screening- low risk rectal exam. Update PSA  Lab Results  Component Value Date   PSA 0.66 06/26/2015   PSA 0.56 08/23/2013   PSA 0.51 07/15/2011  5. Colon cancer screening - no family history, start at age 60 6. Testicular cancer screening- advised monthly self exams - doing exams and no issues 7. STD screening- patient opts out- monogamous relatoinship  Status of chronic or acute concerns   Diabetes follows with Dr. Cruzita Lederer. On invokana, metformin, januvia, amaryl. Recent a1c 8.1- has had some adjustments made to medications  Hypertriglyceridemia discussed likely to start mild statin if trig >200  GERD- on prilosec 52m. Does take b12- will check given 2 high riks meds  Tobacco abuse- 1/2 PPD. Advised cessation - one of his new years resolutions.  Secondary polycythemia as a result. Had been donating to [facility] regularly - got as low as 17. States he is going to start back  HTN- controlled without BP meds since being on invokana.   [place] for eye itching when out in yard in particular  Suspect abscess vs infected cyst left axilla- advised patient to use warm compresses as has only been a few days and is not worsening  plus able to get to drain some today in office. Gave return precautions of worsening size, pain, or new fever  Future Appointments  Date Time [place] 08/25/2017  9:30 AM GPhilemon Kingdom MD [facility] None   6 months  Not fully fasting- has had water with lemon in it but likely shouldn't affect labs too much Orders Placed This Encounter  Procedures  . Microalbumin / creatinine urine ratio    [place]  . CBC    [place]  . Comprehensive metabolic panel    [place]    Order Specific Question:   Has the patient fasted?    Answer:   No  . Vitamin B12  . Lipid panel    [place]    Order Specific Question:   Has the patient fasted?    Answer:   No  . PSA   Return precautions advised.   SGarret Reddish MD

## 2017-06-09 NOTE — Patient Instructions (Addendum)
No changes today other than getting back on the healthy eating, regular exercise path and working to get weight down 10 lbs over next 6 months  Also love your goal of quitting smoking! You can do this!

## 2017-06-10 ENCOUNTER — Telehealth: Payer: Self-pay | Admitting: Family Medicine

## 2017-06-10 NOTE — Telephone Encounter (Signed)
Patient received result notes 06/09/17 per Dr. Ansel Bong notes, verbalized understanding.

## 2017-07-22 ENCOUNTER — Ambulatory Visit: Payer: BLUE CROSS/BLUE SHIELD | Admitting: Family Medicine

## 2017-08-05 ENCOUNTER — Encounter: Payer: Self-pay | Admitting: Family Medicine

## 2017-08-05 ENCOUNTER — Ambulatory Visit: Payer: BLUE CROSS/BLUE SHIELD | Admitting: Family Medicine

## 2017-08-05 VITALS — BP 104/76 | HR 102 | Temp 98.3°F | Ht 68.25 in | Wt 186.8 lb

## 2017-08-05 DIAGNOSIS — J029 Acute pharyngitis, unspecified: Secondary | ICD-10-CM | POA: Diagnosis not present

## 2017-08-05 DIAGNOSIS — M25572 Pain in left ankle and joints of left foot: Secondary | ICD-10-CM | POA: Diagnosis not present

## 2017-08-05 DIAGNOSIS — K219 Gastro-esophageal reflux disease without esophagitis: Secondary | ICD-10-CM | POA: Diagnosis not present

## 2017-08-05 LAB — POCT RAPID STREP A (OFFICE): Rapid Strep A Screen: NEGATIVE

## 2017-08-05 MED ORDER — OMEPRAZOLE 40 MG PO CPDR: 40.0000 mg | DELAYED_RELEASE_CAPSULE | Freq: Every day | ORAL | 0 refills | Status: DC

## 2017-08-05 NOTE — Progress Notes (Signed)
Subjective:  Gabriel Hamilton is a 47 y.o. year old very pleasant male patient who presents for/with See problem oriented charting ROS- no chest pain of shortness of breath. No abdominal pain. No burning in chest.    Past Medical History-  Patient Active Problem List   Diagnosis Date Noted  . Type 2 diabetes mellitus with hyperglycemia, without long-term current use of insulin ([place]) 12/18/2015    Priority: High  . Tobacco abuse 12/22/2011    Priority: High  . Hypertriglyceridemia 06/06/2014    Priority: Medium  . Polycythemia 08/26/2011    Priority: Medium  . Essential hypertension 07/10/2009    Priority: Medium  . Ingrown nail 07/24/2015    Priority: Low  . 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 Metatarsal deformity 05/08/2015    Priority: Low  . Equinus deformity of foot, acquired 05/08/2015    Priority: Low  . Onychomycosis 12/26/2014    Priority: Low  . Tachycardia 02/14/2014    Priority: Low  . Acne     Priority: Low  . HYPOGONADISM 11/21/2009    Priority: Low  . GERD 03/27/2008    Priority: Low  . HEART MURMUR 03/27/2008    Priority: Low  . 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 ampicillin (PRINCIPEN) 500 MG capsule TAKE ONE CAPSULE BY MOUTH TWICE DAILY WITH FOOD    . BAYER MICROLET LANCETS lancets Use as instructed to check sugar twice daily 200 each 5  . Blood Glucose Monitoring Suppl (BAYER CONTOUR NEXT MONITOR) w/Device KIT Use to check sugar twice daily 1 kit 0  . Diclofenac Sodium (PENNSAID) 2 % SOLN Place 1 application onto the skin 2 (two) times daily. 112 g 2  . diclofenac sodium (VOLTAREN) 1 % GEL Apply 2 g topically 4 (four) times daily. 100 g 1  . dicyclomine (BENTYL) 20 MG tablet Take 1 tablet (20 mg total) by mouth 2 (two) times daily. 20 tablet 0  . glimepiride (AMARYL) 4 MG tablet TAKE ONE TABLET BY MOUTH ONE  TIME DAILY BEFORE BREAKFAST 30 tablet 11  . glucose blood (BAYER CONTOUR NEXT TEST) test strip Use as instructed to check sugar twice daily 200 each 5  . INVOKANA 100 MG TABS tablet TAKE 1 TABLET BY MOUTH EVERY MORNING 30 tablet 7  . metFORMIN (GLUCOPHAGE-XR) 500 MG 24 hr tablet Take 4 tablets (2,000 mg total) by mouth daily with breakfast. 360 tablet 3  . naproxen sodium (ANAPROX) 220 MG tablet USING FOR JOINT PAIN    . omeprazole (PRILOSEC) 40 MG capsule Take 1 capsule (40 mg total) by mouth daily. 30 capsule 0  . OVER THE COUNTER MEDICATION On an antibiotic for skin-long term    . sitaGLIPtin (JANUVIA) 100 MG tablet Take 1 tablet (100 mg total) by mouth daily. 30 tablet 5   No current facility-administered medications for this visit.     Objective: BP 104/76 (BP Location: Left Arm, Patient Position: Sitting, Cuff Size: Large)   Pulse (!) 102   Temp 98.3 F (36.8 C) (Oral)   Ht 5' 8.25" (1.734 m)   Wt 186 lb 12.8 oz (84.7 kg)   SpO2 96%   BMI 28.20 kg/m  Gen: NAD, resting comfortably, smells of smoke Oropharynx largely normal- minimal drainage. TM normal. Nares normal.  CV: RRR no murmurs rubs or gallops Lungs: CTAB no crackles, wheeze, rhonchi Abdomen: soft/nontender/nondistended Ext: no  edema Skin: warm, dry  Left Ankle: No visible erythema or swelling. Range of motion is full in all directions. Strength is 5/5 in all directions. No pain at base of 5th MT; No tenderness over cuboid; Mild tenderness over posterior aspects of lateral Able to walk 4 steps with ease  Results for orders placed or performed in visit on 08/05/17 (from the past 24 hour(s))  POCT rapid strep A     Status: None   Collection Time: 08/05/17 12:24 PM  Result Value Ref Range   Rapid Strep A Screen Negative Negative   Assessment/Plan:  GERD? Sore throat - Plan: POCT rapid strep A  Gastroesophageal reflux disease without esophagitis  Acute left ankle pain S: about a week or two of itchy and  sore throat intermittent. Mild cough doesn't feel like most of his common colds though. He can point to area of his throat externally of point of most pain  Seems to get worse when he eats poorly. Still taking his prilosec- has had some mild regurgitation of food. Has tried vegetarian diet and seems slightly better.  A/P: Rapid strep negative. Exam pretty reassuring as well. With regurgitated food despite prilosec 67m- discussed 1 month trial of omeprazole 481mthen go back down to 2085mIf he fails to improve or worsens would refer to ENT- his concern is throat cancer given long term smoking history- I have low suspicion for this today but would follow up if not improving  Left ankle pain S: 5 days ago left ankle pain - posterior to lateral malleolus. Was rather severe and hard to sleep. No very minimal unless pushes on. Soaking in epsom salts helps some. Seems to flare up when off his feet.  A/P: Much improved pain over last day or two. advised icing regimen per avs. Avoid nsaids given possible reflux symptoms. Advised follow up with Dr. RigPaulla Fore fails to improve  Future Appointments  Date Time Provider Dep[place]/11/2017  9:30 AM GhePhilemon KingdomD [facility] None  12/08/2017  9:00 AM HunMarin OlpD [facility]   Lab/Order associations: Sore throat - Plan: POCT rapid strep A  Meds ordered this encounter  Medications  . omeprazole (PRILOSEC) 40 MG capsule    Sig: Take 1 capsule (40 mg total) by mouth daily.    Dispense:  30 capsule    Refill:  0    Return precautions advised.  SteGarret ReddishD

## 2017-08-05 NOTE — Patient Instructions (Signed)
Strep test negative  Lets try prilosec 40mg  for the next month. If you fail to improve in this time frame or if symptoms worsen let us refer you to ear nose and throat under throat pain  Vegetarian diet may help as well.   The best thing you can do for your health is to quit smoking!

## 2017-08-19 IMAGING — DX DG KNEE 1-2V*L*
2 series · 2 of 2 positions shown · non-contrast
Comparison: None.

CLINICAL DATA: Knee pain common no known injury, initial encounter

EXAM:
LEFT KNEE - 2 VIEW

[knee ap]
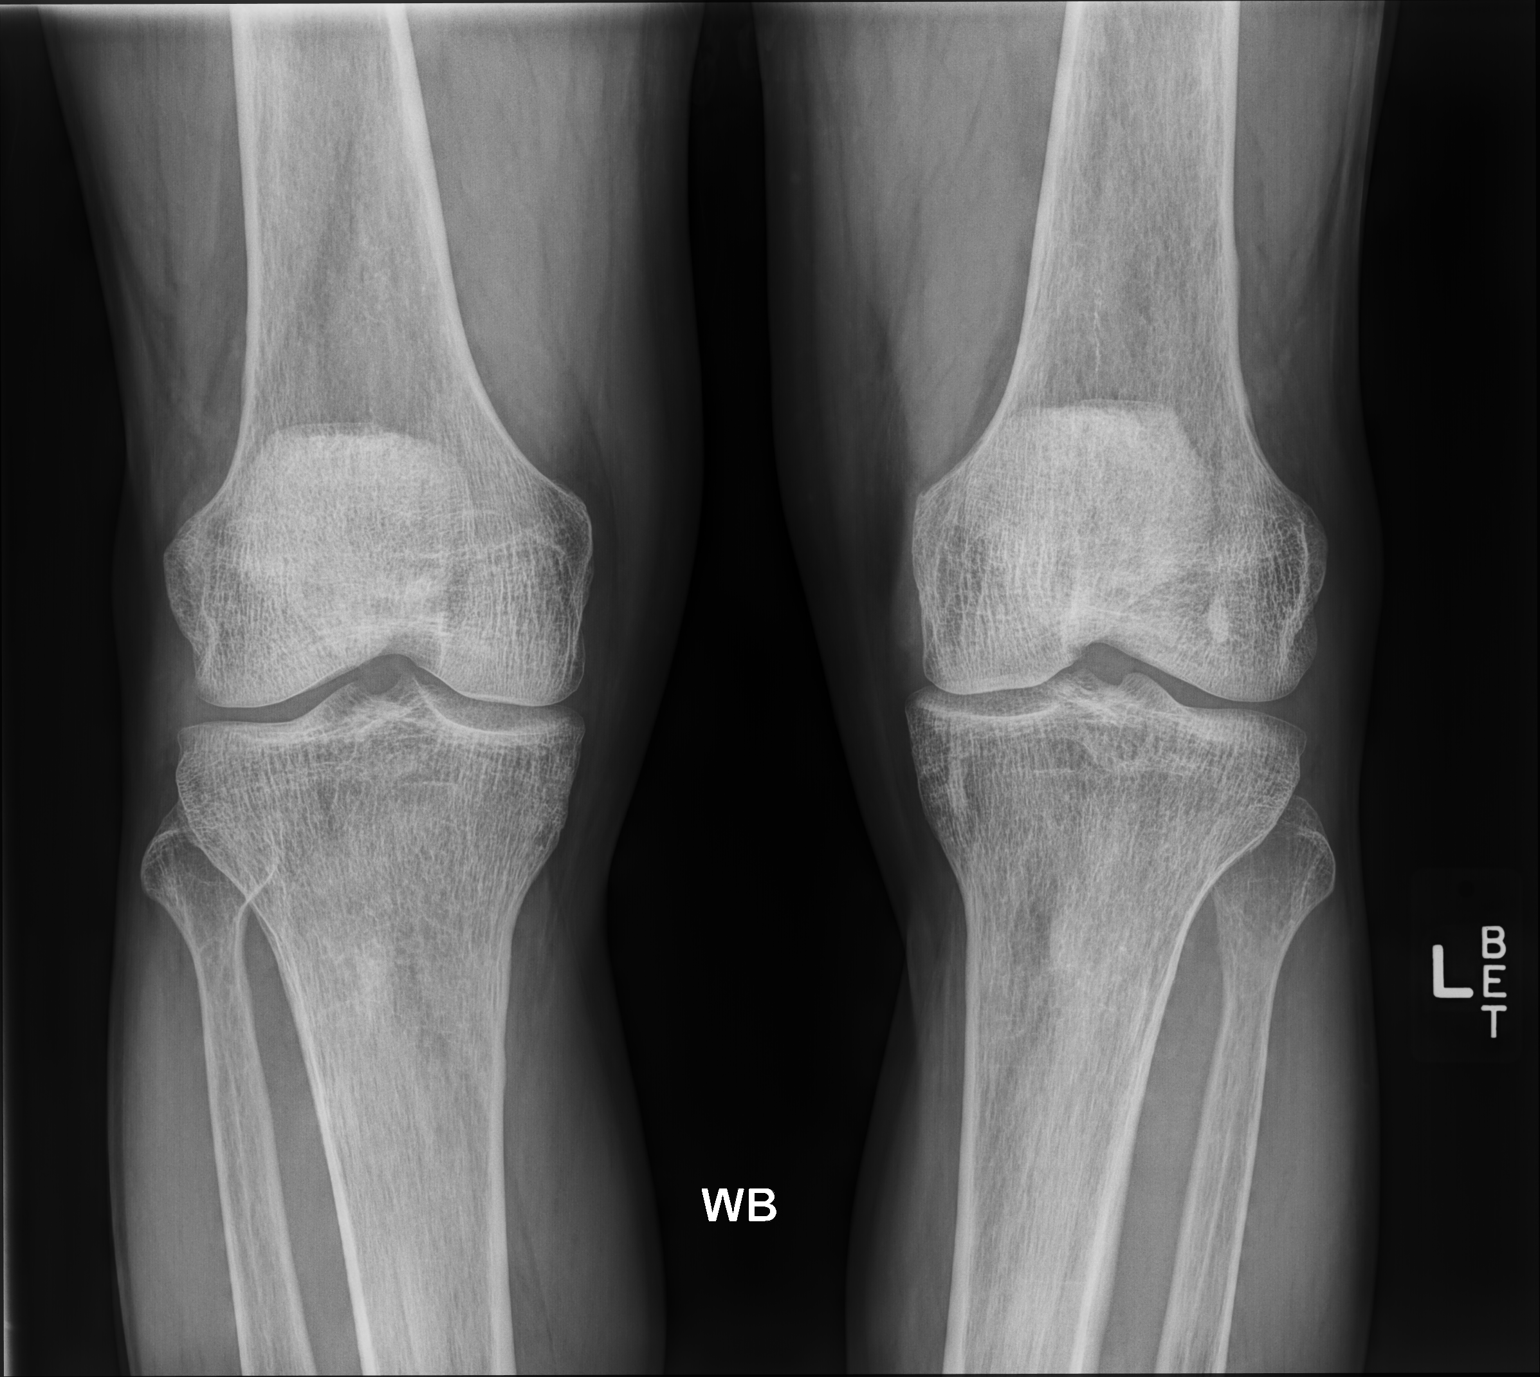

[knee lat]
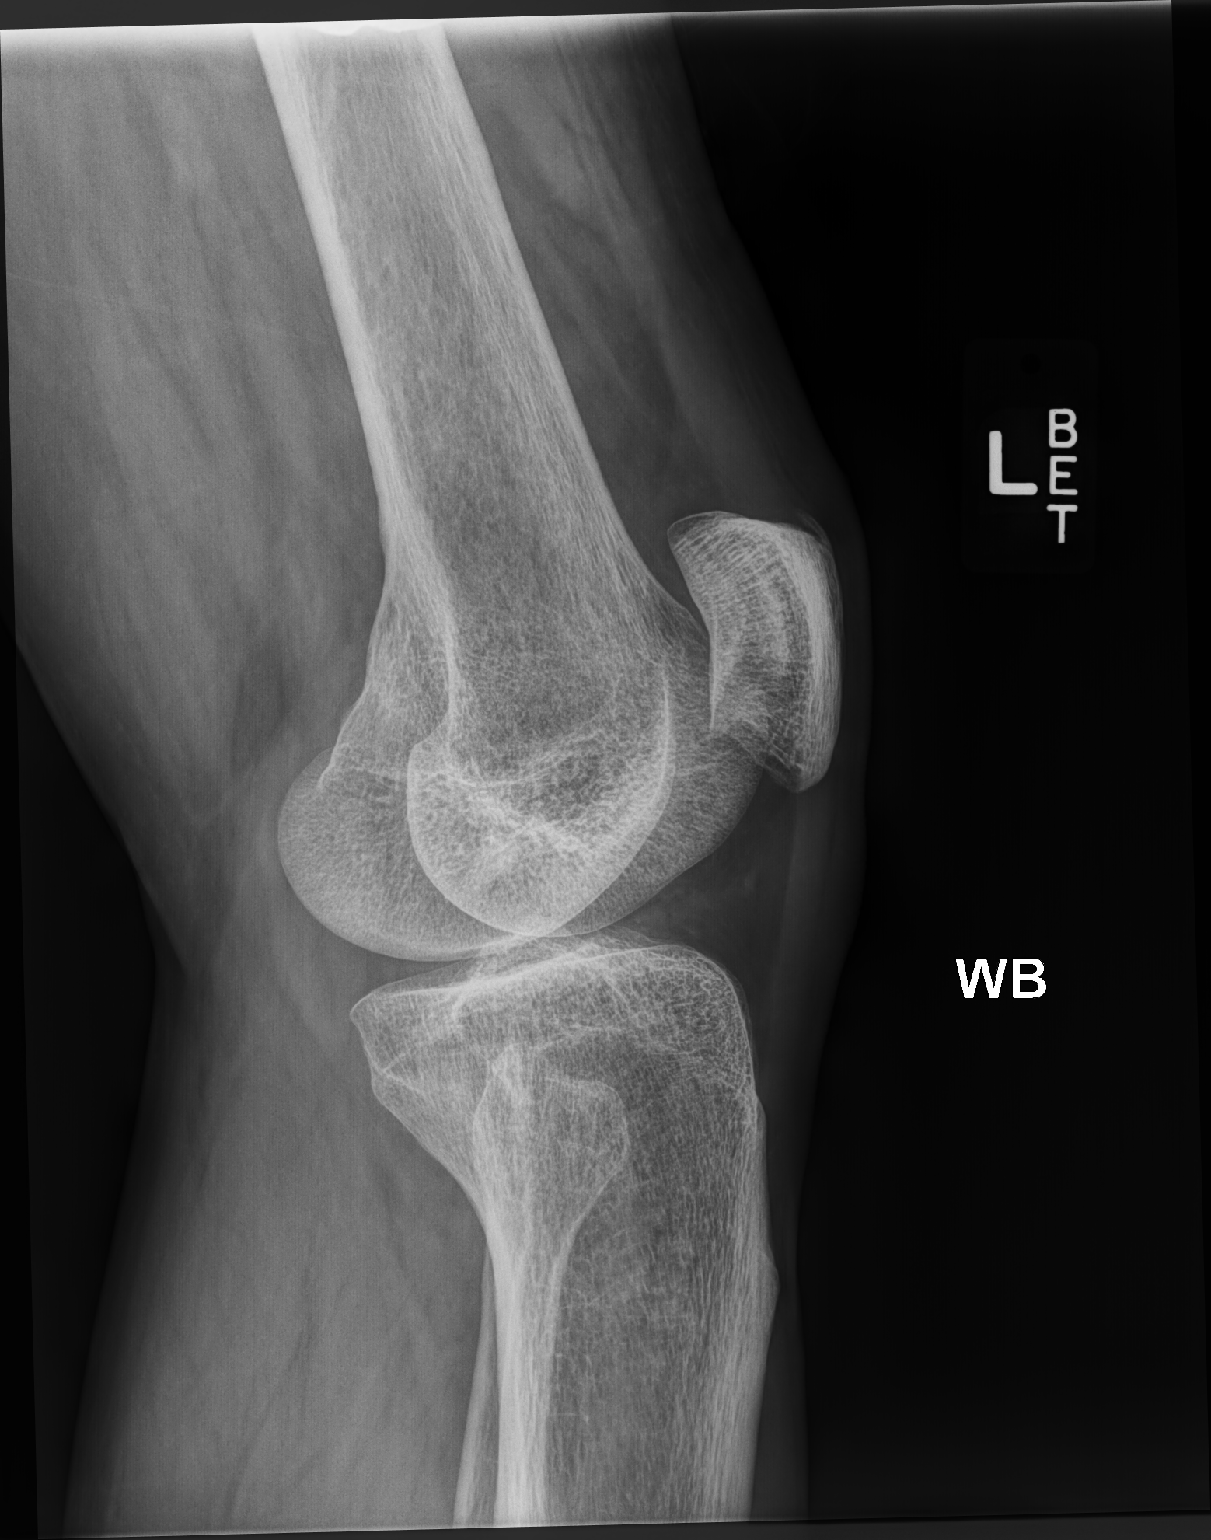

[2 of 2 positions shown; findings below may reference images not displayed]

FINDINGS: Mild medial joint space narrowing is noted. No acute fracture or
dislocation is seen. No soft tissue abnormality is noted.
IMPRESSION: Mild degenerative change without acute abnormality.

## 2017-08-19 IMAGING — DX DG LUMBAR SPINE 2-3V
3 series · 3 of 3 positions shown · non-contrast
Comparison: None.

CLINICAL DATA: Low back tightness, no known injury, initial
encounter

EXAM:
LUMBAR SPINE - 3 VIEW

[lumbar spine ap]
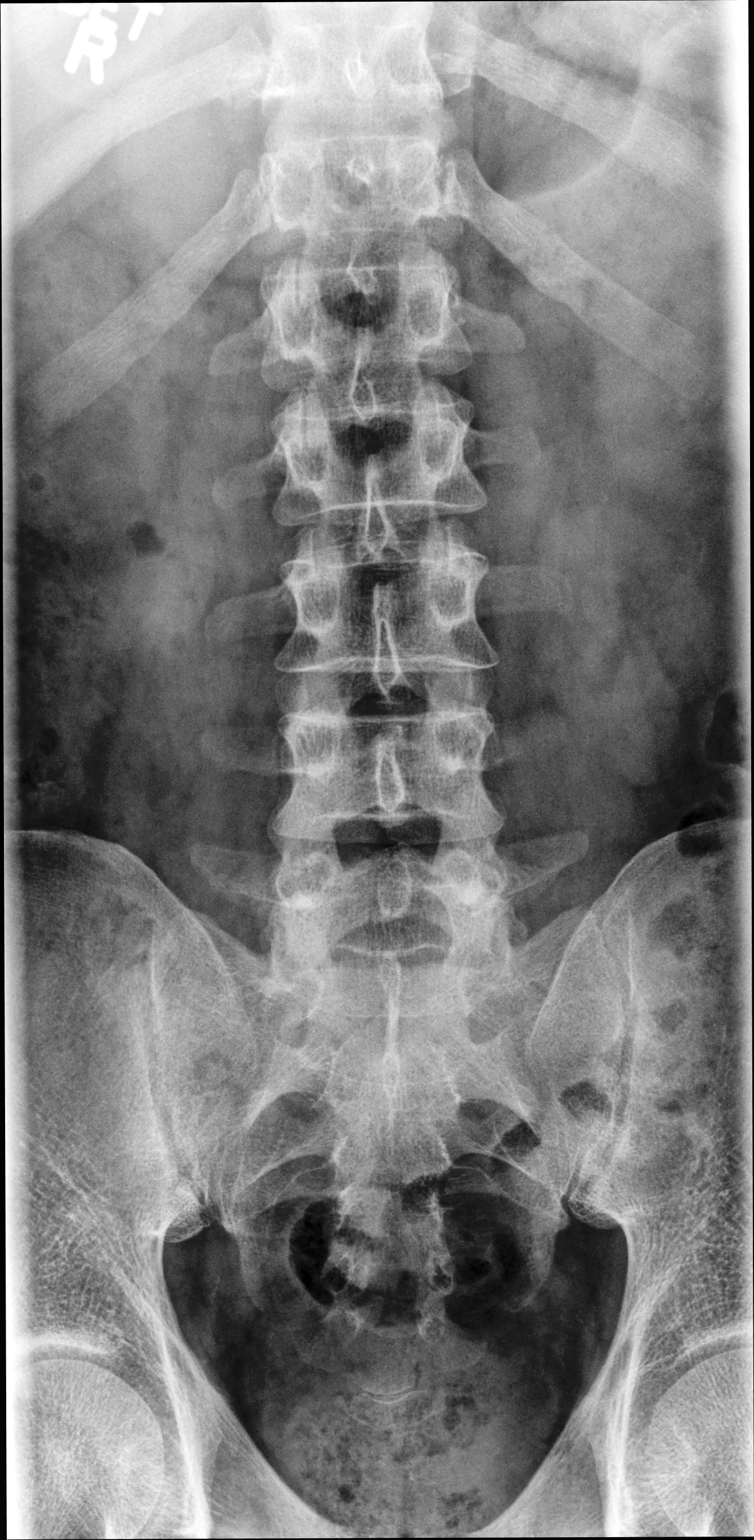

[lumbar spine lat (1 of 2)]
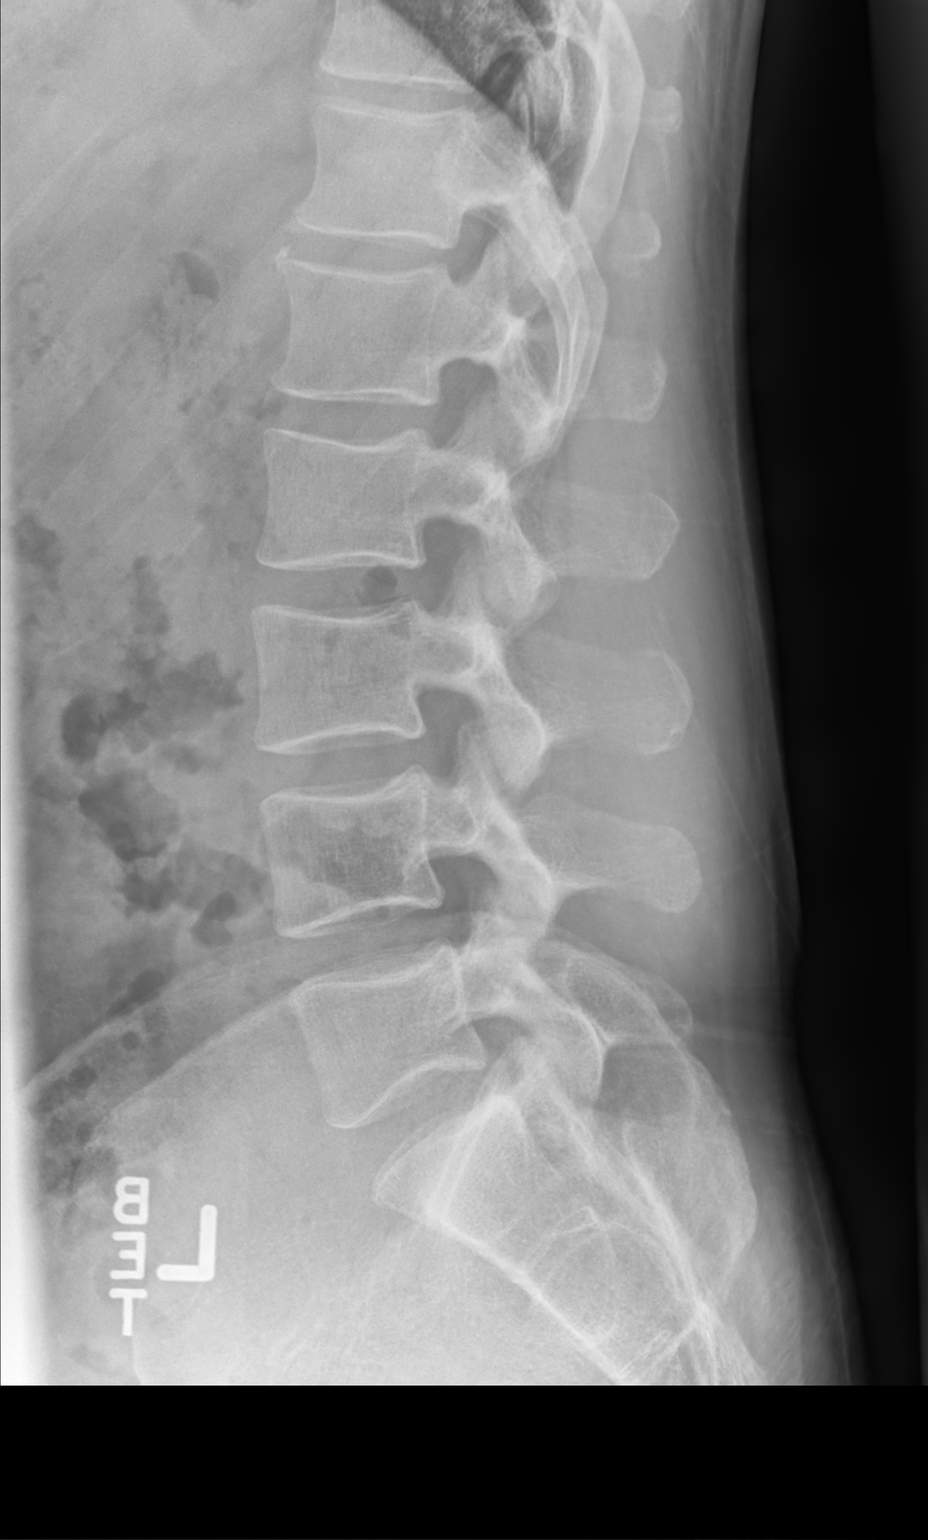

[lumbar spine lat (2 of 2)]
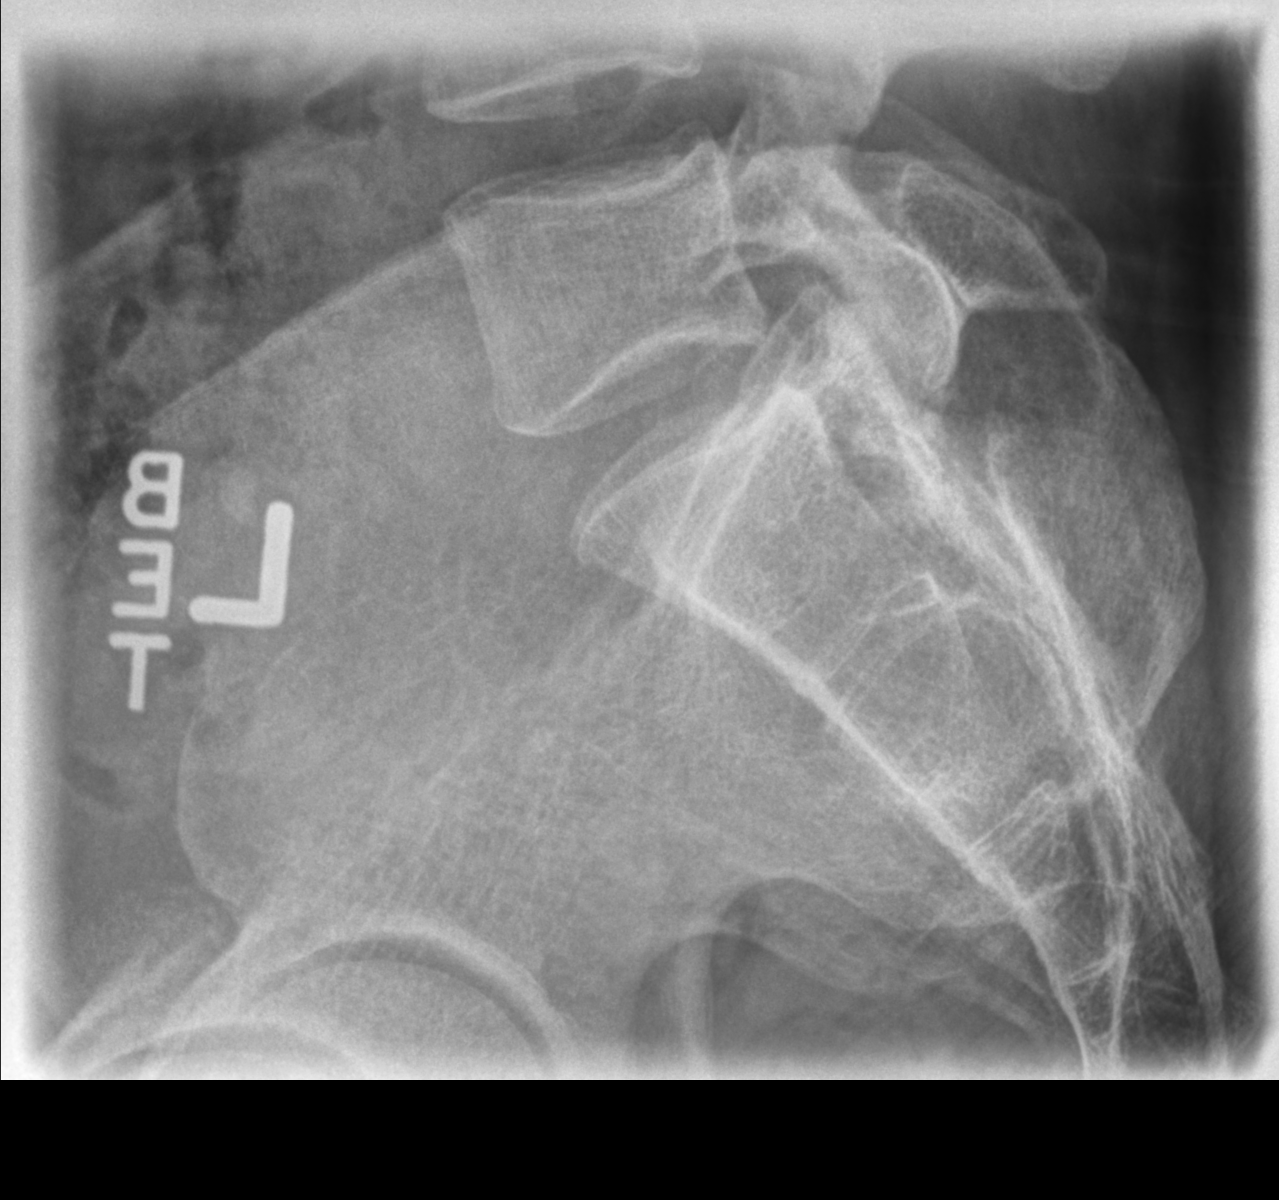

[3 of 3 positions shown; findings below may reference images not displayed]

FINDINGS: Five lumbar type vertebral bodies are well visualized. Vertebral
body height is well maintained. No anterolisthesis is seen. No soft
tissue abnormality is noted.
IMPRESSION: No acute abnormality seen.

## 2017-08-25 ENCOUNTER — Ambulatory Visit: Payer: BLUE CROSS/BLUE SHIELD | Admitting: Internal Medicine

## 2017-09-01 ENCOUNTER — Other Ambulatory Visit: Payer: Self-pay | Admitting: Family Medicine

## 2017-09-11 DIAGNOSIS — H1032 Unspecified acute conjunctivitis, left eye: Secondary | ICD-10-CM | POA: Diagnosis not present

## 2017-09-13 DIAGNOSIS — H10502 Unspecified blepharoconjunctivitis, left eye: Secondary | ICD-10-CM | POA: Diagnosis not present

## 2017-09-22 DIAGNOSIS — H2513 Age-related nuclear cataract, bilateral: Secondary | ICD-10-CM | POA: Diagnosis not present

## 2017-09-22 DIAGNOSIS — H10502 Unspecified blepharoconjunctivitis, left eye: Secondary | ICD-10-CM | POA: Diagnosis not present

## 2017-09-22 DIAGNOSIS — B309 Viral conjunctivitis, unspecified: Secondary | ICD-10-CM | POA: Diagnosis not present

## 2017-09-22 DIAGNOSIS — H5712 Ocular pain, left eye: Secondary | ICD-10-CM | POA: Diagnosis not present

## 2017-09-29 DIAGNOSIS — H40053 Ocular hypertension, bilateral: Secondary | ICD-10-CM | POA: Diagnosis not present

## 2017-09-29 DIAGNOSIS — B309 Viral conjunctivitis, unspecified: Secondary | ICD-10-CM | POA: Diagnosis not present

## 2017-10-06 DIAGNOSIS — H1820 Unspecified corneal edema: Secondary | ICD-10-CM | POA: Diagnosis not present

## 2017-10-06 DIAGNOSIS — H53001 Unspecified amblyopia, right eye: Secondary | ICD-10-CM | POA: Diagnosis not present

## 2017-10-06 DIAGNOSIS — B309 Viral conjunctivitis, unspecified: Secondary | ICD-10-CM | POA: Diagnosis not present

## 2017-10-06 DIAGNOSIS — H40053 Ocular hypertension, bilateral: Secondary | ICD-10-CM | POA: Diagnosis not present

## 2017-10-13 DIAGNOSIS — H53001 Unspecified amblyopia, right eye: Secondary | ICD-10-CM | POA: Diagnosis not present

## 2017-10-13 DIAGNOSIS — H1820 Unspecified corneal edema: Secondary | ICD-10-CM | POA: Diagnosis not present

## 2017-10-13 DIAGNOSIS — B309 Viral conjunctivitis, unspecified: Secondary | ICD-10-CM | POA: Diagnosis not present

## 2017-10-13 DIAGNOSIS — H40053 Ocular hypertension, bilateral: Secondary | ICD-10-CM | POA: Diagnosis not present

## 2017-10-20 ENCOUNTER — Ambulatory Visit: Payer: BLUE CROSS/BLUE SHIELD | Admitting: Internal Medicine

## 2017-10-20 ENCOUNTER — Encounter: Payer: Self-pay | Admitting: Internal Medicine

## 2017-10-20 VITALS — BP 132/84 | HR 94 | Ht 68.25 in | Wt 183.2 lb

## 2017-10-20 DIAGNOSIS — E1165 Type 2 diabetes mellitus with hyperglycemia: Secondary | ICD-10-CM | POA: Diagnosis not present

## 2017-10-20 DIAGNOSIS — E785 Hyperlipidemia, unspecified: Secondary | ICD-10-CM | POA: Diagnosis not present

## 2017-10-20 LAB — POCT GLYCOSYLATED HEMOGLOBIN (HGB A1C): HEMOGLOBIN A1C: 6.8

## 2017-10-20 NOTE — Progress Notes (Signed)
Patient ID: Gabriel Hamilton, male   DOB: 1971-02-26, 47 y.o.   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 Last visit 4 months ago.  Since last visit, he and his wife started to change their diet: vegetarian for 30 days, then introduced animal products, but not processed, eats more fruit and veggies now. He lost ~ 6 lbs.  Last month he had an eye infection >> lost vision >> was on steroid drops >> sugars higher.  Since then, his infection resolved and he regained his vision.  Last hemoglobin A1c was: Lab Results  Component Value Date   HGBA1C 6.8 10/20/2017   HGBA1C 8.1 05/26/2017   HGBA1C 7.4 (H) 10/21/2016   HGBA1C 7.7 03/18/2016   HGBA1C 7.4 05/29/2015   HGBA1C 6.6 (H) 01/30/2015   HGBA1C 7.1 (H) 09/05/2014   HGBA1C 8.1 (H) 04/25/2014   HGBA1C 9.2 (H) 12/13/2013   HGBA1C 10.1 (H) 08/23/2013  12/18/2015: Calculated HbA1c from [facility]: 7.5%  He is on: - Metformin 1000 mg 2x a day, with meals >> nausea >> now Metformin ER 2000 mg with dinner-he tolerates this a little better - Amaryl 4 mg daily in am. - Invokana 100 mg before breakfast - Januvia 100 mg before breakfast - started 05/2017 He also tried [place] >> worked well. He also tried Onglyza in the past.   Pt checks his sugars 2-4x a day: - am: 140-160 >> 112-160, 172, 196  >> 110-153, 170, 195 - 2h after b'fast: 147 >> n/c >> 176, 269 x1 >> 163 - before lunch: 82-148 >> 120-130 >> n/c >> 130-140 - 2h after lunch:  n/c >> 130s >> n/c >> 200 x1 >> 138-160 - before dinner: 100-115 >> 78, 108-163, 177, 223 >> 97-136 - 2h after dinner: 150s >> 164 >> 131-219 >> 154 - bedtime: 130s >> see above >> n/c >> 132-173, 184 >> 129-168 - nighttime: n/c Lowest sugar was 100 >> 78 in 01/2017 >> 97; he has hypoglycemia awareness in the 70s. Highest sugar was 192 >> 269 >> 195.  Meter: [employer]k [employer]  Pt's meals  are: - Breakfast: velveeta - Lunch: salad, may skip - Dinner: meat (chicken, sausage, steak), veggies, starch (potatoes, rice); stews - Snack: walnuts, trailmix Drinks water.  -No CKD, last BUN/creatinine:  Lab Results  Component Value Date   BUN 13 06/09/2017   CREATININE 0.82 06/09/2017  On Avapro.  - + HL; last set of lipids: Lab Results  Component Value Date   CHOL 176 06/09/2017   HDL 54.40 06/09/2017   LDLCALC 74 12/05/2014   LDLDIRECT 117.0 06/09/2017   TRIG 204.0 (H) 06/09/2017   CHOLHDL 3 06/09/2017  Not on a statin. 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 Dr. Gershon Crane. - + numbness and tingling in his feet. Dr Caffie Pinto - podiatry.  ROS: Constitutional: + weight loss, no fatigue, no subjective hyperthermia, no subjective hypothermia Eyes: + Blurry vision, no xerophthalmia ENT: + Sore throat, no nodules palpated in throat, no dysphagia, no odynophagia, no hoarseness Cardiovascular: no CP/no SOB/no palpitations/+ leg swelling Respiratory: no cough/no SOB/no wheezing Gastrointestinal: no N/no V/no D/no C/+ acid reflux Musculoskeletal:  +muscle aches/+ joint aches Skin: no rashes, no hair loss Neurological: no tremors/+ numbness/+ tingling/no dizziness  I reviewed pt's medications, allergies, PMH, social hx, family hx, and changes were documented in the history of present illness. Otherwise, unchanged from  my initial visit note.  PE: BP 132/84   Pulse 94   Ht 5' 8.25" (1.734 m)   Wt 183 lb 3.2 oz (83.1 kg)   SpO2 99%   BMI 27.65 kg/m  Body mass index is 27.65 kg/m.  Wt Readings from Last 3 Encounters:  10/20/17 183 lb 3.2 oz (83.1 kg)  08/05/17 186 lb 12.8 oz (84.7 kg)  06/09/17 189 lb 3.2 oz (85.8 kg)   Constitutional: overweight, in NAD Eyes: PERRLA, EOMI, no exophthalmos ENT: moist mucous membranes, no thyromegaly, no cervical lymphadenopathy Cardiovascular: Tachycardia, RR, No MRG Respiratory: CTA B Gastrointestinal: abdomen  soft, NT, ND, BS+ Musculoskeletal: no deformities, strength intact in all 4 Skin: moist, warm, no rashes Neurological: no tremor with outstretched hands, DTR normal in all 4  ASSESSMENT: 1. DM2, non-insulin-dependent, uncontrolled, with complications: - Moderate NPDR OS - PN  2. HL  3.  Overweight  PLAN:  1. Patient with long-standing, uncontrolled, type 2 diabetes, with worse sugars at last visit due to hyperglycemic spikes with dietary indiscretions..  His HbA1c was higher, at 8.1%.  We added Januvia at that time.  We also discussed about improving diet and to try to stay active. - He did great since last visit improving his diet >>  Now on a predominantly plant-based diet but reintroduced good quality meats.  His sugars remained at goal during the majority of the day, but slightly higher in the morning.  As he continues to adjust his diet and plans to be more active over the summer, I do not feel that he needs a change in his regimen for now - I suggested to: Patient Instructions  Please continue: - Metformin ER 2000 mg at dinnertime - Amaryl 4 mg daily in am. - Invokana 100 mg before breakfast - Januvia 100 mg before breakfast  Please come back for a follow-up appointment in 4 months.  - today, HbA1c is 6.8% (much better) - continue checking sugars at different times of the day - check 1x a day, rotating checks - advised for yearly eye exams >> he is UTD - Return to clinic in 4 mo with sugar log   2. HL - Reviewed latest lipid panel from 05/2017: LDL close to goal (worse c/w 2016) but triglycerides high - He is not on a statin - May need to check his lipid panel at next visit and if the LDL remains high despite the change in diet, he agrees to start a low-dose statin  3. Overweight -Lost 6 pounds since last visit by changing his diet.  I congratulated him and advised him to continue with a new diet -At next visit, if sugars remain controlled, I plan to stop his Amaryl and  this should also help with weight loss  Philemon Kingdom, MD PhD [facility]

## 2017-10-20 NOTE — Patient Instructions (Addendum)
Please continue: - Metformin ER 2000 mg at dinnertime - Amaryl 4 mg daily in am. - Invokana 100 mg before breakfast - Januvia 100 mg before breakfast  Please come back for a follow-up appointment in 4 months.

## 2017-11-03 DIAGNOSIS — H2513 Age-related nuclear cataract, bilateral: Secondary | ICD-10-CM | POA: Diagnosis not present

## 2017-11-03 DIAGNOSIS — B309 Viral conjunctivitis, unspecified: Secondary | ICD-10-CM | POA: Diagnosis not present

## 2017-11-03 DIAGNOSIS — H53001 Unspecified amblyopia, right eye: Secondary | ICD-10-CM | POA: Diagnosis not present

## 2017-12-03 ENCOUNTER — Other Ambulatory Visit: Payer: Self-pay | Admitting: Internal Medicine

## 2017-12-08 ENCOUNTER — Encounter: Payer: Self-pay | Admitting: Family Medicine

## 2017-12-08 ENCOUNTER — Ambulatory Visit: Payer: BLUE CROSS/BLUE SHIELD | Admitting: Family Medicine

## 2017-12-08 VITALS — BP 118/80 | HR 87 | Temp 97.9°F | Ht 68.25 in | Wt 181.0 lb

## 2017-12-08 DIAGNOSIS — E785 Hyperlipidemia, unspecified: Secondary | ICD-10-CM | POA: Diagnosis not present

## 2017-12-08 DIAGNOSIS — D751 Secondary polycythemia: Secondary | ICD-10-CM

## 2017-12-08 DIAGNOSIS — K219 Gastro-esophageal reflux disease without esophagitis: Secondary | ICD-10-CM | POA: Diagnosis not present

## 2017-12-08 DIAGNOSIS — Z720 Tobacco use: Secondary | ICD-10-CM

## 2017-12-08 MED ORDER — OMEPRAZOLE 40 MG PO CPDR: DELAYED_RELEASE_CAPSULE | ORAL | 3 refills | Status: DC

## 2017-12-08 MED ORDER — ATORVASTATIN CALCIUM 40 MG PO TABS: 40.0000 mg | ORAL_TABLET | Freq: Every day | ORAL | 3 refills | Status: DC

## 2017-12-08 NOTE — Assessment & Plan Note (Signed)
S: polycythemia likely related to smoking. He was donating to [facility] in past- last visit stated he would start back. He has not started back- he plans to do that.  A/P: encouraged cutting out smoking- he is working on that. We will continue to trend this. He wants to skip CBC for today and do full labs with physical.

## 2017-12-08 NOTE — Assessment & Plan Note (Addendum)
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 b12 was ok last visit

## 2017-12-08 NOTE — Patient Instructions (Signed)
Please stop by lab before you go  Start atorvastatin 40mg  once a week on wednesdays

## 2017-12-08 NOTE — Assessment & Plan Note (Signed)
S: smoking 1/2 PPD last visit- now down to 1/2 pack every 2-3 days so 3-5 cigarettes- worse at work A/P: encouraged complete cessation but he has done a great job cutting down- hoping he is able to be fully off by physical- he is working on this

## 2017-12-08 NOTE — Assessment & Plan Note (Signed)
S: poorly controlled on no rx. He was supposed to have atorvastatin sent in Lab Results  Component Value Date   CHOL 176 06/09/2017   HDL 54.40 06/09/2017   LDLCALC 74 12/05/2014   LDLDIRECT 117.0 06/09/2017   TRIG 204.0 (H) 06/09/2017   CHOLHDL 3 06/09/2017   A/P: we discussed options- he prefers once a week treatment- will try atorvastatin and can repeat at physical- full lipid

## 2017-12-08 NOTE — Progress Notes (Signed)
Subjective:  Gabriel Hamilton is a 47 y.o. year old very pleasant male patient who presents for/with See problem oriented charting ROS- No chest pain or shortness of breath. No headache or blurry vision.    Past Medical History-  Patient Active Problem List   Diagnosis Date Noted  . Type 2 diabetes mellitus with hyperglycemia, without long-term current use of insulin ([place]) 12/18/2015    Priority: High  . Tobacco abuse 12/22/2011    Priority: High  . Hyperlipidemia, unspecified 06/06/2014    Priority: Medium  . Polycythemia 08/26/2011    Priority: Medium  . Essential hypertension 07/10/2009    Priority: Medium  . Ingrown nail 07/24/2015    Priority: Low  . 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 Metatarsal deformity 05/08/2015    Priority: Low  . Equinus deformity of foot, acquired 05/08/2015    Priority: Low  . Onychomycosis 12/26/2014    Priority: Low  . Tachycardia 02/14/2014    Priority: Low  . Acne     Priority: Low  . HYPOGONADISM 11/21/2009    Priority: Low  . GERD 03/27/2008    Priority: Low  . HEART MURMUR 03/27/2008    Priority: Low  . 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 ampicillin (PRINCIPEN) 500 MG capsule TAKE ONE CAPSULE BY MOUTH TWICE DAILY WITH FOOD    . BAYER MICROLET LANCETS lancets Use as instructed to check sugar twice daily 200 each 5  . Blood Glucose Monitoring Suppl (BAYER CONTOUR NEXT MONITOR) w/Device KIT Use to check sugar twice daily 1 kit 0  . dicyclomine (BENTYL) 20 MG tablet Take 1 tablet (20 mg total) by mouth 2 (two) times daily. 20 tablet 0  . glimepiride (AMARYL) 4 MG tablet TAKE ONE TABLET BY MOUTH ONE TIME DAILY BEFORE BREAKFAST 30 tablet 11  . glucose blood (BAYER CONTOUR NEXT TEST) test strip Use as instructed to check sugar twice daily 200 each 5  . INVOKANA 100 MG TABS tablet TAKE 1 TABLET  BY MOUTH EVERY MORNING 30 tablet 7  . JANUVIA 100 MG tablet TAKE 1 TABLET BY MOUTH EVERY DAY 30 tablet 5  . metFORMIN (GLUCOPHAGE-XR) 500 MG 24 hr tablet TAKE 4 TABLETS (2,000 MG TOTAL) BY MOUTH DAILY WITH BREAKFAST. 360 tablet 3  . naproxen sodium (ANAPROX) 220 MG tablet USING FOR JOINT PAIN    . omeprazole (PRILOSEC) 40 MG capsule TAKE 1 CAPSULE BY MOUTH EVERY DAY 90 capsule 3  . OVER THE COUNTER MEDICATION On an antibiotic for skin-long term     No current facility-administered medications for this visit.     Objective: BP 118/80 (BP Location: Left Arm, Patient Position: Sitting, Cuff Size: Normal)   Pulse 87   Temp 97.9 F (36.6 C) (Oral)   Ht 5' 8.25" (1.734 m)   Wt 181 lb (82.1 kg)   SpO2 97%   BMI 27.32 kg/m  Gen: NAD, resting comfortably CV: RRR no murmurs rubs or gallops Lungs: CTAB no crackles, wheeze, rhonchi Abdomen: soft/nontender/nondistended/normal bowel sounds.  Ext: no edema Skin: warm, dry  Assessment/Plan:  Other notes: 1.following with Dr. Cruzita Lederer- remains on invokana, metformin, januvia, amaryl. Last a1c under 7!!! He has improved his diet- cut down on bread- did vegetarian for a month but now off that.  2. Recent eye infection- had nearly 10 visits to clear this up- he is thaknful to have  this behind him- was off work for 2 weeks- couldn't read or see CPU monitor   Hyperlipidemia, unspecified S: poorly controlled on no rx. He was supposed to have atorvastatin sent in Lab Results  Component Value Date   CHOL 176 06/09/2017   HDL 54.40 06/09/2017   LDLCALC 74 12/05/2014   LDLDIRECT 117.0 06/09/2017   TRIG 204.0 (H) 06/09/2017   CHOLHDL 3 06/09/2017   A/P: we discussed options- he prefers once a week treatment- will try atorvastatin and can repeat at physical- full lipid  Tobacco abuse S: smoking 1/2 PPD last visit- now down to 1/2 pack every 2-3 days so 3-5 cigarettes- worse at work A/P: encouraged complete cessation but he has done a great job  cutting down- hoping he is able to be fully off by physical- he is working on this  Polycythemia S: polycythemia likely related to smoking. He was donating to [facility] in past- last visit stated he would start back. He has not started back- he plans to do that.  A/P: encouraged cutting out smoking- he is working on that. We will continue to trend this. He wants to skip CBC for today and do full labs with physical.   GERD S: controlled for the most part- only time he has had breakthrough is at [employer]- spicy trucks A/P: continue current meds- refill today. b12 was ok last visit   Future Appointments  Date Time Provider [place]  03/09/2018  8:00 AM Philemon Kingdom, MD [facility] None   6 months CPE  Meds ordered this encounter  Medications  . omeprazole (PRILOSEC) 40 MG capsule    Sig: TAKE 1 CAPSULE BY MOUTH EVERY DAY    Dispense:  90 capsule    Refill:  3  . atorvastatin (LIPITOR) 40 MG tablet    Sig: Take 1 tablet (40 mg total) by mouth daily.    Dispense:  14 tablet    Refill:  3    Return precautions advised.  Garret Reddish, MD

## 2018-01-01 ENCOUNTER — Other Ambulatory Visit: Payer: Self-pay | Admitting: Internal Medicine

## 2018-01-05 ENCOUNTER — Other Ambulatory Visit: Payer: Self-pay | Admitting: Internal Medicine

## 2018-01-06 ENCOUNTER — Other Ambulatory Visit: Payer: Self-pay | Admitting: Family Medicine

## 2018-01-09 ENCOUNTER — Encounter: Payer: Self-pay | Admitting: Family Medicine

## 2018-02-02 DIAGNOSIS — H2513 Age-related nuclear cataract, bilateral: Secondary | ICD-10-CM | POA: Diagnosis not present

## 2018-02-02 DIAGNOSIS — B309 Viral conjunctivitis, unspecified: Secondary | ICD-10-CM | POA: Diagnosis not present

## 2018-02-02 DIAGNOSIS — H53001 Unspecified amblyopia, right eye: Secondary | ICD-10-CM | POA: Diagnosis not present

## 2018-02-02 DIAGNOSIS — H168 Other keratitis: Secondary | ICD-10-CM | POA: Diagnosis not present

## 2018-02-23 DIAGNOSIS — H53001 Unspecified amblyopia, right eye: Secondary | ICD-10-CM | POA: Diagnosis not present

## 2018-02-23 DIAGNOSIS — B309 Viral conjunctivitis, unspecified: Secondary | ICD-10-CM | POA: Diagnosis not present

## 2018-02-23 DIAGNOSIS — H2513 Age-related nuclear cataract, bilateral: Secondary | ICD-10-CM | POA: Diagnosis not present

## 2018-02-23 DIAGNOSIS — H168 Other keratitis: Secondary | ICD-10-CM | POA: Diagnosis not present

## 2018-03-09 ENCOUNTER — Encounter: Payer: Self-pay | Admitting: Internal Medicine

## 2018-03-09 ENCOUNTER — Ambulatory Visit: Payer: BLUE CROSS/BLUE SHIELD | Admitting: Internal Medicine

## 2018-03-09 VITALS — BP 120/82 | HR 73 | Ht 68.25 in | Wt 184.0 lb

## 2018-03-09 DIAGNOSIS — E1165 Type 2 diabetes mellitus with hyperglycemia: Secondary | ICD-10-CM | POA: Diagnosis not present

## 2018-03-09 DIAGNOSIS — E785 Hyperlipidemia, unspecified: Secondary | ICD-10-CM | POA: Diagnosis not present

## 2018-03-09 LAB — POCT GLYCOSYLATED HEMOGLOBIN (HGB A1C): HEMOGLOBIN A1C: 6 % — AB (ref 4.0–5.6)

## 2018-03-09 MED ORDER — GLIMEPIRIDE 2 MG PO TABS: ORAL_TABLET | ORAL | Status: DC

## 2018-03-09 NOTE — Addendum Note (Signed)
Addended by: Cardell Peach I on: 03/09/2018 08:30 AM   Modules accepted: Orders

## 2018-03-09 NOTE — Progress Notes (Signed)
Patient ID: Gabriel Hamilton, male   DOB: 1970-09-19, 47 y.o.   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 Last visit 4.5 months ago.  Before last visit, he and his wife started to change her diet: Vegetarian + small amounts of animal products, but not processed.  Cut down bread since last visit.  He feels his sugars are even better now.  Last hemoglobin A1c was: Lab Results  Component Value Date   HGBA1C 6.8 10/20/2017   HGBA1C 8.1 05/26/2017   HGBA1C 7.4 (H) 10/21/2016   HGBA1C 7.7 03/18/2016   HGBA1C 7.4 05/29/2015   HGBA1C 6.6 (H) 01/30/2015   HGBA1C 7.1 (H) 09/05/2014   HGBA1C 8.1 (H) 04/25/2014   HGBA1C 9.2 (H) 12/13/2013   HGBA1C 10.1 (H) 08/23/2013  12/18/2015: Calculated HbA1c from [facility]: 7.5%  He is on: - Metformin ER 2000 mg at dinnertime - Amaryl 4 mg daily in am. - Invokana 100 mg before breakfast - Januvia 100 mg before breakfast -started 05/2017 He also tried [place] >> worked well. He also tried Onglyza in the past.   Pt checks his sugars 2-4 times a day: - am:  110-153, 170, 195 >> 76-130, 157, 174 - 2h after b'fast: 176, 269 x1 >> 163 >> 98-140 - before lunch: 120-130 >> n/c >> 130-140 >> 80-140, 150 - 2h after lunch: n/c >> 200 x1 >> 138-160 >> 119-150, 160 - before dinner: 78, 108-163, 177, 223 >> 97-136 >> 76-141 - 2h after dinner:164 >> 131-219 >> 154 >> 137-160 - bedtime: 132-173, 184 >> 129-168 >> 139-161, 170 - nighttime: n/c Lowest sugar was  97 >> 76; he has hypoglycemia awareness in the 70s. Highest sugar was 195 >> 174.  Meter: AccuChek Connect   Pt's meals are: - Breakfast: velveeta - Lunch: salad, may skip - Dinner: meat (chicken, sausage, steak), veggies, starch (potatoes, rice); stews - Snack: walnuts, trailmix Drinks water.  -No CKD, last BUN/creatinine:  Lab Results  Component Value Date   BUN 13 06/09/2017   CREATININE 0.82 06/09/2017  On Avapro.  -+ HL; last set of lipids: Lab Results  Component Value Date   CHOL 176 06/09/2017   HDL 54.40 06/09/2017   LDLCALC 74 12/05/2014   LDLDIRECT 117.0 06/09/2017   TRIG 204.0 (H) 06/09/2017   CHOLHDL 3 06/09/2017  On Lipitor 40 once a week - in last mo. 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 Dr. Gershon Crane. In 09/2017, he had an eye infection >> lost vision >> was on steroid drops >> sugars higher.  Since then, his infection resolved and he regained his vision. -+ Numbness and tingling in his feet. Dr Caffie Pinto - podiatry.  ROS: Constitutional: no weight gain/no weight loss, no fatigue, no subjective hyperthermia, no subjective hypothermia Eyes: no blurry vision, no xerophthalmia ENT: no sore throat, no nodules palpated in throat, no dysphagia, no odynophagia, no hoarseness Cardiovascular: no CP/no SOB/no palpitations/+ leg swelling Respiratory: no cough/no SOB/no wheezing Gastrointestinal: no N/no V/no D/no C/no acid reflux Musculoskeletal: + muscle aches/+ joint aches Skin: no rashes, no hair loss Neurological: no tremors/+ numbness/+ tingling/no dizziness  I reviewed pt's medications, allergies, PMH, social hx, family hx, and changes were documented in the history of present illness. Otherwise, unchanged from my initial visit note.  Past Medical History:  Diagnosis Date  . Acne   . Diabetes mellitus   . GERD (gastroesophageal  reflux disease)   . Heart murmur   . Hypertension    No past surgical history on file. Social History   Socioeconomic History  . Marital status: Married    Spouse name: Not on file  . Number of children: Not on file  . Years of education: Not on file  . Highest education level: Not on file  Occupational History  . Not on file  Social Needs  . Financial resource strain: Not on file  . Food insecurity:    Worry: Not on file    Inability: Not on file  . Transportation needs:     Medical: Not on file    Non-medical: Not on file  Tobacco Use  . Smoking status: Current Every Day Smoker    Packs/day: 0.50    Years: 22.00    Pack years: 11.00    Types: Cigarettes  . Smokeless tobacco: Never Used  Substance and Sexual Activity  . Alcohol use: Yes    Alcohol/week: 14.0 standard drinks    Types: 14 Standard drinks or equivalent per week  . Drug use: No  . Sexual activity: Yes  Lifestyle  . Physical activity:    Days per week: Not on file    Minutes per session: Not on file  . Stress: Not on file  Relationships  . Social connections:    Talks on phone: Not on file    Gets together: Not on file    Attends religious service: Not on file    Active member of club or organization: Not on file    Attends meetings of clubs or organizations: Not on file    Relationship status: Not on file  . Intimate partner violence:    Fear of current or ex partner: Not on file    Emotionally abused: Not on file    Physically abused: Not on file    Forced sexual activity: Not on file  Other Topics Concern  . Not on file  Social History Narrative   Married for 8 years in 2015. Wife with 2 sons from previous marriage. 3 grandsons. 1 niece. Close family.       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 ACCU-CHEK GUIDE test strip USE AS INSTRUCTED TO CHECK SUGAR TWICE DAILY. 200 each 2  . ampicillin (PRINCIPEN) 500 MG capsule TAKE ONE CAPSULE BY MOUTH TWICE DAILY WITH FOOD    . atorvastatin (LIPITOR) 40 MG tablet TAKE 1 TABLET BY MOUTH EVERY DAY 90 tablet 1  . BAYER MICROLET LANCETS lancets Use as instructed to check sugar twice daily 200 each 5  . Blood Glucose Monitoring Suppl (BAYER CONTOUR NEXT MONITOR) w/Device KIT Use to check sugar twice daily 1 kit 0  . dicyclomine  (BENTYL) 20 MG tablet Take 1 tablet (20 mg total) by mouth 2 (two) times daily. 20 tablet 0  . glimepiride (AMARYL) 4 MG tablet TAKE ONE TABLET BY MOUTH ONE TIME DAILY BEFORE BREAKFAST 30 tablet 11  . glucose blood (BAYER CONTOUR NEXT TEST) test strip Use as instructed to check sugar twice daily 200 each 5  . INVOKANA 100 MG TABS tablet TAKE 1 TABLET BY MOUTH EVERY MORNING 90 tablet 0  . JANUVIA 100 MG tablet TAKE 1 TABLET BY MOUTH EVERY DAY 30  tablet 5  . metFORMIN (GLUCOPHAGE-XR) 500 MG 24 hr tablet TAKE 4 TABLETS (2,000 MG TOTAL) BY MOUTH DAILY WITH BREAKFAST. 360 tablet 3  . naproxen sodium (ANAPROX) 220 MG tablet USING FOR JOINT PAIN    . omeprazole (PRILOSEC) 40 MG capsule TAKE 1 CAPSULE BY MOUTH EVERY DAY 90 capsule 3  . OVER THE COUNTER MEDICATION On an antibiotic for skin-long term     No current facility-administered medications on file prior to visit.    Allergies  Allergen Reactions  . Benazepril     May have caused angioedema   Family History  Problem Relation Age of Onset  . Diabetes Father   . Heart disease Father   . Hyperlipidemia Father   . Hypertension Father   . Arthritis Mother   . Cancer Mother        optic nerve    PE: BP 120/82   Pulse 73   Ht 5' 8.25" (1.734 m)   Wt 194 lb (88 kg)   SpO2 96%   BMI 29.28 kg/m  Body mass index is 27.77 kg/m.  Wt Readings from Last 3 Encounters:  03/09/18 184 lb (83.5 kg)  12/08/17 181 lb (82.1 kg)  10/20/17 183 lb 3.2 oz (83.1 kg)   Constitutional: overweight, in NAD Eyes: PERRLA, EOMI, no exophthalmos ENT: moist mucous membranes, no thyromegaly, no cervical lymphadenopathy Cardiovascular: RRR, No MRG Respiratory: CTA B Gastrointestinal: abdomen soft, NT, ND, BS+ Musculoskeletal: no deformities, strength intact in all 4 Skin: moist, warm, no rashes Neurological: no tremor with outstretched hands, DTR normal in all 4  ASSESSMENT: 1. DM2, non-insulin-dependent, uncontrolled, with complications: - Moderate  NPDR OS - PN  2. HL  3.  Overweight  PLAN:  1. Patient with long-standing, uncontrolled, type 2 diabetes, with improved sugars at last visit after adding Januvia and improving his diet to a predominantly plant-based one.  He reintroduced good quality meats and his sugars remained at goal during the majority of the day, but slightly higher in the morning.  We did not change the regimen at last visit -at that time  his HbA1c was improved, at 6.8% -At this visit, sugars are mostly at goal, with occasional higher ones before meals, but he does snack on fruit and it seems like the pre-meal sugars may have been checked after a snack -No significant low blood sugars in the highest sugar he had was in the 170s. -For now, we can start de-escalating his regimen, first by decreasing Amaryl.  I hope we can stop this at next visit. - I suggested to: Patient Instructions  Please continue: - Metformin ER 2000 mg at dinnertime - Amaryl 4 mg daily in am. - Invokana 100 mg before breakfast - Januvia 100 mg before breakfast  Please come back for a follow-up appointment in 4 months.  - today, HbA1c is 6.0% (improved) - continue checking sugars at different times of the day - check 1x a day, rotating checks - advised for yearly eye exams >> he is UTD - Refuses flu shot today - Return to clinic in 4 mo with sugar log    2. HL - Reviewed latest lipid panel from 05/2017: LDL close to goal, but worse compared to 2016; triglycerides high Lab Results  Component Value Date   CHOL 176 06/09/2017   HDL 54.40 06/09/2017   LDLCALC 74 12/05/2014   LDLDIRECT 117.0 06/09/2017   TRIG 204.0 (H) 06/09/2017   CHOLHDL 3 06/09/2017  -He is on a statin now -  Lipitor >> no SEs but he takes it once a week only -He will need another lipid panel soon at next appointment with PCP  3. Overweight -Continue SGLT2 inhibitor which also helps with weight loss.  I also hope that we can stop his glimepiride soon, and should also  help.  For now, we can decrease the dose, since his sugars are mostly at goal -He gained 3 pounds since last visit, but he had company over the weekend and mentions that he ate more.  Philemon Kingdom, MD PhD [facility]

## 2018-03-09 NOTE — Patient Instructions (Addendum)
Please continue: - Metformin ER 2000 mg at dinnertime - Invokana 100 mg before breakfast - Januvia 100 mg before breakfast  Please decrease: - Amaryl to 2 mg daily in am.  Please come back for a follow-up appointment in 4 months.

## 2018-03-30 DIAGNOSIS — B309 Viral conjunctivitis, unspecified: Secondary | ICD-10-CM | POA: Diagnosis not present

## 2018-03-30 DIAGNOSIS — H168 Other keratitis: Secondary | ICD-10-CM | POA: Diagnosis not present

## 2018-03-30 DIAGNOSIS — H2513 Age-related nuclear cataract, bilateral: Secondary | ICD-10-CM | POA: Diagnosis not present

## 2018-03-30 DIAGNOSIS — H53001 Unspecified amblyopia, right eye: Secondary | ICD-10-CM | POA: Diagnosis not present

## 2018-04-03 ENCOUNTER — Other Ambulatory Visit: Payer: Self-pay | Admitting: Internal Medicine

## 2018-04-13 DIAGNOSIS — M25572 Pain in left ankle and joints of left foot: Secondary | ICD-10-CM | POA: Diagnosis not present

## 2018-04-13 DIAGNOSIS — M25562 Pain in left knee: Secondary | ICD-10-CM | POA: Diagnosis not present

## 2018-04-13 DIAGNOSIS — M25552 Pain in left hip: Secondary | ICD-10-CM | POA: Diagnosis not present

## 2018-06-01 ENCOUNTER — Other Ambulatory Visit: Payer: Self-pay | Admitting: Internal Medicine

## 2018-06-08 ENCOUNTER — Encounter: Payer: Self-pay | Admitting: Family Medicine

## 2018-06-08 ENCOUNTER — Ambulatory Visit: Payer: BLUE CROSS/BLUE SHIELD | Admitting: Family Medicine

## 2018-06-08 VITALS — BP 118/82 | HR 88 | Temp 98.2°F | Ht 68.25 in | Wt 183.0 lb

## 2018-06-08 DIAGNOSIS — D751 Secondary polycythemia: Secondary | ICD-10-CM | POA: Diagnosis not present

## 2018-06-08 DIAGNOSIS — Z23 Encounter for immunization: Secondary | ICD-10-CM

## 2018-06-08 DIAGNOSIS — K219 Gastro-esophageal reflux disease without esophagitis: Secondary | ICD-10-CM

## 2018-06-08 DIAGNOSIS — Z6827 Body mass index (BMI) 27.0-27.9, adult: Secondary | ICD-10-CM

## 2018-06-08 DIAGNOSIS — E1165 Type 2 diabetes mellitus with hyperglycemia: Secondary | ICD-10-CM | POA: Diagnosis not present

## 2018-06-08 DIAGNOSIS — E785 Hyperlipidemia, unspecified: Secondary | ICD-10-CM

## 2018-06-08 DIAGNOSIS — Z720 Tobacco use: Secondary | ICD-10-CM

## 2018-06-08 LAB — CBC WITH DIFFERENTIAL/PLATELET
Basophils Absolute: 0.1 10*3/uL (ref 0.0–0.1)
Basophils Relative: 1.1 % (ref 0.0–3.0)
EOS PCT: 3.8 % (ref 0.0–5.0)
Eosinophils Absolute: 0.2 10*3/uL (ref 0.0–0.7)
HCT: 51.1 % (ref 39.0–52.0)
Hemoglobin: 17.2 g/dL — ABNORMAL HIGH (ref 13.0–17.0)
Lymphocytes Relative: 37.1 % (ref 12.0–46.0)
Lymphs Abs: 1.9 10*3/uL (ref 0.7–4.0)
MCHC: 33.7 g/dL (ref 30.0–36.0)
MCV: 97.1 fl (ref 78.0–100.0)
Monocytes Absolute: 0.3 10*3/uL (ref 0.1–1.0)
Monocytes Relative: 6.7 % (ref 3.0–12.0)
Neutro Abs: 2.7 10*3/uL (ref 1.4–7.7)
Neutrophils Relative %: 51.3 % (ref 43.0–77.0)
Platelets: 272 10*3/uL (ref 150.0–400.0)
RBC: 5.27 Mil/uL (ref 4.22–5.81)
RDW: 14.4 % (ref 11.5–15.5)
WBC: 5.2 10*3/uL (ref 4.0–10.5)

## 2018-06-08 LAB — LDL CHOLESTEROL, DIRECT: LDL DIRECT: 57 mg/dL

## 2018-06-08 LAB — COMPREHENSIVE METABOLIC PANEL
ALBUMIN: 4.5 g/dL (ref 3.5–5.2)
ALT: 14 U/L (ref 0–53)
AST: 19 U/L (ref 0–37)
Alkaline Phosphatase: 52 U/L (ref 39–117)
BUN: 10 mg/dL (ref 6–23)
CO2: 29 mEq/L (ref 19–32)
Calcium: 10 mg/dL (ref 8.4–10.5)
Chloride: 101 mEq/L (ref 96–112)
Creatinine, Ser: 0.93 mg/dL (ref 0.40–1.50)
GFR: 111.19 mL/min (ref >60.00)
Glucose, Bld: 137 mg/dL — ABNORMAL HIGH (ref 70–99)
POTASSIUM: 4.3 meq/L (ref 3.5–5.1)
Sodium: 141 mEq/L (ref 135–145)
Total Bilirubin: 0.6 mg/dL (ref 0.2–1.2)
Total Protein: 7.1 g/dL (ref 6.0–8.3)

## 2018-06-08 NOTE — Patient Instructions (Addendum)
Health Maintenance Due  Topic Date Due  . OPHTHALMOLOGY EXAM - Sign release of information at the check out desk for last diabetic eye exam from Dr. Gershon Crane 05/12/2018   Thanks for doing your tetanus shot today  Continue omeprazole at current dose- hopefully you can get off the nsaids from orthopedics as those can cause flare ups.   Please stop by lab before you go- hopefully bad cholesterol at least under 100- under 70 is even better. If it is over 100- perhaps trial twice a week.   Getting back into donating blood and quitting smoking will likely decrease thickness of your blood

## 2018-06-08 NOTE — Assessment & Plan Note (Signed)
S: Smoking half pack per day  A few visits ago- he has kept it down to about 7 per day or 1 pack every 3 days. Financial portion makes him want to quit.  A/P: encouraged complete cessation- he wants to continue to just work on cutting down

## 2018-06-08 NOTE — Assessment & Plan Note (Addendum)
S: Patient is doing well on omeprazole 40 mg-other than recent use of nsaids per orthopedics which are causing some breakthrough symptoms  A/P:  Stable but with some breakthrough- continue current medicines- hopefully can get off nsaids - states is calming down

## 2018-06-08 NOTE — Progress Notes (Signed)
Subjective:  Gabriel Hamilton is a 47 y.o. year old very pleasant male patient who presents for/with See problem oriented charting ROS-  still dealing with some joint pain. No chest pain or shortness of breath. No headache or blurry vision. Some constipatoin, some acide reflux  Past Medical History-  Patient Active Problem List   Diagnosis Date Noted  . Type 2 diabetes mellitus with hyperglycemia, without long-term current use of insulin ([place]) 12/18/2015    Priority: High  . Tobacco abuse 12/22/2011    Priority: High  . Hyperlipidemia, unspecified 06/06/2014    Priority: Medium  . Polycythemia 08/26/2011    Priority: Medium  . Essential hypertension 07/10/2009    Priority: Medium  . Ingrown nail 07/24/2015    Priority: Low  . 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 Metatarsal deformity 05/08/2015    Priority: Low  . Equinus deformity of foot, acquired 05/08/2015    Priority: Low  . Onychomycosis 12/26/2014    Priority: Low  . Tachycardia 02/14/2014    Priority: Low  . Acne     Priority: Low  . HYPOGONADISM 11/21/2009    Priority: Low  . GERD 03/27/2008    Priority: Low  . HEART MURMUR 03/27/2008    Priority: Low  . 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 ACCU-CHEK GUIDE test strip USE AS INSTRUCTED TO CHECK SUGAR TWICE DAILY. 200 each 2  . ampicillin (PRINCIPEN) 500 MG capsule TAKE ONE CAPSULE BY MOUTH TWICE DAILY WITH FOOD    . atorvastatin (LIPITOR) 40 MG tablet TAKE 1 TABLET BY MOUTH EVERY DAY 90 tablet 1  . BAYER MICROLET LANCETS lancets Use as instructed to check sugar twice daily 200 each 5  . dicyclomine (BENTYL) 20 MG tablet Take 1 tablet (20 mg total) by mouth 2 (two) times daily. 20 tablet 0  . glimepiride (AMARYL) 2 MG tablet TAKE 1 TABLET BY MOUTH ONE TIME DAILY BEFORE BREAKFAST    . glucose blood (BAYER CONTOUR NEXT  TEST) test strip Use as instructed to check sugar twice daily 200 each 5  . INVOKANA 100 MG TABS tablet TAKE 1 TABLET BY MOUTH EVERY DAY IN THE MORNING 90 tablet 0  . JANUVIA 100 MG tablet TAKE 1 TABLET BY MOUTH EVERY DAY 90 tablet 1  . metFORMIN (GLUCOPHAGE-XR) 500 MG 24 hr tablet TAKE 4 TABLETS (2,000 MG TOTAL) BY MOUTH DAILY WITH BREAKFAST. 360 tablet 3  . naproxen sodium (ANAPROX) 220 MG tablet USING FOR JOINT PAIN    . omeprazole (PRILOSEC) 40 MG capsule TAKE 1 CAPSULE BY MOUTH EVERY DAY 90 capsule 3  . OVER THE COUNTER MEDICATION On an antibiotic for skin-long term     No current facility-administered medications for this visit.     Objective: BP 118/82 (BP Location: Left Arm, Patient Position: Sitting, Cuff Size: Large)   Pulse (!) 104   Temp 98.2 F (36.8 C) (Oral)   Ht 5' 8.25" (1.734 m)   Wt 183 lb (83 kg)   SpO2 99%   BMI 27.62 kg/m  Gen: NAD, resting comfortably CV: RRR no murmurs rubs or gallops Lungs: CTAB no crackles, wheeze, rhonchi Abdomen: soft/nontender/nondistended/normal bowel sounds. No rebound or guarding.  Ext: no edema Skin: warm, dry  Assessment/Plan:  Other notes: 1.Patient continues to follow-up with Dr. Cherre Huger on [place], metformin, Januvia, Amaryl- last A1c was excellent in September Lab Results  Component Value Date   HGBA1C 6.0 (A) 03/09/2018   2. Updated Tdap today 3. A lot of stress moving into his new home- actually house he grew up in.  4. Weight hovering in 180s- he wants to target about 170-175. Hoping to get there by next visit- going to do some MMA sparing/training but not full contact  GERD S: Patient is doing well on omeprazole 40 mg-other than recent use of nsaids per orthopedics which are causing some breakthrough symptoms  A/P:  Stable but with some breakthrough- continue current medicines- hopefully can get off nsaids - states is calming down  Hyperlipidemia, unspecified S: Poorly controlled on last check on no  statin- we sent in atorvastatin 40 mg for him- he is taking once a week  Lab Results  Component Value Date   CHOL 176 06/09/2017   HDL 54.40 06/09/2017   LDLCALC 74 12/05/2014   LDLDIRECT 117.0 06/09/2017   TRIG 204.0 (H) 06/09/2017   CHOLHDL 3 06/09/2017   A/P: Hopefully much improved-update direct LDL today-unfortunately 1 day too early for full lipid repeat  Tobacco abuse S: Smoking half pack per day  A few visits ago- he has kept it down to about 7 per day or 1 pack every 3 days. Financial portion makes him want to quit.  A/P: encouraged complete cessation- he wants to continue to just work on cutting down  Polycythemia S: Patient with polycythemia related to smoking most likely.  He had donated blood to [facility] in the past and plan to restart last visit-he has not had time to get there yet with busy work schedule.  Obviously we have encouraged quitting smoking as well A/P: update cbc. Encouraged quit smoking. Encouraged blood donation.   Future Appointments  Date Time Provider [place]  07/13/2018  9:00 AM Philemon Kingdom, MD [facility] None  6 month physical  Lab/Order associations: Type 2 diabetes mellitus with hyperglycemia, without long-term current use of insulin ([facility])  Hyperlipidemia, unspecified hyperlipidemia type - Plan: CBC with Differential/Platelet, Comprehensive metabolic panel, LDL cholesterol, direct  Need for prophylactic vaccination with combined diphtheria-tetanus-pertussis (DTP) vaccine - Plan: Tdap vaccine greater than or equal to 7yo IM  Gastroesophageal reflux disease without esophagitis  Tobacco abuse  Polycythemia  Return precautions advised.  Garret Reddish, MD

## 2018-06-08 NOTE — Assessment & Plan Note (Signed)
S: Poorly controlled on last check on no statin- we sent in atorvastatin 40 mg for him- he is taking once a week  Lab Results  Component Value Date   CHOL 176 06/09/2017   HDL 54.40 06/09/2017   LDLCALC 74 12/05/2014   LDLDIRECT 117.0 06/09/2017   TRIG 204.0 (H) 06/09/2017   CHOLHDL 3 06/09/2017   A/P: Hopefully much improved-update direct LDL today-unfortunately 1 day too early for full lipid repeat

## 2018-06-08 NOTE — Assessment & Plan Note (Signed)
S: Patient with polycythemia related to smoking most likely.  He had donated blood to [facility] in the past and plan to restart last visit-he has not had time to get there yet with busy work schedule.  Obviously we have encouraged quitting smoking as well A/P: update cbc. Encouraged quit smoking. Encouraged blood donation.

## 2018-06-14 ENCOUNTER — Other Ambulatory Visit: Payer: Self-pay | Admitting: Internal Medicine

## 2018-07-05 ENCOUNTER — Other Ambulatory Visit: Payer: Self-pay | Admitting: Internal Medicine

## 2018-07-13 ENCOUNTER — Encounter: Payer: Self-pay | Admitting: Internal Medicine

## 2018-07-13 ENCOUNTER — Ambulatory Visit: Payer: BLUE CROSS/BLUE SHIELD | Admitting: Internal Medicine

## 2018-07-13 VITALS — BP 118/78 | HR 77 | Ht 68.25 in | Wt 183.0 lb

## 2018-07-13 DIAGNOSIS — E1165 Type 2 diabetes mellitus with hyperglycemia: Secondary | ICD-10-CM

## 2018-07-13 DIAGNOSIS — E785 Hyperlipidemia, unspecified: Secondary | ICD-10-CM

## 2018-07-13 LAB — POCT GLYCOSYLATED HEMOGLOBIN (HGB A1C): Hemoglobin A1C: 6.3 % — AB (ref 4.0–5.6)

## 2018-07-13 NOTE — Addendum Note (Signed)
Addended by: Cardell Peach I on: 07/13/2018 01:05 PM   Modules accepted: Orders

## 2018-07-13 NOTE — Progress Notes (Signed)
Patient ID: Gabriel Hamilton, male   DOB: [birth year], 48 y.o.   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 Last visit 4 months ago.  He continues on a mostly plant-based diet with some non-processed meats.  Also decreased bread intake.  Last hemoglobin A1c was: Lab Results  Component Value Date   HGBA1C 6.0 (A) 03/09/2018   HGBA1C 6.8 10/20/2017   HGBA1C 8.1 05/26/2017   HGBA1C 7.4 (H) 10/21/2016   HGBA1C 7.7 03/18/2016   HGBA1C 7.4 05/29/2015   HGBA1C 6.6 (H) 01/30/2015   HGBA1C 7.1 (H) 09/05/2014   HGBA1C 8.1 (H) 04/25/2014   HGBA1C 9.2 (H) 12/13/2013  12/18/2015: Calculated HbA1c from [facility]: 7.5%  He is on: - Metformin ER 2000 mg at dinnertime - Amaryl 4 >> 2 mg daily in am. - Invokana 100 mg before breakfast - Januvia 100 mg before breakfast He also tried [employer] >> worked well. He also tried Onglyza in the past.   Pt checks his sugars 2-4 times a day: - am:  110-153, 170, 195 >> 76-130, 157, 174 >> 89-125, 137 - 2h after b'fast: 163 >> 98-140 >> 117-141, 150 - before lunch:  130-140 >> 80-140, 150 >> 110-133, 149 - 2h after lunch: 138-160 >> 119-150, 160 >> 125-150 - before dinner:  97-136 >> 76-141 >> 106-132, 145 - 2h after dinner: 131-219 >> 154 >> 137-160 >> 135-158, 168 - bedtime: 129-168 >> 139-161, 170 >> 131-165, 171 - nighttime: n/c Lowest sugar was  97 >> 76 >> 89; he has hypoglycemia awareness in the 70s Highest sugar was 195 >> 174 >> 201 (Christmas).  Meter: AccuChek Connect   Pt's meals are: - Breakfast: velveeta - Lunch: salad, may skip - Dinner: meat (chicken, sausage, steak), veggies, starch (potatoes, rice); stews - Snack: walnuts, trailmix Drinks water.  -No CKD, last BUN/creatinine:  Lab Results  Component Value Date   BUN 10 06/08/2018   CREATININE 0.93 06/08/2018  On Avapro.  -+ HL; last set of lipids: Lab Results   Component Value Date   CHOL 176 06/09/2017   HDL 54.40 06/09/2017   LDLCALC 74 12/05/2014   LDLDIRECT 57.0 06/08/2018   TRIG 204.0 (H) 06/09/2017   CHOLHDL 3 06/09/2017  On Lipitor 40 2x a week. 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 Dr. Gershon Crane. In 09/2017, he had an eye infection >> lost vision >> was on steroid drops >> sugars higher.  Since then, his infection resolved and he regained his vision. -+ Numbness and tingling in his feet. Seeing  podiatry.  ROS: Constitutional: no weight gain/no weight loss, no fatigue, no subjective hyperthermia, no subjective hypothermia Eyes: no blurry vision, no xerophthalmia ENT: no sore throat, no nodules palpated in neck, no dysphagia, no odynophagia, no hoarseness Cardiovascular: no CP/no SOB/no palpitations/+ leg swelling Respiratory: no cough/no SOB/no wheezing Gastrointestinal: no N/no V/no D/+ C/no acid reflux Musculoskeletal: + muscle aches/+ joint aches Skin: no rashes, no hair loss Neurological: no tremors/+ numbness/+ tingling/no dizziness  I reviewed pt's medications, allergies, PMH, social hx, family hx, and changes were documented in the history of present illness. Otherwise, unchanged from my initial visit note.  Past Medical History:  Diagnosis Date  . Acne   . Diabetes mellitus   . GERD (gastroesophageal reflux disease)   . Heart murmur   . Hypertension    No past surgical history on file. Social  History   Socioeconomic History  . Marital status: Married    Spouse name: Not on file  . Number of children: Not on file  . Years of education: Not on file  . Highest education level: Not on file  Occupational History  . Not on file  Social Needs  . Financial resource strain: Not on file  . Food insecurity:    Worry: Not on file    Inability: Not on file  . Transportation needs:    Medical: Not on file    Non-medical: Not on file  Tobacco Use  . Smoking status: Current Every Day Smoker     Packs/day: 0.50    Years: 22.00    Pack years: 11.00    Types: Cigarettes  . Smokeless tobacco: Never Used  Substance and Sexual Activity  . Alcohol use: Yes    Alcohol/week: 14.0 standard drinks    Types: 14 Standard drinks or equivalent per week  . Drug use: No  . Sexual activity: Yes  Lifestyle  . Physical activity:    Days per week: Not on file    Minutes per session: Not on file  . Stress: Not on file  Relationships  . Social connections:    Talks on phone: Not on file    Gets together: Not on file    Attends religious service: Not on file    Active member of club or organization: Not on file    Attends meetings of clubs or organizations: Not on file    Relationship status: Not on file  . Intimate partner violence:    Fear of current or ex partner: Not on file    Emotionally abused: Not on file    Physically abused: Not on file    Forced sexual activity: Not on file  Other Topics Concern  . Not on file  Social History Narrative   Married for 8 years in 2015. Wife with 2 sons from previous marriage. 3 grandsons. 1 niece. Close family.       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 ACCU-CHEK GUIDE test strip USE AS INSTRUCTED TO CHECK SUGAR TWICE DAILY. 200 each 2  . ampicillin (PRINCIPEN) 500 MG capsule TAKE ONE CAPSULE BY MOUTH TWICE DAILY WITH FOOD    . atorvastatin (LIPITOR) 40 MG tablet TAKE 1 TABLET BY MOUTH EVERY DAY 90 tablet 1  . BAYER MICROLET LANCETS lancets Use as instructed to check sugar twice daily 200 each 5  . dicyclomine (BENTYL) 20 MG tablet Take 1 tablet (20 mg total) by mouth 2 (two) times daily. 20 tablet 0  . glimepiride (AMARYL) 2 MG tablet TAKE 1 TABLET BY MOUTH ONE TIME DAILY BEFORE BREAKFAST    . glimepiride (AMARYL) 4 MG tablet TAKE 1  TABLET BY MOUTH DAILY BEFORE BREAKFAST 90 tablet 3  . glucose blood (BAYER CONTOUR NEXT TEST) test strip Use as instructed to check sugar twice daily 200 each 5  . INVOKANA 100 MG TABS tablet TAKE 1 TABLET BY MOUTH EVERY DAY IN THE MORNING 90 tablet 0  . JANUVIA 100 MG tablet TAKE 1 TABLET BY MOUTH EVERY DAY 90 tablet 1  . metFORMIN (GLUCOPHAGE-XR) 500 MG 24 hr tablet TAKE 4 TABLETS (2,000 MG TOTAL) BY MOUTH DAILY WITH BREAKFAST.  360 tablet 3  . naproxen sodium (ANAPROX) 220 MG tablet USING FOR JOINT PAIN    . omeprazole (PRILOSEC) 40 MG capsule TAKE 1 CAPSULE BY MOUTH EVERY DAY 90 capsule 3  . OVER THE COUNTER MEDICATION On an antibiotic for skin-long term     No current facility-administered medications on file prior to visit.    Allergies  Allergen Reactions  . Benazepril     May have caused angioedema   Family History  Problem Relation Age of Onset  . Diabetes Father   . Heart disease Father   . Hyperlipidemia Father   . Hypertension Father   . Arthritis Mother   . Cancer Mother        optic nerve    PE: BP 118/78   Ht 5' 8.25" (1.734 m) Comment: measured  Wt 183 lb (83 kg)   BMI 27.62 kg/m  Body mass index is 27.62 kg/m. O2 Sat 98% - difficult to check 2/2 cold fingers Wt Readings from Last 3 Encounters:  07/13/18 183 lb (83 kg)  06/08/18 183 lb (83 kg)  03/09/18 184 lb (83.5 kg)   Constitutional: overweight, in NAD Eyes: PERRLA, EOMI, no exophthalmos ENT: moist mucous membranes, no thyromegaly, no cervical lymphadenopathy Cardiovascular: RRR, No MRG Respiratory: CTA B Gastrointestinal: abdomen soft, NT, ND, BS+ Musculoskeletal: no deformities, strength intact in all 4 Skin: moist, warm, no rashes Neurological: no tremor with outstretched hands, DTR normal in all 4  ASSESSMENT: 1. DM2, non-insulin-dependent, uncontrolled, with complications: - Moderate NPDR OS - PN  2. HL  3.  Overweight  PLAN:  1. Patient with longstanding, uncontrolled, type 2  diabetes, on oral medication regimen only, with improved sugars after adding Januvia and improving his diet to a predominantly plant-based one.  He reintroduced needs, but these are not highly processed.  His sugars are better but still occasionally high before meals due to snacking on fruit between meals.  At last visit we started de-escalating his regimen by first reducing the Amaryl dose. - sugars are almost all at goal with few hyperglycemic spikes during the Holidays, but now doing the Quillian Quince fast with his wife - now sugars are improved >> will try to stop Amaryl >> advised him to restart if sugars start to increase - I suggested to: Patient Instructions  Please continue: - Metformin ER 2000 mg at dinnertime - Invokana 100 mg before breakfast - Januvia 100 mg before breakfast  Please stop Amaryl.  Please come back for a follow-up appointment in 4 months.  - today, HbA1c is 6.3% (slightly higher) - continue checking sugars at different times of the day - check 1x a day, rotating checks - advised for yearly eye exams >> he is UTD - Return to clinic in 4 mo with sugar log     2. HL - Reviewed latest lipid panel from 05/2017: LDL close to goal, but worse compared to 2016; triglycerides high Lab Results  Component Value Date   CHOL 176 06/09/2017   HDL 54.40 06/09/2017   LDLCALC 74 12/05/2014   LDLDIRECT 57.0 06/08/2018   TRIG 204.0 (H) 06/09/2017   CHOLHDL 3 06/09/2017  - Continues on Lipitor once a week  3. Overweight -Continue SGLT 2 inhibitor which should also help with weight loss. -stopping Amaryl should also help - stable wt since last OV  Philemon Kingdom, MD PhD [facility] Endocrinology

## 2018-07-13 NOTE — Patient Instructions (Addendum)
Please continue: - Metformin ER 2000 mg at dinnertime - Invokana 100 mg before breakfast - Januvia 100 mg before breakfast  Please stop Amaryl.  Please come back for a follow-up appointment in 4 months.

## 2018-08-31 DIAGNOSIS — H25813 Combined forms of age-related cataract, bilateral: Secondary | ICD-10-CM | POA: Diagnosis not present

## 2018-08-31 DIAGNOSIS — H5213 Myopia, bilateral: Secondary | ICD-10-CM | POA: Diagnosis not present

## 2018-08-31 DIAGNOSIS — H52202 Unspecified astigmatism, left eye: Secondary | ICD-10-CM | POA: Diagnosis not present

## 2018-08-31 DIAGNOSIS — H524 Presbyopia: Secondary | ICD-10-CM | POA: Diagnosis not present

## 2018-08-31 DIAGNOSIS — H53001 Unspecified amblyopia, right eye: Secondary | ICD-10-CM | POA: Diagnosis not present

## 2018-08-31 DIAGNOSIS — E119 Type 2 diabetes mellitus without complications: Secondary | ICD-10-CM | POA: Diagnosis not present

## 2018-08-31 LAB — HM DIABETES EYE EXAM

## 2018-09-14 ENCOUNTER — Encounter: Payer: Self-pay | Admitting: Podiatry

## 2018-09-14 ENCOUNTER — Other Ambulatory Visit: Payer: Self-pay | Admitting: Podiatry

## 2018-09-14 ENCOUNTER — Ambulatory Visit (INDEPENDENT_AMBULATORY_CARE_PROVIDER_SITE_OTHER): Payer: BLUE CROSS/BLUE SHIELD

## 2018-09-14 ENCOUNTER — Other Ambulatory Visit: Payer: Self-pay

## 2018-09-14 ENCOUNTER — Ambulatory Visit: Payer: BLUE CROSS/BLUE SHIELD | Admitting: Podiatry

## 2018-09-14 VITALS — BP 133/92 | HR 92

## 2018-09-14 DIAGNOSIS — M659 Synovitis and tenosynovitis, unspecified: Secondary | ICD-10-CM

## 2018-09-14 DIAGNOSIS — L600 Ingrowing nail: Secondary | ICD-10-CM | POA: Diagnosis not present

## 2018-09-14 DIAGNOSIS — M65972 Unspecified synovitis and tenosynovitis, left ankle and foot: Secondary | ICD-10-CM

## 2018-09-14 DIAGNOSIS — Q6650 Congenital pes planus, unspecified foot: Secondary | ICD-10-CM

## 2018-09-14 DIAGNOSIS — G5792 Unspecified mononeuropathy of left lower limb: Secondary | ICD-10-CM

## 2018-09-14 DIAGNOSIS — M25572 Pain in left ankle and joints of left foot: Secondary | ICD-10-CM

## 2018-09-14 DIAGNOSIS — M79676 Pain in unspecified toe(s): Secondary | ICD-10-CM

## 2018-09-14 DIAGNOSIS — B351 Tinea unguium: Secondary | ICD-10-CM

## 2018-09-14 MED ORDER — DOXYCYCLINE HYCLATE 100 MG PO TABS: 100.0000 mg | ORAL_TABLET | Freq: Two times a day (BID) | ORAL | 0 refills | Status: DC

## 2018-09-14 MED ORDER — GENTAMICIN SULFATE 0.1 % EX CREA: 1.0000 "application " | TOPICAL_CREAM | Freq: Two times a day (BID) | CUTANEOUS | 1 refills | Status: DC

## 2018-09-14 MED ORDER — TERBINAFINE HCL 250 MG PO TABS: 250.0000 mg | ORAL_TABLET | Freq: Every day | ORAL | 0 refills | Status: DC

## 2018-09-14 MED ORDER — MELOXICAM 15 MG PO TABS: 15.0000 mg | ORAL_TABLET | Freq: Every day | ORAL | 1 refills | Status: DC

## 2018-09-14 NOTE — Patient Instructions (Signed)

## 2018-09-14 NOTE — Addendum Note (Signed)
Addended by: Edrick Kins on: 09/14/2018 10:08 AM   Modules accepted: Orders

## 2018-09-14 NOTE — Progress Notes (Signed)
HPI: 48 year old male otherwise healthy presents the office today as a new patient for evaluation of intermittent pain to the left lateral ankle.  He experiences this sharp shooting stabbing sensation intermittently approximately 2-3 times per month.  He states the pain will wake him up at night and he experiences sharp shooting stabbing sensation that goes away within a matter of minutes.  No known aggravating factors.  No history of trauma. Patient also presents for dystrophic thickened hardened nails.  He has a history of ingrown toenails that were treated by another podiatrist to the left great toe.  He states that all of his nails are incurvated and very thick and painful.  He presents for treatment and evaluation  Past Medical History:  Diagnosis Date   Acne    Diabetes mellitus    GERD (gastroesophageal reflux disease)    Heart murmur    Hypertension      Physical Exam: General: The patient is alert and oriented x3 in no acute distress.  Dermatology: Thickened, hyperkeratotic dystrophic nails with a pincer nail deformity noted 1-5 bilateral.  Incurvated nail with ingrowing nails medial lateral borders noted to the right hallux.  Evidence of partial nail avulsion to the medial lateral borders of the left great toe.  Skin is warm, dry and supple bilateral lower extremities. Negative for open lesions or macerations.  Vascular: Palpable pedal pulses bilaterally. No edema or erythema noted. Capillary refill within normal limits.  Neurological: Epicritic and protective threshold grossly intact bilaterally.   Musculoskeletal Exam: Range of motion within normal limits to all pedal and ankle joints bilateral. Muscle strength 5/5 in all groups bilateral.  Negative for any tenderness to palpation.  Decreased longitudinal arch noted consistent with pes planus  Radiographic Exam:  Normal osseous mineralization. Joint spaces preserved. No fracture/dislocation/boney destruction.  Rear foot  valgus with decreased calcaneal inclination angle and metatarsal declination angle noted consistent with pes planus.  Assessment: 1.  Pes planus bilateral 2.  Lateral ankle neuritis left 3.  Ingrowing nails right great toe medial and lateral 4.  Onychomycosis nails 1-5 bilateral with associated pain 5.  Left ankle synovitis/lateral impingement   Plan of Care:  1. Patient evaluated. X-Rays reviewed.  2.  Today I explained the patient sharp stabbing shooting sensations that come and go within a matter of minutes intermittently throughout the month are likely secondary to nerve impingement to the lateral aspect of the ankle.  Today we are going to dispense an ankle brace to support the ankle joint. 3.  Appointment with Liliane Channel for custom molded orthotics to help with lateral impingement 4.  Prescription for meloxicam 15 mg as needed 5.  Prescription for Lamisil 250 mg #90 daily indicated for onychomycosis 6.  Today we discussed the incurvated nail to the right hallux.  Patient opts for partial permanent nail avulsion to the medial lateral borders.  Prior to procedure 3 cc of 2% lidocaine plain was infiltrated in a hallux block fashion.  Avulsion was performed after the toe was prepped in aseptic manner and 3 x 41-[id number] applications of phenol was utilized followed by alcohol flush.  Dry sterile dressing was applied.  Postprocedural care instructions were provided as well. 7.  Prescription for doxycycline 100 mg #20 for prophylaxis 8.  Prescription for gentamicin cream 9.  Mechanical debridement of nails 1-5 was performed using a nail nipper without incident or bleeding  10.  Return to clinic in 3 weeks  *Health and safety inspector car sales @ [employer]  Edrick Kins, DPM [facility]  Dr. Edrick Kins, DPM    [address], [place] [address]                Office [phone]  Fax [phone]

## 2018-10-05 ENCOUNTER — Other Ambulatory Visit: Payer: Self-pay

## 2018-10-05 ENCOUNTER — Ambulatory Visit: Payer: BLUE CROSS/BLUE SHIELD | Admitting: Podiatry

## 2018-10-05 VITALS — Temp 97.9°F

## 2018-10-05 DIAGNOSIS — L600 Ingrowing nail: Secondary | ICD-10-CM | POA: Diagnosis not present

## 2018-10-05 DIAGNOSIS — M659 Synovitis and tenosynovitis, unspecified: Secondary | ICD-10-CM

## 2018-10-05 DIAGNOSIS — Q6650 Congenital pes planus, unspecified foot: Secondary | ICD-10-CM

## 2018-10-05 DIAGNOSIS — M65972 Unspecified synovitis and tenosynovitis, left ankle and foot: Secondary | ICD-10-CM

## 2018-10-05 DIAGNOSIS — G5792 Unspecified mononeuropathy of left lower limb: Secondary | ICD-10-CM | POA: Diagnosis not present

## 2018-10-05 NOTE — Progress Notes (Signed)
   Subjective: 48 y.o. male presents today status post permanent nail avulsion procedure of the medial lateral borders of the right great toe that was performed on 09/14/2018.  Patient has been applying Neosporin and has no new complaints.  He no longer has any pain to the right great toenail Patient also says that his left ankle is feeling much better.  The ankle brace is helping significantly.  Past Medical History:  Diagnosis Date  . Acne   . Diabetes mellitus   . GERD (gastroesophageal reflux disease)   . Heart murmur   . Hypertension     Objective: Skin is warm, dry and supple. Nail and respective nail fold appears to be healing appropriately. Open wound to the associated nail fold with a granular wound base and moderate amount of fibrotic tissue. Minimal drainage noted. Mild erythema around the periungual region likely due to phenol chemical matricectomy.  Assessment: #1 postop permanent partial nail avulsion medial lateral borders right great toe #2 open wound periungual nail fold of respective digit.  #3 lateral ankle neuritis left-improved #4 left ankle synovitis/lateral impingement-improved  Plan of care: #1 patient was evaluated  #2 debridement of open wound was performed to the periungual border of the respective toe using a currette. Antibiotic ointment and Band-Aid was applied. #3  Continue wearing ankle brace as needed  #4 patient is going to pick up his prescription for Lamisil 250 mg #90 that was prescribed last visit  #5 patient is to return to clinic on a PRN basis.   Edrick Kins, DPM [facility]  Dr. Edrick Kins, [address]                                        [place], [place] [address]                Office [phone]  Fax [phone]

## 2018-10-14 ENCOUNTER — Ambulatory Visit (INDEPENDENT_AMBULATORY_CARE_PROVIDER_SITE_OTHER): Payer: BLUE CROSS/BLUE SHIELD | Admitting: Family Medicine

## 2018-10-14 ENCOUNTER — Encounter: Payer: Self-pay | Admitting: Family Medicine

## 2018-10-14 VITALS — Temp 98.0°F | Ht 68.5 in | Wt 174.2 lb

## 2018-10-14 DIAGNOSIS — M25512 Pain in left shoulder: Secondary | ICD-10-CM | POA: Diagnosis not present
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
## 2018-10-14 NOTE — Patient Instructions (Addendum)
Health Maintenance Due  Topic Date Due  . FOOT EXAM Future in person visit 06/09/2018  . URINE MICROALBUMIN - at next in person visit 06/09/2018   Video visit

## 2018-10-19 ENCOUNTER — Other Ambulatory Visit: Payer: Self-pay | Admitting: Internal Medicine

## 2018-10-20 ENCOUNTER — Other Ambulatory Visit: Payer: Self-pay

## 2018-10-21 ENCOUNTER — Other Ambulatory Visit: Payer: Self-pay

## 2018-10-21 MED ORDER — ACCU-CHEK FASTCLIX LANCET KIT: PACK | 0 refills | Status: DC

## 2018-10-21 MED ORDER — ACCU-CHEK FASTCLIX LANCETS MISC: 11 refills | Status: DC

## 2018-11-15 ENCOUNTER — Other Ambulatory Visit: Payer: Self-pay

## 2018-11-16 ENCOUNTER — Encounter: Payer: Self-pay | Admitting: Internal Medicine

## 2018-11-16 ENCOUNTER — Other Ambulatory Visit: Payer: Self-pay | Admitting: Internal Medicine

## 2018-11-16 ENCOUNTER — Ambulatory Visit: Payer: BLUE CROSS/BLUE SHIELD | Admitting: Internal Medicine

## 2018-11-16 VITALS — BP 120/78 | HR 107 | Ht 68.5 in | Wt 179.0 lb

## 2018-11-16 DIAGNOSIS — E663 Overweight: Secondary | ICD-10-CM | POA: Diagnosis not present

## 2018-11-16 DIAGNOSIS — E785 Hyperlipidemia, unspecified: Secondary | ICD-10-CM

## 2018-11-16 DIAGNOSIS — E1165 Type 2 diabetes mellitus with hyperglycemia: Secondary | ICD-10-CM | POA: Diagnosis not present

## 2018-11-16 LAB — POCT GLYCOSYLATED HEMOGLOBIN (HGB A1C): Hemoglobin A1C: 6.5 % — AB (ref 4.0–5.6)

## 2018-11-16 MED ORDER — ACCU-CHEK FASTCLIX LANCETS MISC: 11 refills | Status: DC

## 2018-11-16 MED ORDER — ACCU-CHEK GUIDE VI STRP: ORAL_STRIP | 2 refills | Status: DC

## 2018-11-16 NOTE — Progress Notes (Signed)
Patient ID: Gabriel Hamilton, male   DOB: 15-Jan-1971, 48 y.o.   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 Last visit 4 months ago.  Last hemoglobin A1c was: Lab Results  Component Value Date   HGBA1C 6.3 (A) 07/13/2018   HGBA1C 6.0 (A) 03/09/2018   HGBA1C 6.8 10/20/2017   HGBA1C 8.1 05/26/2017   HGBA1C 7.4 (H) 10/21/2016   HGBA1C 7.7 03/18/2016   HGBA1C 7.4 05/29/2015   HGBA1C 6.6 (H) 01/30/2015   HGBA1C 7.1 (H) 09/05/2014   HGBA1C 8.1 (H) 04/25/2014  12/18/2015: Calculated HbA1c from Fructos[facility]: 7.5%  He is on: - Metformin ER 2000 mg at dinnertime - Invokana 100 mg before breakfast - Januvia 100 mg before breakfast We stopped Amaryl 06/2018. He also tried [place] >> worked well. He also tried Onglyza in the past.   Pt checks his sugars 2-4 times a day: - am: 76-130, 157, 174 >> 89-125, 137 >> 77-149, 154 - 2h after b'fast: 98-140 >> 117-141, 150 >> n/c - before lunch: 80-140, 150 >> 110-133, 149 >> 86, 106 - 2h after lunch: 119-150, 160 >> 125-150 >> n/c - before dinner: 76-141 >> 106-132, 145 >> 80-177 - 2h after dinner: 137-160 >> 135-158, 168 >> n/c - bedtime: 139-161, 170 >> 131-165, 171 >> 103-177, 201 - nighttime: n/c Lowest sugar was  97 >> 76 >> 89 >> 70; he has hypoglycemia awareness in the 70s. Highest sugar was 195 >> 174 >> 201 (Christmas) >> 201,  Meter: AccuChek Connect   Pt's meals are: - Breakfast: velveeta - Lunch: salad, may skip - Dinner: meat (chicken, sausage, steak), veggies, starch (potatoes, rice); stews - Snack: walnuts, trailmix  -No CKD, last BUN/creatinine:  Lab Results  Component Value Date   BUN 10 06/08/2018   CREATININE 0.93 06/08/2018  On April.  -+ HL; last set of lipids: Lab Results  Component Value Date   CHOL 176 06/09/2017   HDL 54.40 06/09/2017   LDLCALC 74 12/05/2014   LDLDIRECT 57.0 06/08/2018   TRIG  204.0 (H) 06/09/2017   CHOLHDL 3 06/09/2017  On Lipitor 40 once a week. 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 Dr. Gershon Crane. In 09/2017, he had an eye infection >> lost vision >> was on steroid drops >> sugars higher.  Since then, his infection resolved and he regained his vision. -+ Numbness and tingling in his feet.  He sees podiatry  ROS: Constitutional: no weight gain/+ weight loss, no fatigue, no subjective hyperthermia, no subjective hypothermia Eyes: no blurry vision, no xerophthalmia ENT: no sore throat, no nodules palpated in neck, no dysphagia, no odynophagia, no hoarseness Cardiovascular: no CP/no SOB/no palpitations/no leg swelling Respiratory: no cough/no SOB/no wheezing Gastrointestinal: no N/no V/no D/no C/no acid reflux Musculoskeletal: no muscle aches/no joint aches Skin: no rashes, no hair loss Neurological: no tremors/+ numbness/+ tingling/no dizziness  I reviewed pt's medications, allergies, PMH, social hx, family hx, and changes were documented in the history of present illness. Otherwise, unchanged from my initial visit note.  Past Medical History:  Diagnosis Date  . Acne   . Diabetes mellitus   . GERD (gastroesophageal reflux disease)   . Heart murmur   . Hypertension    No past surgical history on file. Social History   Socioeconomic History  . Marital status: Married    Spouse name: Not on file  . Number of children:  Not on file  . Years of education: Not on file  . Highest education level: Not on file  Occupational History  . Not on file  Social Needs  . Financial resource strain: Not on file  . Food insecurity:    Worry: Not on file    Inability: Not on file  . Transportation needs:    Medical: Not on file    Non-medical: Not on file  Tobacco Use  . Smoking status: Current Every Day Smoker    Packs/day: 0.50    Years: 22.00    Pack years: 11.00    Types: Cigarettes  . Smokeless tobacco: Never Used  Substance  and Sexual Activity  . Alcohol use: Yes    Alcohol/week: 14.0 standard drinks    Types: 14 Standard drinks or equivalent per week  . Drug use: No  . Sexual activity: Yes  Lifestyle  . Physical activity:    Days per week: Not on file    Minutes per session: Not on file  . Stress: Not on file  Relationships  . Social connections:    Talks on phone: Not on file    Gets together: Not on file    Attends religious service: Not on file    Active member of club or organization: Not on file    Attends meetings of clubs or organizations: Not on file    Relationship status: Not on file  . Intimate partner violence:    Fear of current or ex partner: Not on file    Emotionally abused: Not on file    Physically abused: Not on file    Forced sexual activity: Not on file  Other Topics Concern  . Not on file  Social History Narrative   Married for 8 years in 2015. Wife with 2 sons from previous marriage. 3 grandsons. 1 niece. Close family.       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 Accu-Chek FastClix Lancets MISC Use to check blood sugar once a day. 102 each 11  . ACCU-CHEK GUIDE test strip USE AS INSTRUCTED TO CHECK SUGAR TWICE DAILY. 200 each 2  . ampicillin (PRINCIPEN) 500 MG capsule TAKE ONE CAPSULE BY MOUTH TWICE DAILY WITH FOOD    . atorvastatin (LIPITOR) 40 MG tablet TAKE 1 TABLET BY MOUTH EVERY DAY 90 tablet 1  . dicyclomine (BENTYL) 20 MG tablet Take 1 tablet (20 mg total) by mouth 2 (two) times daily. 20 tablet 0  . gentamicin cream (GARAMYCIN) 0.1 % Apply 1 application topically 2 (two) times daily. 15 g 1  . glimepiride (AMARYL) 4 MG tablet TAKE 1 TABLET BY MOUTH EVERY DAY BEFORE BREAKFAST    . glucose blood (BAYER CONTOUR NEXT TEST) test strip Use as instructed to check  sugar twice daily 200 each 5  . INVOKANA 100 MG TABS tablet TAKE 1 TABLET BY MOUTH EVERY DAY IN THE MORNING 90 tablet 0  . JANUVIA 100 MG tablet TAKE 1 TABLET BY MOUTH EVERY DAY 90 tablet 1  . meloxicam (MOBIC) 15 MG tablet Take 1 tablet (15 mg total) by mouth daily. 30 tablet 1  . metFORMIN (GLUCOPHAGE-XR) 500 MG 24 hr tablet TAKE 4 TABLETS (2,000 MG TOTAL) BY MOUTH DAILY WITH BREAKFAST. 360 tablet 3  .  naproxen sodium (ANAPROX) 220 MG tablet USING FOR JOINT PAIN    . omeprazole (PRILOSEC) 40 MG capsule TAKE 1 CAPSULE BY MOUTH EVERY DAY 90 capsule 3  . OVER THE COUNTER MEDICATION On an antibiotic for skin-long term    . terbinafine (LAMISIL) 250 MG tablet Take 1 tablet (250 mg total) by mouth daily. 90 tablet 0   No current facility-administered medications on file prior to visit.    Allergies  Allergen Reactions  . Benazepril     May have caused angioedema   Family History  Problem Relation Age of Onset  . Diabetes Father   . Heart disease Father   . Hyperlipidemia Father   . Hypertension Father   . Arthritis Mother   . Cancer Mother        optic nerve    PE: BP 120/78   Pulse (!) 107   Ht 5' 8.5" (1.74 m)   Wt 179 lb (81.2 kg)   SpO2 98%   BMI 26.82 kg/m  Body mass index is 26.82 kg/m. Wt Readings from Last 3 Encounters:  11/16/18 179 lb (81.2 kg)  10/14/18 174 lb 3.2 oz (79 kg)  07/13/18 183 lb (83 kg)   Constitutional: overweight, in NAD Eyes: PERRLA, EOMI, no exophthalmos ENT: moist mucous membranes, no thyromegaly, no cervical lymphadenopathy Cardiovascular: tachycardia, RR, No MRG Respiratory: CTA B Gastrointestinal: abdomen soft, NT, ND, BS+ Musculoskeletal: no deformities, strength intact in all 4 Skin: moist, warm, no rashes Neurological: no tremor with outstretched hands, DTR normal in all 4  ASSESSMENT: 1. DM2, non-insulin-dependent, uncontrolled, with complications: - Moderate NPDR OS - PN  2. HL  3.  Overweight  PLAN:  1. Patient with  longstanding, previously uncontrolled, type 2 diabetes, on oral antidiabetic regimen, with improved sugars after adding Januvia and improving his diet to a predominantly plant-based one. Before last visit, he reintroduced meats, but not highly processed.  His sugars were only occasionally high before meals due to snacking on fruit, but otherwise they were at goal.  We were able to stop the Amaryl then. - at this visit, he has higher CBGs at almost all times of the day, but with only occasional spikes about the target range. He tells me he switched to a more keto diet, but saw sugars are higher and plans to go back to a more plant-based diet. He is also working on increasing exercise. We will not change his regimen for now. - I suggested to: Patient Instructions  Please continue: - Metformin ER 2000 mg at dinnertime - Invokana 100 mg before breakfast - Januvia 100 mg before breakfast  Please come back for a follow-up appointment in 4 months.  - today, HbA1c is 6.5% (slightly higher) - continue checking sugars at different times of the day - check 1x a day, rotating checks - advised for yearly eye exams >> he is UTD - Return to clinic in 4 mo with sugar log     2. HL - Reviewed latest lipid panel from 05/2017: LDL close to goal, but worse compared to 2016; triglycerides high Lab Results  Component Value Date   CHOL 176 06/09/2017   HDL 54.40 06/09/2017   LDLCALC 74 12/05/2014   LDLDIRECT 57.0 06/08/2018   TRIG 204.0 (H) 06/09/2017   CHOLHDL 3 06/09/2017  - Continues Lipitor once a week without side effects.  3. Overweight -Continue the SGLT 2 inhibitor which should also help with weight loss -At last visit, we were able to stop Amaryl,  which is weight inducing -He lost almost 5 pounds since last visit  Philemon Kingdom, MD PhD [facility] Endocrinology

## 2018-11-16 NOTE — Patient Instructions (Signed)
Please continue: - Metformin ER 2000 mg at dinnertime - Invokana 100 mg before breakfast - Januvia 100 mg before breakfast  Please come back for a follow-up appointment in 4 months.

## 2018-12-07 ENCOUNTER — Other Ambulatory Visit: Payer: Self-pay | Admitting: Internal Medicine

## 2018-12-14 ENCOUNTER — Other Ambulatory Visit: Payer: Self-pay

## 2018-12-14 ENCOUNTER — Encounter: Payer: Self-pay | Admitting: Family Medicine

## 2018-12-14 ENCOUNTER — Ambulatory Visit (INDEPENDENT_AMBULATORY_CARE_PROVIDER_SITE_OTHER): Payer: BC Managed Care – PPO | Admitting: Family Medicine

## 2018-12-14 VITALS — BP 120/76 | HR 74 | Temp 98.3°F | Ht 68.5 in | Wt 179.0 lb

## 2018-12-14 DIAGNOSIS — Z125 Encounter for screening for malignant neoplasm of prostate: Secondary | ICD-10-CM

## 2018-12-14 DIAGNOSIS — Z720 Tobacco use: Secondary | ICD-10-CM | POA: Diagnosis not present

## 2018-12-14 DIAGNOSIS — E785 Hyperlipidemia, unspecified: Secondary | ICD-10-CM

## 2018-12-14 DIAGNOSIS — E1169 Type 2 diabetes mellitus with other specified complication: Secondary | ICD-10-CM

## 2018-12-14 DIAGNOSIS — E1165 Type 2 diabetes mellitus with hyperglycemia: Secondary | ICD-10-CM | POA: Diagnosis not present

## 2018-12-14 DIAGNOSIS — Z0000 Encounter for general adult medical examination without abnormal findings: Secondary | ICD-10-CM | POA: Diagnosis not present

## 2018-12-14 DIAGNOSIS — D751 Secondary polycythemia: Secondary | ICD-10-CM

## 2018-12-14 DIAGNOSIS — E1159 Type 2 diabetes mellitus with other circulatory complications: Secondary | ICD-10-CM

## 2018-12-14 DIAGNOSIS — E663 Overweight: Secondary | ICD-10-CM

## 2018-12-14 DIAGNOSIS — I10 Essential (primary) hypertension: Secondary | ICD-10-CM

## 2018-12-14 DIAGNOSIS — Z1211 Encounter for screening for malignant neoplasm of colon: Secondary | ICD-10-CM

## 2018-12-14 DIAGNOSIS — I152 Hypertension secondary to endocrine disorders: Secondary | ICD-10-CM

## 2018-12-14 LAB — COMPREHENSIVE METABOLIC PANEL
ALT: 16 U/L (ref 0–53)
AST: 23 U/L (ref 0–37)
Albumin: 4 g/dL (ref 3.5–5.2)
Alkaline Phosphatase: 48 U/L (ref 39–117)
BUN: 12 mg/dL (ref 6–23)
CO2: 29 mEq/L (ref 19–32)
Calcium: 9.3 mg/dL (ref 8.4–10.5)
Chloride: 99 mEq/L (ref 96–112)
Creatinine, Ser: 0.93 mg/dL (ref 0.40–1.50)
GFR: 104.39 mL/min (ref >60.00)
Glucose, Bld: 71 mg/dL (ref 70–99)
Potassium: 4.5 mEq/L (ref 3.5–5.1)
Sodium: 139 mEq/L (ref 135–145)
Total Bilirubin: 1 mg/dL (ref 0.2–1.2)
Total Protein: 6.6 g/dL (ref 6.0–8.3)

## 2018-12-14 LAB — MICROALBUMIN / CREATININE URINE RATIO
Creatinine,U: 182 mg/dL
Microalb Creat Ratio: 1.3 mg/g (ref 0.0–30.0)
Microalb, Ur: 2.4 mg/dL — ABNORMAL HIGH (ref 0.0–1.9)

## 2018-12-14 LAB — LIPID PANEL
Cholesterol: 146 mg/dL (ref 0–200)
HDL: 60 mg/dL (ref >39.00)
LDL Cholesterol: 57 mg/dL (ref 0–99)
NonHDL: 85.93
Total CHOL/HDL Ratio: 2
Triglycerides: 146 mg/dL (ref 0.0–149.0)
VLDL: 29.2 mg/dL (ref 0.0–40.0)

## 2018-12-14 LAB — CBC
HCT: 53.9 % — ABNORMAL HIGH (ref 39.0–52.0)
Hemoglobin: 17.8 g/dL — ABNORMAL HIGH (ref 13.0–17.0)
MCHC: 33 g/dL (ref 30.0–36.0)
MCV: 95.7 fl (ref 78.0–100.0)
Platelets: 218 10*3/uL (ref 150.0–400.0)
RBC: 5.63 Mil/uL (ref 4.22–5.81)
RDW: 16.4 % — ABNORMAL HIGH (ref 11.5–15.5)
WBC: 4.8 10*3/uL (ref 4.0–10.5)

## 2018-12-14 LAB — PSA: PSA: 0.75 ng/mL (ref 0.10–4.00)

## 2018-12-14 MED ORDER — ATORVASTATIN CALCIUM 40 MG PO TABS: 40.0000 mg | ORAL_TABLET | ORAL | 3 refills | Status: DC

## 2018-12-14 NOTE — Patient Instructions (Addendum)
Health Maintenance Due  Topic Date Due  . URINE MICROALBUMIN - today 06/09/2018   Try to bump exercise to 150 minutes a week- you sound really close already  Please stop by lab before you go If you do not have mychart- we will call you about results within 5 business days of [place] receiving them.  If you have mychart- we will send your results within 3 business days of [place] receiving them.  If abnormal or we want to clarify a result, we will call or mychart you to make sure you receive the message.  If you have questions or concerns or don't hear within 5-7 days, please send [place] a message or call us.

## 2018-12-14 NOTE — Progress Notes (Signed)
Phone: [phone]    Subjective:  Patient presents today for their annual physical. Chief complaint-noted.   See problem oriented charting- ROS- full  review of systems was completed and negative except for: some constipation, occasional leg swelling, joint pain- ankle  The following were reviewed and entered/updated in epic: Past Medical History:  Diagnosis Date  . Acne   . Diabetes mellitus   . GERD (gastroesophageal reflux disease)   . Heart murmur   . Hypertension    Patient Active Problem List   Diagnosis Date Noted  . Type 2 diabetes mellitus with hyperglycemia, without long-term current use of insulin ([place]) 12/18/2015    Priority: High  . Tobacco abuse 12/22/2011    Priority: High  . Hyperlipidemia, unspecified 06/06/2014    Priority: Medium  . Polycythemia 08/26/2011    Priority: Medium  . Essential hypertension 07/10/2009    Priority: Medium  . Ingrown nail 07/24/2015    Priority: Low  . 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 Metatarsal deformity 05/08/2015    Priority: Low  . Equinus deformity of foot, acquired 05/08/2015    Priority: Low  . Onychomycosis 12/26/2014    Priority: Low  . Tachycardia 02/14/2014    Priority: Low  . Acne     Priority: Low  . HYPOGONADISM 11/21/2009    Priority: Low  . GERD 03/27/2008    Priority: Low  . HEART MURMUR 03/27/2008    Priority: Low  . Overweight 11/16/2018  . 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 No pertinent surgical history.  Family History  Problem Relation Age of Onset  . Diabetes Father   . Heart disease Father   . Hyperlipidemia Father   . Hypertension Father   . Arthritis Mother   . Cancer Mother        optic nerve    Medications- reviewed and updated Current Outpatient Medications  Medication Sig Dispense Refill  . Accu-Chek FastClix Lancets MISC USE TO CHECK BLOOD SUGAR TWICE DAILY 204 each 0  . ACCU-CHEK GUIDE test strip Use  as instructed 200 each 2  . atorvastatin (LIPITOR) 40 MG tablet Take 1 tablet (40 mg total) by mouth once a week. 13 tablet 3  . dicyclomine (BENTYL) 20 MG tablet Take 1 tablet (20 mg total) by mouth 2 (two) times daily. 20 tablet 0  . gentamicin cream (GARAMYCIN) 0.1 % Apply 1 application topically 2 (two) times daily. 15 g 1  . glimepiride (AMARYL) 4 MG tablet TAKE 1 TABLET BY MOUTH EVERY DAY BEFORE BREAKFAST    . INVOKANA 100 MG TABS tablet TAKE 1 TABLET BY MOUTH EVERY DAY IN THE MORNING 90 tablet 0  . JANUVIA 100 MG tablet TAKE 1 TABLET BY MOUTH EVERY DAY 90 tablet 1  . meloxicam (MOBIC) 15 MG tablet Take 1 tablet (15 mg total) by mouth daily. 30 tablet 1  . metFORMIN (GLUCOPHAGE-XR) 500 MG 24 hr tablet TAKE 4 TABLETS (2,000 MG TOTAL) BY MOUTH DAILY WITH BREAKFAST. 360 tablet 3  . naproxen sodium (ANAPROX) 220 MG tablet USING FOR JOINT PAIN    . omeprazole (PRILOSEC) 40 MG capsule TAKE 1 CAPSULE BY MOUTH EVERY DAY 90 capsule 3  . OVER THE COUNTER MEDICATION On an antibiotic for skin-long term    . terbinafine (LAMISIL) 250 MG tablet Take 1 tablet (250 mg total) by mouth daily. 90 tablet 0   No current facility-administered medications for this visit.     Allergies-reviewed  and updated Allergies  Allergen Reactions  . Benazepril     May have caused angioedema    Social History   Social History Narrative   Married for 8 years in 2015. Wife with 2 sons from previous marriage. 3 grandsons. 1 niece. Close family.       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 Oropharynx normal Neck: no  thyromegaly or cervical lympahdenopathy CV: RRR no murmurs rubs or gallops Lungs: CTAB no crackles, wheeze, rhonchi Abdomen: soft/nontender/nondistended/normal bowel sounds. No rebound or guarding.  Ext: no edema Skin: warm, dry Neuro: grossly normal, moves all extremities, PERRLA   Diabetic Foot Exam - Simple   Simple Foot Form Diabetic Foot exam was performed with the following findings: Yes 12/14/2018  8:19 AM  Visual Inspection No deformities, no ulcerations, no other skin breakdown bilaterally: Yes Sensation Testing Intact to touch and monofilament testing bilaterally: Yes Pulse Check Posterior Tibialis and Dorsalis pulse intact bilaterally: Yes Comments Does have onychomycosis        Assessment and Plan:  48 y.o. male presenting for annual physical.  Health Maintenance counseling: 1. Anticipatory guidance: Patient counseled regarding regular dental exams -q6 months, eye exams - yearly,  avoiding smoking and second hand smoke- encouraged cessatoin , limiting alcohol to 2 beverages per day.   2. Risk factor reduction:  Advised patient of need for regular exercise and diet rich and fruits and vegetables to reduce risk of heart attack and stroke. Exercise- active around home- recommended 150 minutes (hes up to 120 right now)exercise per week- his [employer] stuff and [employer] options are not working. Diet-trying to eat reasonably healthy- Dr. Cruzita Lederer wants him more plant based but he loves grilling out.  Wt Readings from Last 3 Encounters:  12/14/18 179 lb (81.2 kg)  11/16/18 179 lb (81.2 kg)  10/14/18 174 lb 3.2 oz (79 kg)  3. Immunizations/screenings/ancillary studies- up to date  Immunization History  Administered Date(s) Administered  . Pneumococcal Polysaccharide-23 06/06/2008, 02/14/2014  . Td 03/22/2008  . Tdap 06/08/2018  4. Prostate cancer screening- PSA today- trend has been low risk.   Lab Results  Component Value Date   PSA 0.82 06/09/2017   PSA 0.66 06/26/2015   PSA 0.56  08/23/2013   5. Colon cancer screening - no family history. He is aware of some guidelines shifting to age 29 and would prefer to go ahead. We placed referral and he will check with insurance 6. Skin cancer screening/prevention- lower risk due to level of melanin. advised regular sunscreen use. Denies worrisome, changing, or new skin lesions.  7. Testicular cancer screening- advised monthly self exams  8. STD screening- patient opts out as monogomous 9. current smoker- will get ua  Status of chronic or acute concerns   Diabetes follows with Dr. Cruzita Lederer. On invokana, metformin, januvia, amaryl.  Lab Results  Component Value Date   HGBA1C 6.5 (A) 11/16/2018   Hypertriglyceridemia in past- had been on atorvastatin in the past- was doing once a week but  stopped a few months ago - updated lipids and likely restart statin.   GERD- on prilosec 40mg - failed 20mg . Does take b12 Lab Results  Component Value Date   [id number] 06/09/2017   Tobacco abuse- 1/2 PPD. Advised cessation -hes not ready. Secondary polycythemia as a result. Had been donating to [facility] regularly - got as low as 17.   HTN- controlled without BP meds since being on invokana.   AC joint pain on left improved after last visit  Uses meloxicam for his ankles- reportedly podiatry stated more nerve related issues. Using ankle brace at times too  Being treated by podiatry for onychomycosis   Doing ok with covid 19- doing a lot at house, missing travel. Still able to work.   No problem-specific Assessment & Plan notes found for this encounter.   Future Appointments  Date Time Provider [place]  03/22/2019  9:15 AM Philemon Kingdom, MD [facility] None   No follow-ups on file.  Lab/Order associations: fasting    ICD-10-CM   1. Preventative health care  Z00.00 CBC    Comprehensive metabolic panel    Lipid panel    Hemoglobin A1c    PSA    Microalbumin / creatinine urine ratio  2. Type 2 diabetes  mellitus with hyperglycemia, without long-term current use of insulin (HCC)  E11.65 CBC    Comprehensive metabolic panel    Lipid panel    Hemoglobin A1c    Microalbumin / creatinine urine ratio  3. Tobacco abuse  Z72.0   4. Hyperlipidemia associated with type 2 diabetes mellitus ([place])  E11.69    E78.5   5. Hypertension associated with diabetes ([place])  E11.59    I10   6. Polycythemia  D75.1   7. Screening for prostate cancer  Z12.5 PSA    Meds ordered this encounter  Medications  . atorvastatin (LIPITOR) 40 MG tablet    Sig: Take 1 tablet (40 mg total) by mouth once a week.    Dispense:  13 tablet    Refill:  3    Return precautions advised.   Garret Reddish, MD

## 2018-12-20 ENCOUNTER — Other Ambulatory Visit: Payer: Self-pay | Admitting: Family Medicine

## 2018-12-20 ENCOUNTER — Other Ambulatory Visit: Payer: Self-pay | Admitting: Internal Medicine

## 2018-12-23 ENCOUNTER — Other Ambulatory Visit: Payer: Self-pay | Admitting: Internal Medicine

## 2019-01-29 ENCOUNTER — Other Ambulatory Visit: Payer: Self-pay | Admitting: Internal Medicine

## 2019-02-22 DIAGNOSIS — H2513 Age-related nuclear cataract, bilateral: Secondary | ICD-10-CM | POA: Diagnosis not present

## 2019-02-22 DIAGNOSIS — E119 Type 2 diabetes mellitus without complications: Secondary | ICD-10-CM | POA: Diagnosis not present

## 2019-02-22 DIAGNOSIS — H53001 Unspecified amblyopia, right eye: Secondary | ICD-10-CM | POA: Diagnosis not present

## 2019-02-22 DIAGNOSIS — H1820 Unspecified corneal edema: Secondary | ICD-10-CM | POA: Diagnosis not present

## 2019-03-01 ENCOUNTER — Ambulatory Visit (INDEPENDENT_AMBULATORY_CARE_PROVIDER_SITE_OTHER): Payer: BLUE CROSS/BLUE SHIELD | Admitting: Podiatry

## 2019-03-01 ENCOUNTER — Encounter: Payer: Self-pay | Admitting: Podiatry

## 2019-03-01 ENCOUNTER — Other Ambulatory Visit: Payer: Self-pay

## 2019-03-01 DIAGNOSIS — B351 Tinea unguium: Secondary | ICD-10-CM

## 2019-03-01 DIAGNOSIS — E0843 Diabetes mellitus due to underlying condition with diabetic autonomic (poly)neuropathy: Secondary | ICD-10-CM

## 2019-03-01 DIAGNOSIS — M79676 Pain in unspecified toe(s): Secondary | ICD-10-CM

## 2019-03-01 DIAGNOSIS — G5792 Unspecified mononeuropathy of left lower limb: Secondary | ICD-10-CM

## 2019-03-01 MED ORDER — GABAPENTIN 100 MG PO CAPS: 100.0000 mg | ORAL_CAPSULE | Freq: Three times a day (TID) | ORAL | 0 refills | Status: DC

## 2019-03-04 NOTE — Progress Notes (Signed)
   SUBJECTIVE Patient with a history of diabetes mellitus presents to office today complaining of elongated, thickened nails that cause pain while ambulating in shoes. He is unable to trim his own nails.  He also states the neuritis of the left foot has recurred. He reports associated pain that radiates up his LLE and numbness in the toes. He has not done anything for treatment and denies modifying factors. Patient is here for further evaluation and treatment.   Past Medical History:  Diagnosis Date  . Acne   . Diabetes mellitus   . GERD (gastroesophageal reflux disease)   . Heart murmur   . Hypertension     OBJECTIVE General Patient is awake, alert, and oriented x 3 and in no acute distress. Derm Skin is dry and supple bilateral. Negative open lesions or macerations. Remaining integument unremarkable. Nails are tender, long, thickened and dystrophic with subungual debris, consistent with onychomycosis, 1-5 bilateral. No signs of infection noted. Vasc  DP and PT pedal pulses palpable bilaterally. Temperature gradient within normal limits.  Neuro Epicritic and protective threshold sensation diminished bilaterally.  Musculoskeletal Exam No symptomatic pedal deformities noted bilateral. Muscular strength within normal limits.  ASSESSMENT 1. Diabetes Mellitus w/ peripheral neuropathy 2. Onychomycosis of nail due to dermatophyte bilateral 3. Neuritis left lateral leg  PLAN OF CARE 1. Patient evaluated today. 2. Instructed to maintain good pedal hygiene and foot care. Stressed importance of controlling blood sugar.  3. Mechanical debridement of nails 1-5 bilaterally performed using a nail nipper. Filed with dremel without incident.  4. Prescription for Gabapentin 100 mg TID provided to patient.  5. Return to clinic in 4 weeks.     Edrick Kins, DPM [facility]  Dr. Edrick Kins, [place]                                        [place], [place]  [address]                Office [phone]  Fax [phone]

## 2019-03-22 ENCOUNTER — Encounter: Payer: Self-pay | Admitting: Internal Medicine

## 2019-03-22 ENCOUNTER — Other Ambulatory Visit: Payer: Self-pay

## 2019-03-22 ENCOUNTER — Ambulatory Visit: Payer: BC Managed Care – PPO | Admitting: Internal Medicine

## 2019-03-22 VITALS — BP 110/60 | HR 85 | Ht 68.5 in | Wt 183.0 lb

## 2019-03-22 DIAGNOSIS — E663 Overweight: Secondary | ICD-10-CM | POA: Diagnosis not present

## 2019-03-22 DIAGNOSIS — H168 Other keratitis: Secondary | ICD-10-CM | POA: Diagnosis not present

## 2019-03-22 DIAGNOSIS — H1820 Unspecified corneal edema: Secondary | ICD-10-CM | POA: Diagnosis not present

## 2019-03-22 DIAGNOSIS — B309 Viral conjunctivitis, unspecified: Secondary | ICD-10-CM | POA: Diagnosis not present

## 2019-03-22 DIAGNOSIS — E785 Hyperlipidemia, unspecified: Secondary | ICD-10-CM

## 2019-03-22 DIAGNOSIS — H2513 Age-related nuclear cataract, bilateral: Secondary | ICD-10-CM | POA: Diagnosis not present

## 2019-03-22 DIAGNOSIS — E1165 Type 2 diabetes mellitus with hyperglycemia: Secondary | ICD-10-CM

## 2019-03-22 LAB — POCT GLYCOSYLATED HEMOGLOBIN (HGB A1C): Hemoglobin A1C: 6.1 % — AB (ref 4.0–5.6)

## 2019-03-22 NOTE — Progress Notes (Signed)
Patient ID: Gabriel Hamilton, male   DOB: 02-Feb-1971, 48 y.o.   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 Last visit 4 months ago.  Since last visit, he started Neurontin for neuropathy-by his podiatrist and also start the steroid eye drops.  In the last few days, sugars were a little higher.  Reviewed HbA1c levels: Lab Results  Component Value Date   HGBA1C 6.5 (A) 11/16/2018   HGBA1C 6.3 (A) 07/13/2018   HGBA1C 6.0 (A) 03/09/2018   HGBA1C 6.8 10/20/2017   HGBA1C 8.1 05/26/2017   HGBA1C 7.4 (H) 10/21/2016   HGBA1C 7.7 03/18/2016   HGBA1C 7.4 05/29/2015   HGBA1C 6.6 (H) 01/30/2015   HGBA1C 7.1 (H) 09/05/2014  12/18/2015: Calculated HbA1c from [facility]: 7.5%  He is on: - Metformin ER 2000 mg at dinnertime - Invokana 100 mg before b'fast - Januvia 100 mg before b'fast - Amaryl 2 mg before b'fast -he did not remember to stop this in 06/2018. He also tried [place] >> worked well. He also tried Onglyza in the past.   Pt checks his sugars 2-4 times a day: - am: 89-125, 137 >> 77-149, 154 >> 73-120, 136, 152 - 2h after b'fast: 98-140 >> 117-141, 150 >> n/c >> 119-140 - before lunch: 110-133, 149 >> 86, 106 >> 80-140, 150 - 2h after lunch: 119-150, 160 >> 125-150 >> n/c >> 139-150 - before dinner: 106-132, 145 >> 80-177 >> 53, 76-140, 145 - 2h after dinner: 137-160 >> 135-158, 168 >> n/c >> 137-163 - bedtime: 131-165, 171 >> 103-177, 201 >> 99-152, 169, 172 - nighttime: n/c Lowest sugar was  70 >> 53 (skipped lunch); he has hypoglycemia awareness in the 70s. Highest sugar was  201 >> 181 (beer)  Meter: AccuChek Connect   Pt's meals are: - Breakfast: velveeta - Lunch: salad, may skip, bread - Dinner: meat (chicken, sausage, steak), veggies,; stews - Snack: walnuts, trailmix  -No CKD, last BUN/creatinine:  Lab Results  Component Value Date   BUN 12  12/14/2018   CREATININE 0.93 12/14/2018  Not on an ACE inhibitor/ARB.  -+ HL; last set of lipids: Lab Results  Component Value Date   CHOL 146 12/14/2018   HDL 60.00 12/14/2018   LDLCALC 57 12/14/2018   LDLDIRECT 57.0 06/08/2018   TRIG 146.0 12/14/2018   CHOLHDL 2 12/14/2018  On Lipitor 40 once a week. -Last eye exam was in 04/2018: + Moderate NPDR OS. Dr. Gershon Crane. Seeing retina specialist today.  Restarted steroid drops. In 09/2017, he had an eye infection >> lost vision >> was on steroid drops >> sugars higher.  She regained his vision afterwards. -He has numbness and tingling in his feet.  He sees podiatry. Started Neurontin 100 mg 3x a day.  ROS: Constitutional: + weight gain/no weight loss, no fatigue, no subjective hyperthermia, no subjective hypothermia Eyes: no blurry vision, no xerophthalmia ENT: no sore throat, no nodules palpated in neck, no dysphagia, no odynophagia, no hoarseness Cardiovascular: no CP/no SOB/no palpitations/no leg swelling Respiratory: no cough/no SOB/no wheezing Gastrointestinal: no N/no V/no D/no C/no acid reflux Musculoskeletal: no muscle aches/no joint aches Skin: no rashes, no hair loss Neurological: no tremors/+ numbness/+ tingling/no dizziness  I reviewed pt's medications, allergies, PMH, social hx, family hx, and changes were documented in the history of present illness. Otherwise, unchanged from my initial visit note.  Past Medical History:  Diagnosis Date  . Acne   .  Diabetes mellitus   . GERD (gastroesophageal reflux disease)   . Heart murmur   . Hypertension    No past surgical history on file. Social History   Socioeconomic History  . Marital status: Married    Spouse name: Not on file  . Number of children: Not on file  . Years of education: Not on file  . Highest education level: Not on file  Occupational History  . Not on file  Social Needs  . Financial resource strain: Not on file  . Food insecurity    Worry: Not on  file    Inability: Not on file  . Transportation needs    Medical: Not on file    Non-medical: Not on file  Tobacco Use  . Smoking status: Current Every Day Smoker    Packs/day: 0.50    Years: 22.00    Pack years: 11.00    Types: Cigarettes  . Smokeless tobacco: Never Used  Substance and Sexual Activity  . Alcohol use: Yes    Alcohol/week: 14.0 standard drinks    Types: 14 Standard drinks or equivalent per week  . Drug use: No  . Sexual activity: Yes  Lifestyle  . Physical activity    Days per week: Not on file    Minutes per session: Not on file  . Stress: Not on file  Relationships  . Social Herbalist on phone: Not on file    Gets together: Not on file    Attends religious service: Not on file    Active member of club or organization: Not on file    Attends meetings of clubs or organizations: Not on file    Relationship status: Not on file  . Intimate partner violence    Fear of current or ex partner: Not on file    Emotionally abused: Not on file    Physically abused: Not on file    Forced sexual activity: Not on file  Other Topics Concern  . Not on file  Social History Narrative   Married for 8 years in 2015. Wife with 2 sons from previous marriage. 3 grandsons. 1 niece. Close family.       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 Accu-Chek FastClix Lancets MISC USE TO CHECK BLOOD SUGAR TWICE DAILY 204 each 0  . ACCU-CHEK GUIDE test strip USE AS INSTRUCTED TO CHECK SUGAR TWICE DAILY. 100 strip 5  . ampicillin (PRINCIPEN) 500 MG capsule TAKE 1 CAPSULE BY MOUTH TWICE A DAY WITH FOOD    . atorvastatin (LIPITOR) 40 MG tablet Take 1 tablet (40 mg total) by mouth once a week. 13 tablet 3  . dicyclomine (BENTYL) 20 MG tablet Take 1 tablet (20 mg total) by  mouth 2 (two) times daily. 20 tablet 0  . gabapentin (NEURONTIN) 100 MG capsule Take 1 capsule (100 mg total) by mouth 3 (three) times daily. 90 capsule 0  . gentamicin cream (GARAMYCIN) 0.1 % Apply 1 application topically 2 (two) times daily. 15 g 1  . glimepiride (AMARYL) 4 MG tablet TAKE 1 TABLET BY MOUTH EVERY DAY BEFORE BREAKFAST    . INVOKANA 100 MG TABS tablet TAKE 1 TABLET BY MOUTH EVERY DAY IN THE MORNING 90 tablet 0  . JANUVIA 100  MG tablet TAKE 1 TABLET BY MOUTH EVERY DAY 90 tablet 1  . meloxicam (MOBIC) 15 MG tablet Take 1 tablet (15 mg total) by mouth daily. 30 tablet 1  . metFORMIN (GLUCOPHAGE-XR) 500 MG 24 hr tablet TAKE 4 TABLETS (2,000 MG TOTAL) BY MOUTH DAILY WITH BREAKFAST. 360 tablet 3  . naproxen sodium (ANAPROX) 220 MG tablet USING FOR JOINT PAIN    . omeprazole (PRILOSEC) 40 MG capsule TAKE 1 CAPSULE BY MOUTH EVERY DAY 90 capsule 1  . OVER THE COUNTER MEDICATION On an antibiotic for skin-long term    . terbinafine (LAMISIL) 250 MG tablet Take 1 tablet (250 mg total) by mouth daily. 90 tablet 0   No current facility-administered medications on file prior to visit.    Allergies  Allergen Reactions  . Benazepril     May have caused angioedema   Family History  Problem Relation Age of Onset  . Diabetes Father   . Heart disease Father   . Hyperlipidemia Father   . Hypertension Father   . Arthritis Mother   . Cancer Mother        optic nerve    PE: BP 110/60   Pulse 85   Ht 5' 8.5" (1.74 m) Comment: measured  Wt 183 lb (83 kg)   SpO2 99%   BMI 27.42 kg/m  Body mass index is 27.42 kg/m. Wt Readings from Last 3 Encounters:  03/22/19 183 lb (83 kg)  12/14/18 179 lb (81.2 kg)  11/16/18 179 lb (81.2 kg)   Constitutional: overweight, in NAD Eyes: PERRLA, EOMI, no exophthalmos ENT: moist mucous membranes, no thyromegaly, no cervical lymphadenopathy Cardiovascular: RRR, No MRG Respiratory: CTA B Gastrointestinal: abdomen soft, NT, ND, BS+ Musculoskeletal:  no deformities, strength intact in all 4 Skin: moist, warm, no rashes Neurological: no tremor with outstretched hands, DTR normal in all 4  ASSESSMENT: 1. DM2, non-insulin-dependent, uncontrolled, with complications: - Moderate NPDR OS - PN  2. HL  3.  Overweight  PLAN:  1. Patient with longstanding, previously uncontrolled, type 2 diabetes, on oral antidiabetic regimen, with improved sugars after adding Januvia and improving his diet.  He is now eating some meat, but not highly processed.  At last visit, sugars were higher at all times of the day with occasional spikes above the target range but at that time he was using a more keto diet and since then she switched back to a more plant-based diet.  She was also working on increased exercise.  We did not change his regimen then. -At this visit, sugars are mostly at goal, with occasional hyperglycemic spikes, especially after starting steroid eyedrops.  She also had a low blood sugar, in the 60s, before dinner, after a particularly busy day for which she is skipped lunch.  However, overall, the sugars appear well controlled so I suggested to continue the current regimen for now.  Of note, he is still taking Amaryl 2 mg before breakfast (he did not remember to stop it), but we will continue this for now especially with the holidays coming up. - I suggested to: Patient Instructions  Please continue: - Metformin ER 2000 mg at dinnertime - Invokana 100 mg before b'fast - Januvia 100 mg before b'fast - Amaryl 2 mg before b'fast  Please come back for a follow-up appointment in 4 months.  - we checked his HbA1c: 6.1% (lower) - advised to check sugars at different times of the day - 1x a day, rotating check times - advised for  yearly eye exams >> he is UTD - refuses flu shot - return to clinic in 4 months     2. HL -Reviewed latest lipid panel from 3 months ago: At goal Lab Results  Component Value Date   CHOL 146 12/14/2018   HDL 60.00  12/14/2018   LDLCALC 57 12/14/2018   LDLDIRECT 57.0 06/08/2018   TRIG 146.0 12/14/2018   CHOLHDL 2 12/14/2018  -Continues Lipitor once a week without side effects  3. Overweight -Continue SGLT 2 inhibitor which should also help with weight loss -He lost 5 pounds before last visit, now gained 4 lbs-likely due to Neurontin  Philemon Kingdom, MD PhD [facility] Endocrinology

## 2019-03-22 NOTE — Patient Instructions (Addendum)
Please continue: - Metformin ER 2000 mg at dinnertime - Invokana 100 mg before b'fast - Januvia 100 mg before b'fast - Amaryl 2 mg before b'fast  Please come back for a follow-up appointment in 4 months.

## 2019-03-22 NOTE — Addendum Note (Signed)
Addended by: Cardell Peach I on: 03/22/2019 09:50 AM   Modules accepted: Orders

## 2019-03-29 ENCOUNTER — Other Ambulatory Visit: Payer: Self-pay

## 2019-03-29 ENCOUNTER — Ambulatory Visit (INDEPENDENT_AMBULATORY_CARE_PROVIDER_SITE_OTHER): Payer: BC Managed Care – PPO | Admitting: Podiatry

## 2019-03-29 ENCOUNTER — Encounter: Payer: Self-pay | Admitting: Podiatry

## 2019-03-29 DIAGNOSIS — E0843 Diabetes mellitus due to underlying condition with diabetic autonomic (poly)neuropathy: Secondary | ICD-10-CM

## 2019-03-29 DIAGNOSIS — G5792 Unspecified mononeuropathy of left lower limb: Secondary | ICD-10-CM

## 2019-04-02 NOTE — Progress Notes (Signed)
   SUBJECTIVE 48 year old male presenting today for follow up evaluation of neuritis of the left lateral leg. He states he is doing well but his symptoms have not greatly improved. He states the Gabapentin was working really well for the first week or so but it is not helping as much now. He denies any worsening factors. Patient is here for further evaluation and treatment.   Past Medical History:  Diagnosis Date  . Acne   . Diabetes mellitus   . GERD (gastroesophageal reflux disease)   . Heart murmur   . Hypertension     OBJECTIVE General Patient is awake, alert, and oriented x 3 and in no acute distress. Derm Skin is dry and supple bilateral. Negative open lesions or macerations. Remaining integument unremarkable. No signs of infection noted. Vasc  DP and PT pedal pulses palpable bilaterally. Temperature gradient within normal limits.  Neuro Epicritic and protective threshold sensation diminished bilaterally.  Musculoskeletal Exam No symptomatic pedal deformities noted bilateral. Muscular strength within normal limits.  ASSESSMENT 1. Diabetes Mellitus w/ peripheral neuropathy 2. Neuritis left lateral leg  PLAN OF CARE 1. Patient evaluated today. 2. Injection of 0.5 mLs Celestone Soluspan injected into the lateral left leg.  3. Continue taking Gabapentin 100 mg three times daily.  4. Appointment with Liliane Channel, Pedorthist, for custom molded orthotics.  5. Return to clinic in 4 weeks.      Edrick Kins, DPM [facility]  Dr. Edrick Kins, [address]                                        [place], [place] [address]                Office [phone]  Fax [phone]

## 2019-04-20 ENCOUNTER — Other Ambulatory Visit: Payer: BC Managed Care – PPO | Admitting: Orthotics

## 2019-04-26 ENCOUNTER — Ambulatory Visit: Payer: BC Managed Care – PPO | Admitting: Podiatry

## 2019-06-09 ENCOUNTER — Other Ambulatory Visit: Payer: Self-pay | Admitting: Internal Medicine

## 2019-06-23 ENCOUNTER — Other Ambulatory Visit: Payer: Self-pay | Admitting: Family Medicine

## 2019-07-02 ENCOUNTER — Other Ambulatory Visit: Payer: Self-pay | Admitting: Internal Medicine

## 2019-07-25 ENCOUNTER — Other Ambulatory Visit: Payer: Self-pay | Admitting: Internal Medicine

## 2019-08-02 ENCOUNTER — Ambulatory Visit: Payer: BC Managed Care – PPO | Admitting: Internal Medicine

## 2019-09-10 ENCOUNTER — Other Ambulatory Visit: Payer: Self-pay | Admitting: Internal Medicine

## 2019-09-11 ENCOUNTER — Other Ambulatory Visit: Payer: Self-pay | Admitting: Internal Medicine

## 2019-09-11 NOTE — Telephone Encounter (Signed)
Please advise as to which medication to change to, [place] or [place]?

## 2019-09-11 NOTE — Telephone Encounter (Signed)
Pharmacy sent a note that Gabriel Hamilton is not covered and the other options are [place] or [place].

## 2019-09-11 NOTE — Telephone Encounter (Signed)
M, I do not understand.Marland KitchenMarland Kitchen

## 2019-09-30 ENCOUNTER — Ambulatory Visit: Payer: Self-pay | Attending: Internal Medicine

## 2019-09-30 DIAGNOSIS — Z23 Encounter for immunization: Secondary | ICD-10-CM

## 2019-09-30 NOTE — Progress Notes (Signed)
   Covid-19 Vaccination Clinic  Name:  KUTLER GUPTA    MRN: [id number] DOB: [birth year]  09/30/2019  Mr. Krygowski was observed post Covid-19 immunization for 15 minutes without incident. He was provided with Vaccine Information Sheet and instruction to access the V-Safe system.   Mr. Flippo was instructed to call 911 with any severe reactions post vaccine: [name] Difficulty breathing  . Swelling of face and throat  . A fast heartbeat  . A bad rash all over body  . Dizziness and weakness   Immunizations Administered    Name Date Dose VIS Date Route   Pfizer COVID-19 Vaccine 09/30/2019 10:04 AM 0.3 mL 06/02/2019 Intramuscular   Manufacturer: [place]   Lot: [id number]   [place]: [id number]

## 2019-10-25 ENCOUNTER — Ambulatory Visit: Payer: Self-pay | Attending: Internal Medicine

## 2019-10-25 DIAGNOSIS — Z23 Encounter for immunization: Secondary | ICD-10-CM

## 2019-10-25 NOTE — Progress Notes (Signed)
   Covid-19 Vaccination Clinic  Name:  Gabriel Hamilton    MRN: [id number] DOB: Sep 10, 1970  10/25/2019  [name] was observed post Covid-19 immunization for 15 minutes without incident. He was provided with Vaccine Information Sheet and instruction to access the V-Safe system.   [name] was instructed to call 911 with any severe reactions post vaccine: [name] Difficulty breathing  . Swelling of face and throat  . A fast heartbeat  . A bad rash all over body  . Dizziness and weakness   Immunizations Administered    Name Date Dose VIS Date Route   Pfizer COVID-19 Vaccine 10/25/2019  8:16 AM 0.3 mL 08/16/2018 Intramuscular   Manufacturer: [place]   Lot: [id number]   [place]: [id number]

## 2019-11-06 ENCOUNTER — Other Ambulatory Visit: Payer: Self-pay | Admitting: Podiatry

## 2019-11-07 MED ORDER — GABAPENTIN 100 MG PO CAPS: 100.0000 mg | ORAL_CAPSULE | Freq: Three times a day (TID) | ORAL | 0 refills | Status: DC

## 2019-11-25 ENCOUNTER — Other Ambulatory Visit: Payer: Self-pay | Admitting: Family Medicine

## 2019-11-25 ENCOUNTER — Other Ambulatory Visit: Payer: Self-pay | Admitting: Internal Medicine

## 2019-12-24 ENCOUNTER — Other Ambulatory Visit: Payer: Self-pay | Admitting: Internal Medicine

## 2019-12-24 ENCOUNTER — Other Ambulatory Visit: Payer: Self-pay | Admitting: Family Medicine

## 2019-12-28 ENCOUNTER — Other Ambulatory Visit: Payer: Self-pay | Admitting: Family Medicine

## 2020-01-03 ENCOUNTER — Other Ambulatory Visit: Payer: Self-pay | Admitting: Internal Medicine

## 2020-03-20 ENCOUNTER — Encounter: Payer: Self-pay | Admitting: Neurology

## 2020-04-01 ENCOUNTER — Ambulatory Visit (INDEPENDENT_AMBULATORY_CARE_PROVIDER_SITE_OTHER): Payer: No Typology Code available for payment source | Admitting: Neurology

## 2020-04-01 ENCOUNTER — Other Ambulatory Visit (INDEPENDENT_AMBULATORY_CARE_PROVIDER_SITE_OTHER): Payer: No Typology Code available for payment source

## 2020-04-01 ENCOUNTER — Encounter: Payer: Self-pay | Admitting: Neurology

## 2020-04-01 ENCOUNTER — Other Ambulatory Visit: Payer: Self-pay

## 2020-04-01 VITALS — BP 131/80 | HR 88 | Resp 18 | Ht 68.0 in | Wt 172.0 lb

## 2020-04-01 DIAGNOSIS — E1142 Type 2 diabetes mellitus with diabetic polyneuropathy: Secondary | ICD-10-CM

## 2020-04-01 DIAGNOSIS — G621 Alcoholic polyneuropathy: Secondary | ICD-10-CM

## 2020-04-01 LAB — B12 AND FOLATE PANEL
Folate: 5 ng/mL — ABNORMAL LOW (ref >5.9)
Vitamin B-12: 1282 pg/mL — ABNORMAL HIGH (ref 211–911)

## 2020-04-01 MED ORDER — NORTRIPTYLINE HCL 10 MG PO CAPS
ORAL_CAPSULE | ORAL | 3 refills | Status: DC
Start: 2020-04-01 — End: 2020-04-23

## 2020-04-01 NOTE — Progress Notes (Signed)
[facility] Clinic Note - Initial Visit   Date: 04/01/20  Gabriel Hamilton MRN: [id number] DOB: 1971-04-05   Dear Dr. Francesco Sor:  Thank you for your kind referral of Aengus Sauceda Kalka for consultation of neuropathy. Although his history is well known to you, please allow [place] to reiterate it for the purpose of our medical record. The patient was accompanied to the clinic by self.    History of Present Illness: Gabriel Hamilton is a 49 y.o. right-handed male with diabetes mellitus (HbA1c 6.2), GERD, hypertension, hyperlipidemia, tobacco, use presenting for evaluation of bilateral feet pain.   Starting around early 2020, he began having burning and numbness involving the feet and also involves his lower legs.  It is worse when he lays down and less apparent during the day. He has mild imbalance, no falls. No weakness.  He also complaints of similar symptoms in the fingers and hands, which is worse with certain position. He has previously tried gabapentin and pregabalin which did not provide any relief.  Over the pat 4-5 years, his diabetes has been under good control ~ HbA1c 6-7.  He has some imbalance.    He drinks 3 mixed drinks daily for the past 25-30 years.   He also has achy pain over left ankle.  He works as a Production manager.    Out-side paper records, electronic medical record, and images have been reviewed where available and summarized as:  Lab Results  Component Value Date   HGBA1C 6.1 (A) 03/22/2019   Lab Results  Component Value Date   [id number] 06/09/2017   Lab Results  Component Value Date   TSH 0.80 06/26/2015   No results found for: ESRSEDRATE, POCTSEDRATE  Past Medical History:  Diagnosis Date  . Acne   . Diabetes mellitus   . GERD (gastroesophageal reflux disease)   . Heart murmur   . Hypertension     History reviewed. No pertinent surgical history.   Medications:  Outpatient Encounter Medications as of 04/01/2020    Medication Sig  . Accu-Chek FastClix Lancets MISC USE TO CHECK BLOOD SUGAR TWICE DAILY  . ACCU-CHEK GUIDE test strip USE AS INSTRUCTED TO CHECK SUGAR TWICE DAILY.  [name] dicyclomine (BENTYL) 20 MG tablet Take 1 tablet (20 mg total) by mouth 2 (two) times daily.  [name] gentamicin cream (GARAMYCIN) 0.1 % Apply 1 application topically 2 (two) times daily.  [name] glimepiride (AMARYL) 2 MG tablet Take 1 tablet (2 mg total) by mouth daily with breakfast.  . INVOKANA 100 MG TABS tablet Take 100 mg by mouth daily.  . metFORMIN (GLUCOPHAGE-XR) 500 MG 24 hr tablet TAKE 4 TABLETS (2,000 MG TOTAL) BY MOUTH DAILY WITH BREAKFAST.  . naproxen sodium (ANAPROX) 220 MG tablet USING FOR JOINT PAIN  . omeprazole (PRILOSEC) 40 MG capsule TAKE 1 CAPSULE BY MOUTH EVERY DAY  . OVER THE COUNTER MEDICATION On an antibiotic for skin-long term  . ampicillin (PRINCIPEN) 500 MG capsule TAKE 1 CAPSULE BY MOUTH TWICE A DAY WITH FOOD  . atorvastatin (LIPITOR) 40 MG tablet Take 1 tablet (40 mg total) by mouth once a week.  . empagliflozin (JARDIANCE) 10 MG TABS tablet Take 10 mg by mouth daily before breakfast.  . fluorometholone (FML) 0.1 % ophthalmic suspension Place 1 drop into both eyes 2 times daily.  [name] gabapentin (NEURONTIN) 100 MG capsule Take 1 capsule (100 mg total) by mouth 3 (three) times daily.  [name] JANUVIA 100 MG tablet TAKE 1 TABLET BY MOUTH  EVERY DAY  . terbinafine (LAMISIL) 250 MG tablet Take 1 tablet (250 mg total) by mouth daily.   No facility-administered encounter medications on file as of 04/01/2020.    Allergies:  Allergies  Allergen Reactions  . Benazepril     May have caused angioedema    Family History: Family History  Problem Relation Age of Onset  . Diabetes Father   . Heart disease Father   . Hyperlipidemia Father   . Hypertension Father   . Arthritis Mother   . Cancer Mother        optic nerve    Social History: Social History   Tobacco Use  . Smoking status: Current Every Day Smoker     Packs/day: 0.50    Years: 22.00    Pack years: 11.00    Types: Cigarettes  . Smokeless tobacco: Never Used  Substance Use Topics  . Alcohol use: Yes    Alcohol/week: 14.0 standard drinks    Types: 14 Standard drinks or equivalent per week  . Drug use: No   Social History   Social History Narrative   Married for 8 years in 2015. Wife with 2 sons from previous marriage. 3 grandsons. 1 niece. Close family.       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  Speech is not dysarthric.  CRANIAL NERVES: II:  No visual field defects.  III-IV-VI: Pupils equal round and reactive to light.  Normal conjugate, extra-ocular eye movements in all directions of gaze.  No nystagmus.  No ptosis.   V:  Normal facial sensation.    VII:  Normal facial symmetry and movements.   VIII:  Normal hearing and vestibular function.   IX-X:  Normal palatal movement.   XI:  Normal shoulder shrug and head rotation.   XII:  Normal tongue strength and range of motion, no deviation or fasciculation.  MOTOR: Left 2nd and 4th digit with dry ulcer and dark discoloration.  Mild loss of muscle bulk in the lower legs.  No abnormal movements. No pronator drift.   Upper Extremity:  Right  Left  Deltoid  5/5   5/5   Biceps  5/5   5/5   Triceps  5/5   5/5   Infraspinatus 5/5  5/5  Medial pectoralis 5/5  5/5  Wrist extensors  5/5   5/5   Wrist flexors  5/5   5/5   Finger extensors  5/5   5/5   Finger flexors  5/5   5/5   Dorsal  interossei  5/5   5/5   Abductor pollicis  5/5   5/5   Tone (Ashworth scale)  0  0   Lower Extremity:  Right  Left  Hip flexors  5/5   5/5   Hip extensors  5/5   5/5   Adductor 5/5  5/5  Abductor 5/5  5/5  Knee flexors  5/5   5/5   Knee extensors  5/5   5/5   Dorsiflexors  5/5   5/5   Plantarflexors  5/5   5/5   Toe extensors  5/5   5/5   Toe flexors  5/5   5/5   Tone (Ashworth scale)  0  0   MSRs:  Right        Left                  brachioradialis 2+  2+  biceps 2+  2+  triceps 2+  2+  patellar 2+  2+  ankle jerk 0  0  Hoffman no  no  plantar response down  down   SENSORY:  Gradient pattern of vibration, temperature, and pin prick loss in the lower legs and ankles. Romberg's sign absent.   COORDINATION/GAIT: Normal finger-to- nose-finger.  Intact rapid alternating movements bilaterally. Gait narrow based and stable. Unsteady with tandem. Stressed gait intact.    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 Diabetes has been very well-controlled with HbA1c 6-7.  He does have about 25+ yr history of alcohol use which may be contributing to a greater degree.   - Check vitamin B12, vitamin B1, folate, copper, SPEP with IFE  - Start nortriptyline 10mg  at bedtime for 2 week, then increase to 2 tablet at bedtime  - Patient will call to schedule EMG, if he chooses to proceed  - Strongly encouraged to stop smoking and alcohol  - Patient educated on daily foot inspection, fall prevention, and safety precautions around the home.  Left ankle pain, likely musculoskeletal as the nature of achy pain is not characteristic for nerve pathology.  Left toe ulcer over the 2nd and 4th digit  - Scheduled to see podiatry this week  Return to clinic in 3 months.   Thank you for allowing me to participate in patient's care.  If I can answer any additional questions, I would be pleased to do so.    Sincerely,    Hermione Havlicek K. Posey Pronto, DO

## 2020-04-01 NOTE — Patient Instructions (Addendum)
Start nortriptyline 10mg  at bedtime for 2 week, then increase to 2 tablet at bedtime  Check labs   Please try to stop alcohol use and smoking  If you choose to have nerves testing, please contact my office  Return to clinic in 3 months  [place] (EMG/NCS) INSTRUCTIONS  How to Prepare The neurologist conducting the EMG will need to know if you have certain medical conditions. Tell the neurologist and other EMG lab personnel if you: . Have a pacemaker or any other electrical medical device . Take blood-thinning medications . Have hemophilia, a blood-clotting disorder that causes prolonged bleeding Bathing Take a shower or bath shortly before your exam in order to remove oils from your skin. Don't apply lotions or creams before the exam.  What to Expect You'll likely be asked to change into a hospital gown for the procedure and lie down on an examination table. The following explanations can help you understand what will happen during the exam.  . Electrodes. The neurologist or a technician places surface electrodes at various locations on your skin depending on where you're experiencing symptoms. Or the neurologist may insert needle electrodes at different sites depending on your symptoms.  . Sensations. The electrodes will at times transmit a tiny electrical current that you may feel as a twinge or spasm. The needle electrode may cause discomfort or pain that usually ends shortly after the needle is removed. If you are concerned about discomfort or pain, you may want to talk to the neurologist about taking a short break during the exam.  . Instructions. During the needle EMG, the neurologist will assess whether there is any spontaneous electrical activity when the muscle is at rest - activity that isn't present in healthy muscle tissue - and the degree of activity when you slightly contract the muscle.  He or she will give you instructions on resting and  contracting a muscle at appropriate times. Depending on what muscles and nerves the neurologist is examining, he or she may ask you to change positions during the exam.  After your EMG You may experience some temporary, minor bruising where the needle electrode was inserted into your muscle. This bruising should fade within several days. If it persists, contact your primary care doctor.     Your provider has requested that you have labwork completed today. Please go to [facility] ([id number]) on the second floor of this building before leaving the office today. You do not need to check in. If you are not called within 15 minutes please check with the front desk.

## 2020-04-03 ENCOUNTER — Other Ambulatory Visit: Payer: Self-pay

## 2020-04-03 ENCOUNTER — Ambulatory Visit (INDEPENDENT_AMBULATORY_CARE_PROVIDER_SITE_OTHER): Payer: No Typology Code available for payment source | Admitting: Podiatry

## 2020-04-03 DIAGNOSIS — B351 Tinea unguium: Secondary | ICD-10-CM

## 2020-04-03 DIAGNOSIS — E0843 Diabetes mellitus due to underlying condition with diabetic autonomic (poly)neuropathy: Secondary | ICD-10-CM | POA: Diagnosis not present

## 2020-04-03 DIAGNOSIS — M79676 Pain in unspecified toe(s): Secondary | ICD-10-CM | POA: Diagnosis not present

## 2020-04-03 DIAGNOSIS — M659 Synovitis and tenosynovitis, unspecified: Secondary | ICD-10-CM

## 2020-04-03 NOTE — Progress Notes (Signed)
   SUBJECTIVE Patient with a history of diabetes mellitus presents to office today complaining of elongated, thickened nails that cause pain while ambulating in shoes. He is unable to trim his own nails. He is concerned that when he cuts his nails may cause bleeding and irritation.  Patient also complains of some ankle stiffness and pain associated to the left ankle. He denies trauma or injury. He states that the stiffness and pain has been ongoing for several months now. He was last seen in the office approximately 1 year ago. Patient is here for further evaluation and treatment.   Past Medical History:  Diagnosis Date  . Acne   . Diabetes mellitus   . GERD (gastroesophageal reflux disease)   . Heart murmur   . Hypertension     OBJECTIVE General Patient is awake, alert, and oriented x 3 and in no acute distress. Derm Skin is dry and supple bilateral. Negative open lesions or macerations. Remaining integument unremarkable. Nails are tender, long, thickened and dystrophic with subungual debris, consistent with onychomycosis, 1-5 bilateral. No signs of infection noted. Vasc  DP and PT pedal pulses palpable bilaterally. Temperature gradient within normal limits.  Neuro Epicritic and protective threshold sensation diminished bilaterally.  Musculoskeletal Exam No symptomatic pedal deformities noted bilateral. Muscular strength within normal limits. There is some pain on palpation to the medial lateral and anterior aspects of the left ankle joint.  ASSESSMENT 1. Diabetes Mellitus w/ peripheral neuropathy 2. Pain due to onychomycosis of toenails bilateral 3. Synovitis of left ankle  PLAN OF CARE 1. Patient evaluated today. 2. Instructed to maintain good pedal hygiene and foot care. Stressed importance of controlling blood sugar.  3. Mechanical debridement of nails 1-5 bilaterally performed using a nail nipper. Filed with dremel without incident.  4. Injection of 0.5 cc Celestone Soluspan  injection for the left ankle joint  5. Compression ankle sleeve dispensed bilateral  6. Appointment with Pedorthist for custom molded diabetic shoes and insoles  7. Return to clinic annually    Edrick Kins, DPM [facility]  Dr. Edrick Kins, [address]                                        [place], [place] [address]                Office [phone]  Fax [phone]

## 2020-04-04 LAB — IMMUNOFIXATION ELECTROPHORESIS
IgG (Immunoglobin G), Serum: 1063 mg/dL (ref 600–1640)
IgM, Serum: 86 mg/dL (ref 50–300)
Immunofix Electr Int: NOT DETECTED
Immunoglobulin A: 194 mg/dL (ref 47–310)

## 2020-04-04 LAB — COPPER, SERUM: Copper: 163 ug/dL (ref 70–175)

## 2020-04-04 LAB — PROTEIN ELECTROPHORESIS, SERUM
Albumin ELP: 3.8 g/dL (ref 3.8–4.8)
Alpha 1: 0.4 g/dL — ABNORMAL HIGH (ref 0.2–0.3)
Alpha 2: 0.7 g/dL (ref 0.5–0.9)
Beta 2: 0.4 g/dL (ref 0.2–0.5)
Beta Globulin: 0.5 g/dL (ref 0.4–0.6)
Gamma Globulin: 1.1 g/dL (ref 0.8–1.7)
Total Protein: 6.8 g/dL (ref 6.1–8.1)

## 2020-04-23 ENCOUNTER — Other Ambulatory Visit: Payer: Self-pay | Admitting: Neurology

## 2020-05-05 ENCOUNTER — Emergency Department (HOSPITAL_COMMUNITY)
Admission: EM | Admit: 2020-05-05 | Discharge: 2020-05-05 | Disposition: A | Discharge status: Home / Self Care | Payer: No Typology Code available for payment source | Attending: Emergency Medicine | Admitting: Emergency Medicine

## 2020-05-05 ENCOUNTER — Emergency Department (HOSPITAL_COMMUNITY): Payer: No Typology Code available for payment source

## 2020-05-05 ENCOUNTER — Encounter (HOSPITAL_COMMUNITY): Payer: Self-pay | Admitting: Obstetrics and Gynecology

## 2020-05-05 ENCOUNTER — Other Ambulatory Visit: Payer: Self-pay

## 2020-05-05 DIAGNOSIS — I10 Essential (primary) hypertension: Secondary | ICD-10-CM | POA: Insufficient documentation

## 2020-05-05 DIAGNOSIS — R109 Unspecified abdominal pain: Secondary | ICD-10-CM

## 2020-05-05 DIAGNOSIS — F17210 Nicotine dependence, cigarettes, uncomplicated: Secondary | ICD-10-CM | POA: Diagnosis not present

## 2020-05-05 DIAGNOSIS — Z7984 Long term (current) use of oral hypoglycemic drugs: Secondary | ICD-10-CM | POA: Diagnosis not present

## 2020-05-05 DIAGNOSIS — R1033 Periumbilical pain: Secondary | ICD-10-CM | POA: Diagnosis not present

## 2020-05-05 DIAGNOSIS — K219 Gastro-esophageal reflux disease without esophagitis: Secondary | ICD-10-CM | POA: Insufficient documentation

## 2020-05-05 DIAGNOSIS — E1165 Type 2 diabetes mellitus with hyperglycemia: Secondary | ICD-10-CM | POA: Diagnosis not present

## 2020-05-05 LAB — COMPREHENSIVE METABOLIC PANEL
ALT: 19 U/L (ref 0–44)
AST: 21 U/L (ref 15–41)
Albumin: 3.8 g/dL (ref 3.5–5.0)
Alkaline Phosphatase: 78 U/L (ref 38–126)
Anion gap: 13 (ref 5–15)
BUN: 6 mg/dL (ref 6–20)
CO2: 25 mmol/L (ref 22–32)
Calcium: 9.4 mg/dL (ref 8.9–10.3)
Chloride: 98 mmol/L (ref 98–111)
Creatinine, Ser: 0.85 mg/dL (ref 0.61–1.24)
GFR, Estimated: >60 mL/min (ref >60)
Glucose, Bld: 212 mg/dL — ABNORMAL HIGH (ref 70–99)
Potassium: 4.5 mmol/L (ref 3.5–5.1)
Sodium: 136 mmol/L (ref 135–145)
Total Bilirubin: 0.8 mg/dL (ref 0.3–1.2)
Total Protein: 7.8 g/dL (ref 6.5–8.1)

## 2020-05-05 LAB — URINALYSIS, ROUTINE W REFLEX MICROSCOPIC
Bacteria, UA: NONE SEEN
Bilirubin Urine: NEGATIVE
Glucose, UA: >=500 mg/dL — AB
Hgb urine dipstick: NEGATIVE
Ketones, ur: 5 mg/dL — AB
Leukocytes,Ua: NEGATIVE
Nitrite: NEGATIVE
Protein, ur: NEGATIVE mg/dL
Specific Gravity, Urine: 1.025 (ref 1.005–1.030)
pH: 5 (ref 5.0–8.0)

## 2020-05-05 LAB — CBC WITH DIFFERENTIAL/PLATELET
Abs Immature Granulocytes: 0.03 10*3/uL (ref 0.00–0.07)
Basophils Absolute: 0 10*3/uL (ref 0.0–0.1)
Basophils Relative: 0 %
Eosinophils Absolute: 0.2 10*3/uL (ref 0.0–0.5)
Eosinophils Relative: 2 %
HCT: 60.9 % — ABNORMAL HIGH (ref 39.0–52.0)
Hemoglobin: 19.6 g/dL — ABNORMAL HIGH (ref 13.0–17.0)
Immature Granulocytes: 0 %
Lymphocytes Relative: 22 %
Lymphs Abs: 2 10*3/uL (ref 0.7–4.0)
MCH: 31.9 pg (ref 26.0–34.0)
MCHC: 32.2 g/dL (ref 30.0–36.0)
MCV: 99.2 fL (ref 80.0–100.0)
Monocytes Absolute: 0.5 10*3/uL (ref 0.1–1.0)
Monocytes Relative: 6 %
Neutro Abs: 6.1 10*3/uL (ref 1.7–7.7)
Neutrophils Relative %: 70 %
Platelets: 258 10*3/uL (ref 150–400)
RBC: 6.14 MIL/uL — ABNORMAL HIGH (ref 4.22–5.81)
RDW: 14.4 % (ref 11.5–15.5)
WBC: 8.8 10*3/uL (ref 4.0–10.5)
nRBC: 0 % (ref 0.0–0.2)

## 2020-05-05 LAB — TROPONIN I (HIGH SENSITIVITY)
Troponin I (High Sensitivity): <2 ng/L (ref <18)
Troponin I (High Sensitivity): <2 ng/L (ref <18)

## 2020-05-05 LAB — LIPASE, BLOOD: Lipase: 38 U/L (ref 11–51)

## 2020-05-05 MED ORDER — IOHEXOL 300 MG/ML  SOLN
100.0000 mL | Freq: Once | INTRAMUSCULAR | Status: AC | PRN
  Administered 2020-05-05: 100 mL via INTRAVENOUS

## 2020-05-05 MED ORDER — SODIUM CHLORIDE 0.9 % IV BOLUS
1000.0000 mL | Freq: Once | INTRAVENOUS | Status: AC
  Administered 2020-05-05: 1000 mL via INTRAVENOUS

## 2020-05-05 MED ORDER — PANTOPRAZOLE SODIUM 40 MG IV SOLR
40.0000 mg | Freq: Once | INTRAVENOUS | Status: AC
  Administered 2020-05-05: 40 mg via INTRAVENOUS
  Filled 2020-05-05: qty 40

## 2020-05-05 MED ORDER — MORPHINE SULFATE (PF) 4 MG/ML IV SOLN
4.0000 mg | Freq: Once | INTRAVENOUS | Status: AC
  Administered 2020-05-05: 4 mg via INTRAVENOUS
  Filled 2020-05-05: qty 1

## 2020-05-05 MED ORDER — FAMOTIDINE 20 MG PO TABS: 20.0000 mg | ORAL_TABLET | Freq: Two times a day (BID) | ORAL | 0 refills | Status: DC

## 2020-05-05 NOTE — ED Triage Notes (Signed)
Patient reports in the past 60 days he has expierienced a significant weight loss and loss of appetite. Patient reports he woke up this morning and had a BM and then had a horrible episode of abdominal pain to which he could not walk. Patient called 911 and was diaphoretic and tachycardic on scene. Patient reports he has been in touch with his PCP and GI over the last 2 months.

## 2020-05-05 NOTE — Discharge Instructions (Addendum)
At this time there does not appear to be the presence of an emergent medical condition, however there is always the potential for conditions to change. Please read and follow the below instructions.  Please return to the Emergency Department immediately for any new or worsening symptoms. Please be sure to follow up with your Primary Care Provider within one week regarding your visit today; please call their office to schedule an appointment even if you are feeling better for a follow-up visit. Please follow-up with your gastroenterologist for a recheck.  Please take your omeprazole as prescribed.  You may take the medication Pepcid to help with possible heartburn related symptoms. As we discussed your CT scan today showed fat deposits in the wall of the colon which may be secondary to inflammatory bowel disease, this will need follow-up by your gastroenterologist.  It also showed pancreas divisum which can be addressed by your gastroenterologist.  Finally it showed some mild retraction along your right testicle or spermatic cord which can be followed up by your primary care provider.  Go to the nearest Emergency Department immediately if: You have fever or chills Your pain does not go away as soon as your doctor says it should. You cannot stop vomiting. Your pain is only in areas of your belly, such as the right side or the left lower part of the belly. You have bloody or black poop, or poop that looks like tar. You have very bad pain, cramping, or bloating in your belly. You have signs of not having enough fluid or water in your body (dehydration), such as: Dark pee, very little pee, or no pee. Cracked lips. Dry mouth. Sunken eyes. Sleepiness. Weakness. You have trouble breathing or chest pain. You have any new/concerning or worsening of symptoms   Please read the additional information packets attached to your discharge summary.  Do not take your medicine if  develop an itchy rash,  swelling in your mouth or lips, or difficulty breathing; call 911 and seek immediate emergency medical attention if this occurs.  You may review your lab tests and imaging results in their entirety on your MyChart account.  Please discuss all results of fully with your primary care provider and other specialist at your follow-up visit.  Note: Portions of this text may have been transcribed using voice recognition software. Every effort was made to ensure accuracy; however, inadvertent computerized transcription errors may still be present.

## 2020-05-05 NOTE — ED Provider Notes (Signed)
[facility] DEPT Provider Note   CSN: [id number] Arrival date & time: 05/05/20  1029     History Chief Complaint  Patient presents with  . Abdominal Pain    Gabriel Hamilton is a 49 y.o. male history of diabetes, GERD, hypertension, hyperlipidemia, acne on chronic doxycycline.  Patient reports the past 2 months he has been experiencing unintentional weight loss she is not sure how much weight he is she reports early satiety as well.  He is being worked up by his gastroenterologist at [facility] for this and reports he has a CT scan planned for a few weeks for evaluation.  He reports that he was feeling his normal state of health until this morning shortly after he had a bowel movement he had an episode of mid abdominal pain which was a crampy pain constant nonradiating no clear aggravating or alleviating factor has gradually improved since onset.  This was associated with some diaphoresis.  He reports he has never experienced these symptoms before.  Denies fever/chills, headache, chest pain/shortness of breath, cough, hemoptysis, nausea/vomiting, hematochezia, melena, testicular pain/swelling, dysuria/hematuria, extremity swelling/color change or any additional concerns.  HPI     Past Medical History:  Diagnosis Date  . Acne   . Diabetes mellitus   . GERD (gastroesophageal reflux disease)   . Heart murmur   . Hypertension     Patient Active Problem List   Diagnosis Date Noted  . Overweight 11/16/2018  . 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 Type 2 diabetes mellitus with hyperglycemia, without long-term current use of insulin ([place]) 12/18/2015  . Ingrown nail 07/24/2015  . 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 Equinus deformity of foot, acquired 05/08/2015  . Onychomycosis 12/26/2014  . Hyperlipidemia, unspecified 06/06/2014  . Tachycardia 02/14/2014  . Acne   . Tobacco abuse 12/22/2011    . Polycythemia 08/26/2011  . HYPOGONADISM 11/21/2009  . Essential hypertension 07/10/2009  . GERD 03/27/2008  . HEART MURMUR 03/27/2008    History reviewed. No pertinent surgical history.     Family History  Problem Relation Age of Onset  . Diabetes Father   . Heart disease Father   . Hyperlipidemia Father   . Hypertension Father   . Arthritis Mother   . Cancer Mother        optic nerve    Social History   Tobacco Use  . Smoking status: Current Every Day Smoker    Packs/day: 0.50    Years: 22.00    Pack years: 11.00    Types: Cigarettes  . Smokeless tobacco: Never Used  Substance Use Topics  . Alcohol use: Yes    Alcohol/week: 14.0 standard drinks    Types: 14 Standard drinks or equivalent per week  . Drug use: No    Home Medications Prior to Admission medications   Medication Sig Start Date End Date Taking? Authorizing Provider  cyanocobalamin 1000 MCG tablet Take 1,000 mcg by mouth daily.    Yes [provider]  doxycycline (VIBRAMYCIN) 100 MG capsule Take 100 mg by mouth daily. 03/06/20  Yes [provider]  gentamicin cream (GARAMYCIN) 0.1 % Apply 1 application topically 2 (two) times daily. 09/14/18  Yes Edrick Kins, DPM  glimepiride (AMARYL) 2 MG tablet Take 1 tablet (2 mg total) by mouth daily with breakfast. 07/03/19  Yes Philemon Kingdom, MD  INVOKANA 100 MG TABS tablet Take 100 mg by mouth daily. 03/13/20  Yes [provider]  metFORMIN (GLUCOPHAGE-XR) 500 MG 24 hr tablet TAKE 4 TABLETS (2,000 MG TOTAL) BY MOUTH DAILY WITH BREAKFAST. Patient taking differently: Take 1,000 mg by mouth in the morning and at bedtime.  12/20/18  Yes Philemon Kingdom, MD  nortriptyline (PAMELOR) 10 MG capsule Take 2 capsules (20 mg total) by mouth at bedtime. START NORTRIPTYLINE 10MG  AT BEDTIME FOR 2 WEEK, THEN INCREASE TO 2 CAPSULES AT BEDTIME Patient taking differently: Take 10 mg by mouth at bedtime.  04/23/20  Yes Patel, Donika K, DO  omeprazole  (PRILOSEC) 40 MG capsule TAKE 1 CAPSULE BY MOUTH EVERY DAY Patient taking differently: Take 40 mg by mouth daily.  12/29/19  Yes Marin Olp, MD  silver sulfADIAZINE (SILVADENE) 1 % cream Apply 1 application topically 2 (two) times daily.  04/24/20  Yes [provider]  Accu-Chek FastClix Lancets MISC USE TO CHECK BLOOD SUGAR TWICE DAILY 11/16/18   Philemon Kingdom, MD  ACCU-CHEK GUIDE test strip USE AS INSTRUCTED TO CHECK SUGAR TWICE DAILY. 01/30/19   Philemon Kingdom, MD  ampicillin (PRINCIPEN) 500 MG capsule TAKE 1 CAPSULE BY MOUTH TWICE A DAY WITH FOOD Patient not taking: Reported on 05/05/2020 01/31/19   [provider]  atorvastatin (LIPITOR) 40 MG tablet Take 1 tablet (40 mg total) by mouth once a week. Patient not taking: Reported on 05/05/2020 12/14/18   Marin Olp, MD  dicyclomine (BENTYL) 20 MG tablet Take 1 tablet (20 mg total) by mouth 2 (two) times daily. Patient not taking: Reported on 05/05/2020 10/29/16   Barnet Glasgow, NP  empagliflozin (JARDIANCE) 10 MG TABS tablet Take 10 mg by mouth daily before breakfast. Patient not taking: Reported on 05/05/2020 09/11/19   Philemon Kingdom, MD  famotidine (PEPCID) 20 MG tablet Take 1 tablet (20 mg total) by mouth 2 (two) times daily. 05/05/20   Nuala Alpha A, PA-C  fluorometholone (FML) 0.1 % ophthalmic suspension Place 1 drop into both eyes 2 times daily. Patient not taking: Reported on 05/05/2020    [provider]  gabapentin (NEURONTIN) 100 MG capsule Take 1 capsule (100 mg total) by mouth 3 (three) times daily. Patient not taking: Reported on 05/05/2020 11/07/19   Edrick Kins, DPM  JANUVIA 100 MG tablet TAKE 1 TABLET BY MOUTH EVERY DAY Patient not taking: Reported on 05/05/2020 07/25/19   Philemon Kingdom, MD  terbinafine (LAMISIL) 250 MG tablet Take 1 tablet (250 mg total) by mouth daily. Patient not taking: Reported on 05/05/2020 09/14/18   Edrick Kins, DPM    Allergies      Benazepril  Review of Systems   Review of Systems Ten systems are reviewed and are negative for acute change except as noted in the HPI  Physical Exam Updated Vital Signs BP 133/90   Pulse 76   Temp (!) 97.5 F (36.4 C) (Oral)   Resp 16   Ht 5\' 8"  (1.727 m)   Wt 74.8 kg   SpO2 99%   BMI 25.09 kg/m   Physical Exam Constitutional:      General: He is not in acute distress.    Appearance: Normal appearance. He is well-developed. He is not ill-appearing or diaphoretic.  HENT:     Head: Normocephalic and atraumatic.  Eyes:     General: Vision grossly intact. Gaze aligned appropriately.     Pupils: Pupils are equal, round, and reactive to light.  Neck:     Trachea: Trachea and phonation normal.  Cardiovascular:     Rate and Rhythm: Normal rate  and regular rhythm.     Pulses:          Dorsalis pedis pulses are 2+ on the right side and 2+ on the left side.  Pulmonary:     Effort: Pulmonary effort is normal. No respiratory distress.  Abdominal:     General: There is no distension.     Palpations: Abdomen is soft.     Tenderness: There is abdominal tenderness in the periumbilical area. There is no guarding or rebound. Negative signs include Murphy's sign and McBurney's sign.  Genitourinary:    Comments: Deferred Musculoskeletal:        General: Normal range of motion.     Cervical back: Normal range of motion.     Right lower leg: No edema.     Left lower leg: No edema.  Feet:     Right foot:     Protective Sensation: 3 sites tested. 3 sites sensed.     Left foot:     Protective Sensation: 3 sites tested. 3 sites sensed.  Skin:    General: Skin is warm and dry.  Neurological:     Mental Status: He is alert.     GCS: GCS eye subscore is 4. GCS verbal subscore is 5. GCS motor subscore is 6.     Comments: Speech is clear and goal oriented, follows commands Major Cranial nerves without deficit, no facial droop Moves extremities without ataxia, coordination intact   Psychiatric:        Behavior: Behavior normal.     ED Results / Procedures / Treatments   Labs (all labs ordered are listed, but only abnormal results are displayed) Labs Reviewed  COMPREHENSIVE METABOLIC PANEL - Abnormal; Notable for the following components:      Result Value   Glucose, Bld 212 (*)    All other components within normal limits  URINALYSIS, ROUTINE W REFLEX MICROSCOPIC - Abnormal; Notable for the following components:   Glucose, UA >=500 (*)    Ketones, ur 5 (*)    All other components within normal limits  CBC WITH DIFFERENTIAL/PLATELET - Abnormal; Notable for the following components:   RBC 6.14 (*)    Hemoglobin 19.6 (*)    HCT 60.9 (*)    All other components within normal limits  LIPASE, BLOOD  TROPONIN I (HIGH SENSITIVITY)  TROPONIN I (HIGH SENSITIVITY)    EKG EKG Interpretation  Date/Time:  Sunday May 05 2020 11:59:52 EST Ventricular Rate:  79 PR Interval:    QRS Duration: 96 QT Interval:  383 QTC Calculation: 439 R Axis:   69 Text Interpretation: Sinus rhythm No significant change since last tracing Confirmed by Lacretia Leigh ([id number]) on 05/05/2020 2:26:15 PM   Radiology CT ABDOMEN PELVIS W CONTRAST  Result Date: 05/05/2020 CLINICAL DATA:  Unintentional weight loss. Epigastric pain and vomiting. EXAM: CT ABDOMEN AND PELVIS WITH CONTRAST TECHNIQUE: Multidetector CT imaging of the abdomen and pelvis was performed using the standard protocol following bolus administration of intravenous contrast. CONTRAST:  138mL OMNIPAQUE IOHEXOL 300 MG/ML  SOLN COMPARISON:  Report from 12/16/2000 FINDINGS: Lower chest: Unremarkable Hepatobiliary: Unremarkable Pancreas: Pancreas divisum. Spleen: Unremarkable Adrenals/Urinary Tract: Unremarkable Stomach/Bowel: Fat deposition in the wall the colon, this can be an incidental finding but also has a weak association with inflammatory bowel disease. Normal appendix. Normal terminal ileum. Vascular/Lymphatic:  Unremarkable Reproductive: Mildly retracted right testicle along the spermatic cord. Otherwise unremarkable. Other: No supplemental non-categorized findings. Musculoskeletal: Unremarkable IMPRESSION: 1. A specific cause for the patient's weight loss  is not identified. 2. Pancreas divisum. 3. Fat deposition in the wall the colon, this can be an incidental finding but also has a weak association with inflammatory bowel disease. 4. Mildly retracted right testicle along the spermatic cord. Electronically Signed   By: Van Clines M.D.   On: 05/05/2020 13:21   DG Chest Port 1 View  Result Date: 05/05/2020 CLINICAL DATA:  Epigastric pain beginning this morning. Weight loss over past 2 months. EXAM: PORTABLE CHEST 1 VIEW COMPARISON:  None. FINDINGS: The heart size and mediastinal contours are within normal limits. Both lungs are clear. The visualized skeletal structures are unremarkable. IMPRESSION: No active disease. Electronically Signed   By: Marlaine Hind M.D.   On: 05/05/2020 12:30    Procedures Procedures (including critical care time)  Medications Ordered in ED Medications  sodium chloride 0.9 % bolus 1,000 mL (0 mLs Intravenous Stopped 05/05/20 1321)  morphine 4 MG/ML injection 4 mg (4 mg Intravenous Given 05/05/20 1211)  pantoprazole (PROTONIX) injection 40 mg (40 mg Intravenous Given 05/05/20 1211)  iohexol (OMNIPAQUE) 300 MG/ML solution 100 mL (100 mLs Intravenous Contrast Given 05/05/20 1229)    ED Course  I have reviewed the triage vital signs and the nursing notes.  Pertinent labs & imaging results that were available during my care of the patient were reviewed by me and considered in my medical decision making (see chart for details).    MDM Rules/Calculators/A&P                         Additional history obtained from: 1. Nursing notes from this visit. 2. Review of electronic medical records.  No pertinent recent ER visits. ----------------------- 49 year old male  presented for abdominal pain and diaphoresis that started after a bowel movement this morning.  He has also been experiencing acidity and weight loss for last 2 months.  Symptoms are resolving currently pain is only mild.  No associated chest pain or shortness of breath.  No recent infectious symptoms.  Patient is generally tender to the abdomen but no peritoneal signs.  He is resting comfortably and in no acute distress.  Will obtain abdominal pain the lab work as well as CT abdomen pelvis for evaluation of his abdominal pain.  Considering he is a diabetic and had some diaphoresis earlier along with his abdominal pain will check cardiac enzymes chest x-ray and EKG.  However low suspicion for ACS, dissection, PE or other emergent cardiopulmonary pathologies. -  I ordered, reviewed and interpreted labs which include: Initial and delta high-sensitivity troponins negative.  Doubt ACS. CBC shows some elevated hemoglobin level of 19.6, no leukocytosis to suggest bacterial infection. CMP shows no emergent electrolyte derangement, AKI, LFT elevations or decreased bicarb. Lipase within normal limits, doubt pancreatitis. Urinalysis shows glucose and 5 ketones, no evidence of infection.  With reassuring CMP doubt DKA or hyperglycemic emergency.  Patient given IV fluids.  Suspect ketones secondary to dehydration.  EKG: Sinus rhythm No significant change since last tracing Confirmed by Lacretia Leigh ([id number]) on 05/05/2020 2:26:15 PM  CXR:    IMPRESSION:  No active disease.   CT AP:  IMPRESSION:  1. A specific cause for the patient's weight loss is not identified.  2. Pancreas divisum.  3. Fat deposition in the wall the colon, this can be an incidental  finding but also has a weak association with inflammatory bowel  disease.  4. Mildly retracted right testicle along the spermatic cord.   CT scan  reviewed with patient, he states understanding.  He has no testicular pain or swelling no concern for  torsion, epididymitis or other emergent pathologies at this time.  He was aware of the possible inflammatory bowel disease findings, he is encouraged to see his gastroenterologist this week for recheck. - Possible that patient had a vagal episode while on the toilet today, symptoms improving.  Will have patient continue PPI and start on Pepcid, low suspicion for ulcer/perforation at this time.  Encourage patient to see his gastroenterologist this week for recheck.  Patient and his wife at bedside are agreeable to plan.  At this time there does not appear to be any evidence of an acute emergency medical condition and the patient appears stable for discharge with appropriate outpatient follow up. Diagnosis was discussed with patient who verbalizes understanding of care plan and is agreeable to discharge. I have discussed return precautions with patient and wife who verbalizes understanding. Patient encouraged to follow-up with their PCP and GI. All questions answered.  Patient's case discussed with Dr. Zenia Resides who agrees with plan to discharge with outpatient GI follow-up.   Note: Portions of this report may have been transcribed using voice recognition software. Every effort was made to ensure accuracy; however, inadvertent computerized transcription errors may still be present. Final Clinical Impression(s) / ED Diagnoses Final diagnoses:  Abdominal pain, unspecified abdominal location    Rx / DC Orders ED Discharge Orders         Ordered    famotidine (PEPCID) 20 MG tablet  2 times daily        05/05/20 [address] 05/05/20 1511    Lacretia Leigh, MD 05/10/20 1351

## 2020-05-05 NOTE — ED Notes (Signed)
PT IS IN THE RESTROOM,

## 2020-07-04 ENCOUNTER — Ambulatory Visit: Payer: No Typology Code available for payment source | Admitting: Neurology

## 2020-07-24 ENCOUNTER — Ambulatory Visit (INDEPENDENT_AMBULATORY_CARE_PROVIDER_SITE_OTHER): Payer: 59 | Admitting: Podiatry

## 2020-07-24 ENCOUNTER — Other Ambulatory Visit: Payer: Self-pay

## 2020-07-24 DIAGNOSIS — M79676 Pain in unspecified toe(s): Secondary | ICD-10-CM

## 2020-07-24 DIAGNOSIS — E0843 Diabetes mellitus due to underlying condition with diabetic autonomic (poly)neuropathy: Secondary | ICD-10-CM

## 2020-07-24 DIAGNOSIS — R600 Localized edema: Secondary | ICD-10-CM

## 2020-07-24 DIAGNOSIS — B351 Tinea unguium: Secondary | ICD-10-CM

## 2020-07-24 DIAGNOSIS — L989 Disorder of the skin and subcutaneous tissue, unspecified: Secondary | ICD-10-CM

## 2020-07-24 NOTE — Progress Notes (Signed)
   SUBJECTIVE Patient with a history of diabetes mellitus presents to office today complaining of elongated, thickened nails that cause pain while ambulating in shoes. He is unable to trim his own nails. He is concerned that when he cuts his nails may cause bleeding and irritation.  Patient states that the injections he received to his left ankle only helped for approximately 1-2 days.  He continues to wear the compression sleeve daily.  He has not made an appointment with our Pedorthist for new diabetic shoes and insoles   Past Medical History:  Diagnosis Date  . Acne   . Diabetes mellitus   . GERD (gastroesophageal reflux disease)   . Heart murmur   . Hypertension     OBJECTIVE General Patient is awake, alert, and oriented x 3 and in no acute distress. Derm Skin is dry and supple bilateral. Negative open lesions or macerations. Remaining integument unremarkable. Nails are tender, long, thickened and dystrophic with subungual debris, consistent with onychomycosis, 1-5 bilateral. No signs of infection noted. Vasc  DP and PT pedal pulses palpable bilaterally. Temperature gradient within normal limits.  Neuro Epicritic and protective threshold sensation diminished bilaterally.  Musculoskeletal Exam No symptomatic pedal deformities noted bilateral. Muscular strength within normal limits. There is some pain on palpation to the medial lateral and anterior aspects of the left ankle joint.  ASSESSMENT 1. Diabetes Mellitus w/ peripheral neuropathy 2. Pain due to onychomycosis of toenails bilateral 3. Synovitis of left ankle 4.  Preulcerative callus bilateral feet  PLAN OF CARE 1. Patient evaluated today. 2. Instructed to maintain good pedal hygiene and foot care. Stressed importance of controlling blood sugar.  3. Mechanical debridement of nails 1-5 bilaterally performed using a nail nipper. Filed with dremel without incident.  4.  Excisional debridement of the hyperkeratotic preulcerative  callus tissue was performed using a tissue nipper without incident or bleeding 5. Compression ankle sleeve dispensed bilateral.  Continue wearing daily 6. Appointment with Pedorthist for custom molded diabetic shoes and insoles  7. Return to clinic as needed    Edrick Kins, DPM [facility]  Dr. Edrick Kins, [address]                                        [place], [place] [address]                Office [phone]  Fax [phone]

## 2020-08-15 ENCOUNTER — Emergency Department (HOSPITAL_COMMUNITY)
Admission: EM | Admit: 2020-08-15 | Discharge: 2020-08-15 | Disposition: A | Discharge status: Home / Self Care | Payer: 59 | Attending: Emergency Medicine | Admitting: Emergency Medicine

## 2020-08-15 ENCOUNTER — Encounter (HOSPITAL_COMMUNITY): Payer: Self-pay

## 2020-08-15 ENCOUNTER — Other Ambulatory Visit: Payer: Self-pay

## 2020-08-15 DIAGNOSIS — Z7984 Long term (current) use of oral hypoglycemic drugs: Secondary | ICD-10-CM | POA: Insufficient documentation

## 2020-08-15 DIAGNOSIS — K219 Gastro-esophageal reflux disease without esophagitis: Secondary | ICD-10-CM | POA: Insufficient documentation

## 2020-08-15 DIAGNOSIS — Z79899 Other long term (current) drug therapy: Secondary | ICD-10-CM | POA: Diagnosis not present

## 2020-08-15 DIAGNOSIS — R109 Unspecified abdominal pain: Secondary | ICD-10-CM | POA: Diagnosis present

## 2020-08-15 DIAGNOSIS — F17210 Nicotine dependence, cigarettes, uncomplicated: Secondary | ICD-10-CM | POA: Diagnosis not present

## 2020-08-15 DIAGNOSIS — I10 Essential (primary) hypertension: Secondary | ICD-10-CM | POA: Insufficient documentation

## 2020-08-15 DIAGNOSIS — E119 Type 2 diabetes mellitus without complications: Secondary | ICD-10-CM | POA: Diagnosis not present

## 2020-08-15 LAB — COMPREHENSIVE METABOLIC PANEL
ALT: 21 U/L (ref 0–44)
AST: 29 U/L (ref 15–41)
Albumin: 4 g/dL (ref 3.5–5.0)
Alkaline Phosphatase: 89 U/L (ref 38–126)
Anion gap: 15 (ref 5–15)
BUN: 6 mg/dL (ref 6–20)
CO2: 27 mmol/L (ref 22–32)
Calcium: 9.2 mg/dL (ref 8.9–10.3)
Chloride: 96 mmol/L — ABNORMAL LOW (ref 98–111)
Creatinine, Ser: 0.81 mg/dL (ref 0.61–1.24)
GFR, Estimated: >60 mL/min (ref >60)
Glucose, Bld: 165 mg/dL — ABNORMAL HIGH (ref 70–99)
Potassium: 4.4 mmol/L (ref 3.5–5.1)
Sodium: 138 mmol/L (ref 135–145)
Total Bilirubin: 0.6 mg/dL (ref 0.3–1.2)
Total Protein: 7.8 g/dL (ref 6.5–8.1)

## 2020-08-15 LAB — CBC
HCT: 56.2 % — ABNORMAL HIGH (ref 39.0–52.0)
Hemoglobin: 18.5 g/dL — ABNORMAL HIGH (ref 13.0–17.0)
MCH: 31.4 pg (ref 26.0–34.0)
MCHC: 32.9 g/dL (ref 30.0–36.0)
MCV: 95.4 fL (ref 80.0–100.0)
Platelets: 223 10*3/uL (ref 150–400)
RBC: 5.89 MIL/uL — ABNORMAL HIGH (ref 4.22–5.81)
RDW: 17.7 % — ABNORMAL HIGH (ref 11.5–15.5)
WBC: 6.5 10*3/uL (ref 4.0–10.5)
nRBC: 0 % (ref 0.0–0.2)

## 2020-08-15 LAB — LIPASE, BLOOD: Lipase: 43 U/L (ref 11–51)

## 2020-08-15 MED ORDER — ONDANSETRON HCL 4 MG/2ML IJ SOLN
4.0000 mg | Freq: Once | INTRAMUSCULAR | Status: AC
  Administered 2020-08-15: 4 mg via INTRAVENOUS
  Filled 2020-08-15: qty 2

## 2020-08-15 MED ORDER — FAMOTIDINE IN NACL 20-0.9 MG/50ML-% IV SOLN
20.0000 mg | Freq: Once | INTRAVENOUS | Status: AC
  Administered 2020-08-15: 20 mg via INTRAVENOUS
  Filled 2020-08-15: qty 50

## 2020-08-15 MED ORDER — ALUM & MAG HYDROXIDE-SIMETH 200-200-20 MG/5ML PO SUSP
30.0000 mL | Freq: Once | ORAL | Status: AC
  Administered 2020-08-15: 30 mL via ORAL
  Filled 2020-08-15: qty 30

## 2020-08-15 MED ORDER — SUCRALFATE 1 GM/10ML PO SUSP: 1.0000 g | Freq: Three times a day (TID) | ORAL | 0 refills | Status: DC

## 2020-08-15 NOTE — ED Notes (Signed)
Pt given graham crackers and ginger ale for PO challenge °

## 2020-08-15 NOTE — Discharge Instructions (Signed)
Continue taking your omeprazole.  Take the Carafate as directed. Follow-up with your GI doctor and primary care provider. Return to the ER if you start to experience worsening abdominal pain, if you develop chest pain, shortness of breath, uncontrollable vomiting or bloody stools.

## 2020-08-15 NOTE — ED Triage Notes (Signed)
patient c/o mid abdominal pain. N/V/D since 0700 today.

## 2020-08-15 NOTE — ED Provider Notes (Signed)
[facility] DEPT Provider Note   CSN: [id number] Arrival date & time: 08/15/20  3235     History Chief Complaint  Patient presents with  . Abdominal Pain    Gabriel Hamilton is a 50 y.o. male with a past medical history of GERD, hypertension presenting to the ED with a chief complaint of abdominal pain.  States that for the past few days has been having epigastric abdominal pain which feels like his GERD symptoms.  However this morning when he woke up his symptoms worsened.  States that he had some nausea and dry heaving.  He takes omeprazole 40 mg daily but states that this has not been helping him lately.  He is scheduled for an EGD in May.  He was told in the past that he has gastritis.  Denies any suspicious food intake or sick contacts with similar symptoms.  No changes to bowel movements or urination.  He has not tried any other medications help with the symptoms and did not take his omeprazole today.  No prior abdominal surgeries.  Reports daily alcohol use.  Denies any chest pain, shortness of breath, fever, back pain.  HPI     Past Medical History:  Diagnosis Date  . Acne   . Diabetes mellitus   . GERD (gastroesophageal reflux disease)   . Heart murmur   . Hypertension     Patient Active Problem List   Diagnosis Date Noted  . Overweight 11/16/2018  . 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 Type 2 diabetes mellitus with hyperglycemia, without long-term current use of insulin ([place]) 12/18/2015  . Ingrown nail 07/24/2015  . 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 Equinus deformity of foot, acquired 05/08/2015  . Onychomycosis 12/26/2014  . Hyperlipidemia, unspecified 06/06/2014  . Tachycardia 02/14/2014  . Acne   . Tobacco abuse 12/22/2011  . Polycythemia 08/26/2011  . HYPOGONADISM 11/21/2009  . Essential hypertension 07/10/2009  . GERD 03/27/2008  . HEART MURMUR 03/27/2008     History reviewed. No pertinent surgical history.     Family History  Problem Relation Age of Onset  . Diabetes Father   . Heart disease Father   . Hyperlipidemia Father   . Hypertension Father   . Arthritis Mother   . Cancer Mother        optic nerve    Social History   Tobacco Use  . Smoking status: Current Every Day Smoker    Packs/day: 0.50    Years: 22.00    Pack years: 11.00    Types: Cigarettes  . Smokeless tobacco: Never Used  Vaping Use  . Vaping Use: Never used  Substance Use Topics  . Alcohol use: Yes    Alcohol/week: 14.0 standard drinks    Types: 14 Standard drinks or equivalent per week  . Drug use: No    Home Medications Prior to Admission medications   Medication Sig Start Date End Date Taking? Authorizing Provider  sucralfate (CARAFATE) 1 GM/10ML suspension Take 10 mLs (1 g total) by mouth 4 (four) times daily -  with meals and at bedtime. 08/15/20  Yes Hajime Asfaw, PA-C  Accu-Chek FastClix Lancets MISC USE TO CHECK BLOOD SUGAR TWICE DAILY 11/16/18   Philemon Kingdom, MD  ACCU-CHEK GUIDE test strip USE AS INSTRUCTED TO CHECK SUGAR TWICE DAILY. 01/30/19   Philemon Kingdom, MD  ampicillin (PRINCIPEN) 500 MG capsule TAKE 1 CAPSULE BY MOUTH TWICE A DAY WITH FOOD Patient not taking:  Reported on 05/05/2020 01/31/19   [provider]  atorvastatin (LIPITOR) 40 MG tablet Take 1 tablet (40 mg total) by mouth once a week. Patient not taking: Reported on 05/05/2020 12/14/18   Marin Olp, MD  cyanocobalamin 1000 MCG tablet Take 1,000 mcg by mouth daily.     [provider]  dicyclomine (BENTYL) 20 MG tablet Take 1 tablet (20 mg total) by mouth 2 (two) times daily. Patient not taking: Reported on 05/05/2020 10/29/16   Barnet Glasgow, NP  doxycycline (VIBRAMYCIN) 100 MG capsule Take 100 mg by mouth daily. 03/06/20   [provider]  empagliflozin (JARDIANCE) 10 MG TABS tablet Take 10 mg by mouth daily before breakfast. Patient  not taking: Reported on 05/05/2020 09/11/19   Philemon Kingdom, MD  famotidine (PEPCID) 20 MG tablet Take 1 tablet (20 mg total) by mouth 2 (two) times daily. 05/05/20   Nuala Alpha A, PA-C  fluorometholone (FML) 0.1 % ophthalmic suspension Place 1 drop into both eyes 2 times daily. Patient not taking: Reported on 05/05/2020    [provider]  gabapentin (NEURONTIN) 100 MG capsule Take 1 capsule (100 mg total) by mouth 3 (three) times daily. Patient not taking: Reported on 05/05/2020 11/07/19   Edrick Kins, DPM  gentamicin cream (GARAMYCIN) 0.1 % Apply 1 application topically 2 (two) times daily. 09/14/18   Edrick Kins, DPM  glimepiride (AMARYL) 2 MG tablet Take 1 tablet (2 mg total) by mouth daily with breakfast. 07/03/19   Philemon Kingdom, MD  INVOKANA 100 MG TABS tablet Take 100 mg by mouth daily. 03/13/20   [provider]  JANUVIA 100 MG tablet TAKE 1 TABLET BY MOUTH EVERY DAY Patient not taking: Reported on 05/05/2020 07/25/19   Philemon Kingdom, MD  metFORMIN (GLUCOPHAGE-XR) 500 MG 24 hr tablet TAKE 4 TABLETS (2,000 MG TOTAL) BY MOUTH DAILY WITH BREAKFAST. Patient taking differently: Take 1,000 mg by mouth in the morning and at bedtime.  12/20/18   Philemon Kingdom, MD  nortriptyline (PAMELOR) 10 MG capsule Take 2 capsules (20 mg total) by mouth at bedtime. START NORTRIPTYLINE 10MG  AT BEDTIME FOR 2 WEEK, THEN INCREASE TO 2 CAPSULES AT BEDTIME Patient taking differently: Take 10 mg by mouth at bedtime.  04/23/20   Patel, Arvin Collard K, DO  Olopatadine HCl 0.2 % SOLN INSTILL 1 DROP INTO BOTH EYES EVERY DAY 07/17/20   [provider]  omeprazole (PRILOSEC) 40 MG capsule TAKE 1 CAPSULE BY MOUTH EVERY DAY Patient taking differently: Take 40 mg by mouth daily.  12/29/19   Marin Olp, MD  silver sulfADIAZINE (SILVADENE) 1 % cream Apply 1 application topically 2 (two) times daily.  04/24/20   [provider]  sulfamethoxazole-trimethoprim (BACTRIM DS)  800-160 MG tablet Take 1 tablet by mouth daily. 07/16/20   [provider]  terbinafine (LAMISIL) 250 MG tablet Take 1 tablet (250 mg total) by mouth daily. Patient not taking: Reported on 05/05/2020 09/14/18   Edrick Kins, DPM    Allergies    Benazepril  Review of Systems   Review of Systems  Constitutional: Negative for appetite change, chills and fever.  HENT: Negative for ear pain, rhinorrhea, sneezing and sore throat.   Eyes: Negative for photophobia and visual disturbance.  Respiratory: Negative for cough, chest tightness, shortness of breath and wheezing.   Cardiovascular: Negative for chest pain and palpitations.  Gastrointestinal: Positive for abdominal pain and nausea. Negative for blood in stool, constipation, diarrhea and vomiting.  Genitourinary: Negative for  dysuria, hematuria and urgency.  Musculoskeletal: Negative for myalgias.  Skin: Negative for rash.  Neurological: Negative for dizziness, weakness and light-headedness.    Physical Exam Updated Vital Signs BP 125/79   Pulse 86   Temp 97.8 F (36.6 C) (Oral)   Resp 18   Ht 5\' 8"  (1.727 m)   Wt 72.6 kg   SpO2 99%   BMI 24.33 kg/m   Physical Exam Vitals and nursing note reviewed.  Constitutional:      General: He is not in acute distress.    Appearance: He is well-developed and well-nourished.  HENT:     Head: Normocephalic and atraumatic.     Nose: Nose normal.  Eyes:     General: No scleral icterus.       Left eye: No discharge.     Extraocular Movements: EOM normal.     Conjunctiva/sclera: Conjunctivae normal.  Cardiovascular:     Rate and Rhythm: Normal rate and regular rhythm.     Pulses: Intact distal pulses.     Heart sounds: Normal heart sounds. No murmur heard. No friction rub. No gallop.   Pulmonary:     Effort: Pulmonary effort is normal. No respiratory distress.     Breath sounds: Normal breath sounds.  Abdominal:     General: Bowel sounds are normal. There is no  distension.     Palpations: Abdomen is soft.     Tenderness: There is no abdominal tenderness. There is no guarding.     Comments: Soft, nondistended, nontender.  Musculoskeletal:        General: No edema. Normal range of motion.     Cervical back: Normal range of motion and neck supple.  Skin:    General: Skin is warm and dry.     Findings: No rash.  Neurological:     Mental Status: He is alert.     Motor: No abnormal muscle tone.     Coordination: Coordination normal.  Psychiatric:        Mood and Affect: Mood and affect normal.     ED Results / Procedures / Treatments   Labs (all labs ordered are listed, but only abnormal results are displayed) Labs Reviewed  COMPREHENSIVE METABOLIC PANEL - Abnormal; Notable for the following components:      Result Value   Chloride 96 (*)    Glucose, Bld 165 (*)    All other components within normal limits  CBC - Abnormal; Notable for the following components:   RBC 5.89 (*)    Hemoglobin 18.5 (*)    HCT 56.2 (*)    RDW 17.7 (*)    All other components within normal limits  LIPASE, BLOOD    EKG None  Radiology No results found.  Procedures Procedures   Medications Ordered in ED Medications  famotidine (PEPCID) IVPB 20 mg premix (0 mg Intravenous Stopped 08/15/20 1101)  alum & mag hydroxide-simeth (MAALOX/MYLANTA) 200-200-20 MG/5ML suspension 30 mL (30 mLs Oral Given 08/15/20 1002)  ondansetron (ZOFRAN) injection 4 mg (4 mg Intravenous Given 08/15/20 1003)    ED Course  I have reviewed the triage vital signs and the nursing notes.  Pertinent labs & imaging results that were available during my care of the patient were reviewed by me and considered in my medical decision making (see chart for details).  Clinical Course as of 08/15/20 1137  Thu Aug 15, 2020  1004 Lipase: 43 [HK]    Clinical Course User Index [HK] Delia Heady, PA-C  MDM Rules/Calculators/A&P                          50 year old male with past medical  history of GERD, hypertension presenting to the ED with a chief complaint of abdominal pain.  For the past few days has been having epigastric abdominal pain that feels like GERD symptoms but worsened this morning.  Reports nausea and dry heaving.  Takes omeprazole 40 mg daily but has not been helping him lately.  Scheduled for EGD in May.  Denies any changes to bowel movements or urination.  On exam abdomen is soft, nontender nondistended.  He is not vomiting during my evaluation.  He appears in no distress.  Vital signs are within normal limits.  He denies chest pain or shortness of breath.  Lab work including CBC, CMP and lipase unremarkable.  Slight hemoconcentration.  Patient was given IV fluids, Maalox, Zofran and Pepcid with improvement in his symptoms.  On recheck patient able to tolerate p.o. intake without difficulty.  He does report some ongoing reflux symptoms and I suspect symptoms are due to his known history of gastritis.  Doubt pancreatitis, cholecystitis or other emergent or surgical cause based on his reassuring work-up and benign abdominal exam.  Repeat abdominal exams remain benign.  I had a discussion with the patient regarding recontacting his GI doctor to have an EGD done sooner.  I will add on Carafate to help with his symptoms and have him avoid foods that can trigger his GERD.  Informed him that he can return to the ER for any worsening symptoms.   Patient is hemodynamically stable, in NAD, and able to ambulate in the ED. Evaluation does not show pathology that would require ongoing emergent intervention or inpatient treatment. I explained the diagnosis to the patient. Pain has been managed and has no complaints prior to discharge. Patient is comfortable with above plan and is stable for discharge at this time. All questions were answered prior to disposition. Strict return precautions for returning to the ED were discussed. Encouraged follow up with PCP.   An After Visit Summary was  printed and given to the patient.   Portions of this note were generated with [employer]. Dictation errors may occur despite best attempts at proofreading.  Final Clinical Impression(s) / ED Diagnoses Final diagnoses:  Gastroesophageal reflux disease, unspecified whether esophagitis present    Rx / DC Orders ED Discharge Orders         Ordered    sucralfate (CARAFATE) 1 GM/10ML suspension  3 times daily with meals & bedtime        08/15/20 1137           Delia Heady, PA-C 08/15/20 1137    Tegeler, Gwenyth Allegra, MD 08/15/20 [id number]

## 2020-11-01 ENCOUNTER — Other Ambulatory Visit: Payer: Self-pay | Admitting: Neurology

## 2021-01-14 ENCOUNTER — Other Ambulatory Visit: Payer: Self-pay | Admitting: Family Medicine

## 2021-01-29 ENCOUNTER — Ambulatory Visit (INDEPENDENT_AMBULATORY_CARE_PROVIDER_SITE_OTHER): Payer: 59 | Admitting: Student

## 2021-01-29 VITALS — BP 108/77 | HR 78 | Temp 98.0°F | Ht 68.0 in | Wt 170.9 lb

## 2021-01-29 DIAGNOSIS — I10 Essential (primary) hypertension: Secondary | ICD-10-CM

## 2021-01-29 DIAGNOSIS — G8929 Other chronic pain: Secondary | ICD-10-CM

## 2021-01-29 DIAGNOSIS — M25562 Pain in left knee: Secondary | ICD-10-CM | POA: Insufficient documentation

## 2021-01-29 DIAGNOSIS — M25561 Pain in right knee: Secondary | ICD-10-CM

## 2021-01-29 DIAGNOSIS — R6881 Early satiety: Secondary | ICD-10-CM | POA: Diagnosis not present

## 2021-01-29 DIAGNOSIS — Z720 Tobacco use: Secondary | ICD-10-CM

## 2021-01-29 DIAGNOSIS — E1165 Type 2 diabetes mellitus with hyperglycemia: Secondary | ICD-10-CM | POA: Diagnosis not present

## 2021-01-29 LAB — POCT GLYCOSYLATED HEMOGLOBIN (HGB A1C): Hemoglobin A1C: 5.3 % (ref 4.0–5.6)

## 2021-01-29 LAB — GLUCOSE, CAPILLARY: Glucose-Capillary: 135 mg/dL — ABNORMAL HIGH (ref 70–99)

## 2021-01-29 NOTE — Assessment & Plan Note (Addendum)
Assessment: A1c of 6.1-year ago, repeat A1c today of 5.3.  Current medication regimen of Jardiance 10 mg, metformin 1000 mg daily, glimepiride 2 mg daily.  Patient denies episodes of hypoglycemia.  Discussed with patient that we will continue to monitor his sugar levels and if he continues to have A1c below 6, will plan to discontinue his glimepiride at the next visit.  Plan: -Continue current regimen of Jardiance 10 mg, metformin 1000 mg and glimepiride 2 mg daily. -Repeat A1c in 1 year.  If patient continues to do well or endorses episodes of hypoglycemia, will discontinue glimepiride -Urine microalbumin to creatinine ratio pending  Addendum: Urine microalbum/creat negative

## 2021-01-29 NOTE — Progress Notes (Signed)
New Patient Office Visit  Subjective:  Patient ID: Gabriel Hamilton, male    DOB: 1970-10-12  Age: 50 y.o. MRN: [id number]  CC: Early satiety, joint pain  HPI Gabriel Hamilton is a 50 year old gentleman with a past medical history of type 2 diabetes, acid reflux, lower extremity neuropathy who presents to establish care at our clinic.  [name] to healthcare goals are to continue to find solutions to his symptoms of early satiety as well as bilateral knee and hip pain.  Past Medical History:  Diagnosis Date   Acne    Diabetes mellitus    GERD (gastroesophageal reflux disease)    Heart murmur    Hypertension   Neuropathy  Medications Omeprazole 40 mg daily Glimepiride 2 mg daily Jardiance 10 mg daily Cyanocobalamin Metformin 500 mg twice daily Celecoxib  No past surgical history on file.  Family History  Problem Relation Age of Onset   Diabetes Father    Heart disease Father    Hyperlipidemia Father    Hypertension Father    Arthritis Mother    Cancer Mother        optic nerve    Social History   Socioeconomic History   Marital status: Married    Spouse name: Not on file   Number of children: Not on file   Years of education: Not on file   Highest education level: Not on file  Occupational History   Not on file  Tobacco Use   Smoking status: Every Day    Packs/day: 0.50    Years: 22.00    Pack years: 11.00    Types: Cigarettes   Smokeless tobacco: Never  Vaping Use   Vaping Use: Never used  Substance and Sexual Activity   Alcohol use: Yes    Alcohol/week: 14.0 standard drinks    Types: 14 Standard drinks or equivalent per week   Drug use: No   Sexual activity: Yes  Other Topics Concern   Not on file  Social History Narrative   Married for 8 years in 2015. Wife with 2 sons from previous marriage. 3 grandsons. 1 niece. Close family.       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)      Hobbies: enjoys reading and audiobooks  (likes author Garret Reddish), enjoys music and movies         Right handed   One story home   Occasionally caffeine   Social Determinants of Health   Financial Resource Strain: Not on file  Food Insecurity: Not on file  Transportation Needs: Not on file  Physical Activity: Not on file  Stress: Not on file  Social Connections: Not on file  Intimate Partner Violence: Not on file   ROS Please see problem list for review of systems  Objective:   Today's Vitals: Ht '5\' 8"'$  (1.727 m)   Wt 170 lb 14.4 oz (77.5 kg)   BMI 25.99 kg/m   Physical Exam Constitutional: well-appearing, no acute distress HENT: normocephalic atraumatic Eyes: conjunctiva non-erythematous Neck: supple Cardiovascular: regular rate and rhythm, no m/r/g Pulmonary/Chest: normal work of breathing on room air, lungs clear to auscultation bilaterally Abdominal: soft, non-tender, non-distended. + BS MSK: normal bulk and tone Neurological: alert & oriented x 3, 5/5, 2+ patellar reflexes.  Skin: warm and dry Psych: Normal mood and thought process  Assessment & Plan:   Please see problem list for problem based assessment and plan   Follow-up: No follow-ups on  file.   [place] [facility]  Internal Medicine Resident PGY-2 [place]  Pager: [phone]

## 2021-01-29 NOTE — Assessment & Plan Note (Signed)
Assessment: Patient smoked half pack per day.  States that he continues to try to quit.  Denies needing assistance at this time but will reach out to our clinic as needed.  Plan: -Continued tobacco cessation at future visits

## 2021-01-29 NOTE — Assessment & Plan Note (Addendum)
Assessment: Patient with history of early satiety.  He states that he has had this for some time and that he has been seen by gastroenterology for this.  They performed an endoscopy as well as took biopsy samples, biopsy results negative for H. pylori.  Were suggestive of chronic gastritis..  Patient states that the procedure came back negative for any etiology and also was negative for H. pylori.  Patient notes that during this time his omeprazole was increased to 40 mg daily.  Since this occurred patient states that satiety is happening less frequently.  He states that it seems to only occur with foods that fall into the category of protein.  States that sometimes he is able to eat certain things and other times he has not.  He did endorse some pain with early satiety but this is since resolved since he increased his omeprazole to 40 mg daily.  Does endorse some weight loss from this.  Overall feels as though his symptoms have improved and I suspect that they will continue to improve.  Suspect that this is secondary to his underlying acid reflux.  Encouraged the patient to continue to follow-up with his GI doctors.  Differential also includes gastroparesis secondary to diabetes.  Denies feeling as though food is stuck in his throat and on EGD he was without esophageal abnormalities.  Encourage patient to continue working on stopping smoking and decreasing his alcohol intake at this is more than likely contributing to his gastritis.  Plan: -Continue to follow with GI, continue omeprazole 40 mg daily

## 2021-01-29 NOTE — Assessment & Plan Note (Signed)
BP well controlled with medications.

## 2021-01-29 NOTE — Patient Instructions (Signed)
Thank you, Mr.Kamen C Oravec for allowing [place] to provide your care today. Today we discussed   Knee Pain Please continue to follow with sports medicine for steroid injections every 6 months or so.   Feeling Full Early I am glad that this is happening less frequently. You have had a thorough workup by the GI doctors for this and thankfully it sounds like the endoscopy they did was normal. I will be looking further into their notes, but I believe if it is happening less frequently you will continue to improve. If you notice it occur more often than not, please call our clinic or your GI doctors to be seen.   I have ordered the following labs for you:   Lab Orders  Glucose, capillary  POC Hbg A1C     Referrals ordered today:   Referral Orders  No referral(s) requested today     I have ordered the following medication/changed the following medications:   Stop the following medications: There are no discontinued medications.   Start the following medications: No orders of the defined types were placed in this encounter.    Follow up: 4-6 months as needed  Should you have any questions or concerns please call the internal medicine clinic at [phone].     Sanjuana Letters, D.O. [place]

## 2021-01-29 NOTE — Assessment & Plan Note (Addendum)
Assessment: Patient states he has had bilateral knee pain for the past few years.  His last imaging was of his left knee in 2018 which showed mild degenerative changes.  Patient states that the pain is worse throughout the day around the afternoon time.  Discussed the symptoms as well as the findings of mild degenerative changes that I suspect this is more of an osteoarthritis than her rheumatoid arthritis.  ANA has been positive in the past however RF was negative.  Patient follows with sports medicine who gave him steroid injections in the knee every 6 months.  Courage patient to continue with this.  If pain persists and is limiting his activities of daily living can pursue imaging but do not believe that that is warranted at this time.  Plan: -Continue to follow with sports medicine and receive steroid injections

## 2021-01-30 LAB — MICROALBUMIN / CREATININE URINE RATIO
Creatinine, Urine: 101.2 mg/dL
Microalb/Creat Ratio: 8 mg/g creat (ref 0–29)
Microalbumin, Urine: 8 ug/mL

## 2021-02-03 ENCOUNTER — Ambulatory Visit: Payer: 59 | Admitting: Podiatry

## 2021-02-04 NOTE — Progress Notes (Signed)
Internal Medicine Clinic Attending  Case discussed with Dr. Katsadouros  At the time of the visit.  We reviewed the resident's history and exam and pertinent patient test results.  I agree with the assessment, diagnosis, and plan of care documented in the resident's note.  

## 2021-04-08 ENCOUNTER — Telehealth: Payer: Self-pay

## 2021-04-08 NOTE — Telephone Encounter (Signed)
Patient is scheduled   

## 2021-04-08 NOTE — Telephone Encounter (Signed)
Patient is calling in stating that he wants to re-establish with Dr.Hunter, had to transfer out due to insurance but recently changed insurances so he can try to come back.

## 2021-04-08 NOTE — Telephone Encounter (Signed)
See below

## 2021-04-08 NOTE — Telephone Encounter (Signed)
Yes thanks 

## 2021-04-08 NOTE — Telephone Encounter (Signed)
Ok to re-establish 

## 2021-08-06 ENCOUNTER — Encounter: Payer: Self-pay | Admitting: Orthopedic Surgery

## 2021-08-06 ENCOUNTER — Other Ambulatory Visit: Payer: Self-pay

## 2021-08-06 ENCOUNTER — Ambulatory Visit: Payer: Self-pay

## 2021-08-06 ENCOUNTER — Ambulatory Visit (INDEPENDENT_AMBULATORY_CARE_PROVIDER_SITE_OTHER): Payer: 59

## 2021-08-06 ENCOUNTER — Ambulatory Visit (INDEPENDENT_AMBULATORY_CARE_PROVIDER_SITE_OTHER): Payer: 59 | Admitting: Orthopedic Surgery

## 2021-08-06 DIAGNOSIS — R200 Anesthesia of skin: Secondary | ICD-10-CM | POA: Diagnosis not present

## 2021-08-06 DIAGNOSIS — G8929 Other chronic pain: Secondary | ICD-10-CM | POA: Diagnosis not present

## 2021-08-06 DIAGNOSIS — M79672 Pain in left foot: Secondary | ICD-10-CM

## 2021-08-06 DIAGNOSIS — M25561 Pain in right knee: Secondary | ICD-10-CM

## 2021-08-06 DIAGNOSIS — M25562 Pain in left knee: Secondary | ICD-10-CM | POA: Diagnosis not present

## 2021-08-06 DIAGNOSIS — M79671 Pain in right foot: Secondary | ICD-10-CM

## 2021-08-06 LAB — CBC WITH DIFFERENTIAL/PLATELET
Absolute Monocytes: 376 cells/uL (ref 200–950)
Basophils Absolute: 17 cells/uL (ref 0–200)
Basophils Relative: 0.3 %
Eosinophils Absolute: 211 cells/uL (ref 15–500)
Eosinophils Relative: 3.7 %
HCT: 58.1 % — ABNORMAL HIGH (ref 38.5–50.0)
Hemoglobin: 19.9 g/dL — ABNORMAL HIGH (ref 13.2–17.1)
Lymphs Abs: 2326 cells/uL (ref 850–3900)
MCH: 32.9 pg (ref 27.0–33.0)
MCHC: 34.3 g/dL (ref 32.0–36.0)
MCV: 96 fL (ref 80.0–100.0)
MPV: 11.6 fL (ref 7.5–12.5)
Monocytes Relative: 6.6 %
Neutro Abs: 2770 cells/uL (ref 1500–7800)
Neutrophils Relative %: 48.6 %
Platelets: 326 10*3/uL (ref 140–400)
RBC: 6.05 10*6/uL — ABNORMAL HIGH (ref 4.20–5.80)
RDW: 13.1 % (ref 11.0–15.0)
Total Lymphocyte: 40.8 %
WBC: 5.7 10*3/uL (ref 3.8–10.8)

## 2021-08-06 NOTE — Progress Notes (Signed)
Office Visit Note   Patient: Gabriel Hamilton           Date of Birth: December 18, 1970           MRN: [id number] Visit Date: 08/06/2021 Requested by: Marianna Payment, MD 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  He has had an injection in both knees in the past which has only helped him for a day.  Denies any upper extremity symptoms.  Denies much in the way of neck pain.  He does have diabetes and takes vitamin B12 because of neuropathy in his feet.  His coworkers have seen him limping around.  Denies much in the way of groin pain although he does report a groin pull several months ago which resolved.  His left knee hurts slightly more than the right knee.  Denies much in the way of mechanical symptoms.  He has had a rheumatologic work-up but that did not include blood work it was only a physical exam.  No family or personal history of gout or pseudogout.              ROS: All systems reviewed are negative as they relate to the chief complaint within the history of present illness.  Patient denies  fevers or chills.   Assessment & Plan: Visit Diagnoses:  1. Chronic pain of both knees   2. Bilateral foot pain   3. Bilateral leg numbness     Plan: Impression is bilateral foot ankle and knee pain.  Radiographs unremarkable.  Examination unremarkable.  Several possibilities exist to explain some of the symptoms.  I think vitamin D deficiency could be at play.  Also he may have polymyalgia rheumatica.  He has not had a steroid trial yet.  He is taking anti-inflammatory such as Celebrex as well as Neurontin for his neuropathy.  Plan at this time is rheumatologic screening labs plus vitamin D panel plus uric acid.  Also MRI  left knee so were not missing any thing obvious in terms of structural pathology.  Follow-up after that study.  Follow-Up Instructions: Return for after MRI.   Orders:  Orders Placed This Encounter  Procedures   XR Foot Complete Right   XR Foot Complete Left   XR Knee 1-2 Views Right   XR Knee 1-2 Views Left   XR Lumbar Spine 2-3 Views   No orders of the defined types were placed in this encounter.     Procedures: No procedures performed   Clinical Data: No additional findings.  Objective: Vital Signs: There were no vitals taken for this visit.  Physical Exam:   Constitutional: Patient appears well-developed HEENT:  Head: Normocephalic Eyes:EOM are normal Neck: Normal range of motion Cardiovascular: Normal rate Pulmonary/chest: Effort normal Neurologic: Patient is alert Skin: Skin is warm Psychiatric: Patient has normal mood and affect   Ortho Exam: Ortho exam demonstrates normal gait alignment.  Palpable pedal pulses.  Symmetric tibiotalar subtalar transverse tarsal range of motion.  Palpable intact nontender anterior to posterior to peroneal and Achilles tendons.  Ankle range of motion is full in the tibiotalar plane.  Knee range of motion also full with no effusion patellofemoral crepitus or extensor  mechanism deficiency.  Collateral and cruciate ligaments are stable.  No other masses lymphadenopathy or skin changes noted in that right or left knee region.  No groin pain with internal/external rotation of either leg.  No definite paresthesias L1-S1 bilaterally.  Negative clonus negative Babinski.  Specialty Comments:  No specialty comments available.  Imaging: XR Foot Complete Left  Result Date: 08/06/2021 AP lateral oblique radiographs left foot reviewed.  Tarsometatarsal alignment intact.  No acute fracture.  No significant midfoot arthritis or phalangeal arthritis is present.  Normal radiographs left foot  XR Knee 1-2 Views Left  Result Date: 08/06/2021 AP  lateral radiographs left knee reviewed.  No acute fracture.  Mild varus alignment.  Minimal joint space narrowing on the medial side without osteophytes or sclerosis or spurring.  Lateral and patellofemoral compartments appear intact.  XR Foot Complete Right  Result Date: 08/06/2021 AP lateral oblique radiographs right foot reviewed.  Tarsometatarsal alignment intact.  No acute fracture.  Mild midfoot degenerative changes noted.  No significant arthritis in the phalangeal joints.  XR Knee 1-2 Views Right  Result Date: 08/06/2021 AP lateral radiographs right knee reviewed.  No acute fracture.  Mild varus alignment.  No significant osteoarthritis.  Only very mild medial joint space narrowing noted with no osteophytes spurring or sclerosis on that side.  XR Lumbar Spine 2-3 Views  Result Date: 08/06/2021 AP lateral lumbar spine radiographs reviewed.  Normal lordosis is present no acute fracture.  No spondylolisthesis.  Joint spaces maintained.  Visualized hips without significant osteoarthritis or joint space narrowing.    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   Type 2 diabetes mellitus with hyperglycemia, without long-term current use of insulin ([place]) 12/18/2015   Ingrown nail 07/24/2015   Pronation deformity of both feet 05/22/2015   Metatarsal deformity 05/08/2015   Equinus deformity of foot, acquired 05/08/2015   Onychomycosis 12/26/2014   Hyperlipidemia, unspecified 06/06/2014   Tachycardia 02/14/2014   Acne    Tobacco abuse 12/22/2011   Polycythemia 08/26/2011   HYPOGONADISM 11/21/2009   Essential hypertension 07/10/2009   GERD 03/27/2008   HEART MURMUR 03/27/2008   Past Medical History:  Diagnosis Date   Acne    Diabetes mellitus    GERD (gastroesophageal reflux disease)    Heart murmur    Hypertension      Family History  Problem Relation Age of Onset   Diabetes Father    Heart disease Father    Hyperlipidemia Father    Hypertension Father    Arthritis Mother    Cancer Mother        optic nerve    History reviewed. No pertinent surgical history. Social History   Occupational History   Not on file  Tobacco Use   Smoking status: Every Day    Packs/day: 0.50    Years: 22.00    Pack years: 11.00    Types: Cigarettes   Smokeless tobacco: Never  Vaping Use   Vaping Use: Never used  Substance and Sexual Activity   Alcohol use: Yes    Alcohol/week: 14.0 standard drinks    Types: 14 Standard drinks or equivalent per week   Drug use: No   Sexual activity: Yes

## 2021-08-06 NOTE — Addendum Note (Signed)
Addended byLaurann Montana on: 08/06/2021 11:18 AM   Modules accepted: Orders

## 2021-08-06 NOTE — Addendum Note (Signed)
Addended byLaurann Montana on: 08/06/2021 10:13 AM   Modules accepted: Orders

## 2021-08-06 NOTE — Addendum Note (Signed)
Addended byLaurann Montana on: 08/06/2021 10:09 AM   Modules accepted: Orders

## 2021-08-08 ENCOUNTER — Telehealth: Payer: Self-pay | Admitting: Orthopedic Surgery

## 2021-08-08 LAB — URIC ACID: Uric Acid, Serum: 4.9 mg/dL (ref 4.0–8.0)

## 2021-08-08 LAB — SEDIMENTATION RATE: Sed Rate: 28 mm/h — ABNORMAL HIGH (ref 0–20)

## 2021-08-08 LAB — CYCLIC CITRUL PEPTIDE ANTIBODY, IGG: Cyclic Citrullin Peptide Ab: <16 UNITS

## 2021-08-08 LAB — RHEUMATOID FACTOR: Rheumatoid fact SerPl-aCnc: <14 IU/mL (ref <14)

## 2021-08-08 LAB — VITAMIN D 25 HYDROXY (VIT D DEFICIENCY, FRACTURES): Vit D, 25-Hydroxy: 14 ng/mL — ABNORMAL LOW (ref 30–100)

## 2021-08-08 LAB — C-REACTIVE PROTEIN: CRP: 2.6 mg/L (ref <8.0)

## 2021-08-08 LAB — ANA: Anti Nuclear Antibody (ANA): NEGATIVE

## 2021-08-08 NOTE — Progress Notes (Signed)
Does he have follow-up appointment with [place].  I think his hemochromatosis could be causing his joint pain.  He should have follow-up after MRI scan.

## 2021-08-08 NOTE — Progress Notes (Signed)
Tried calling patient to advise. No answer. Will try again later.

## 2021-08-08 NOTE — Telephone Encounter (Signed)
I spoke with the patient, giving him the phone number to [facility]. He said he will give them a call to schedule the MRI (has already been authorized by [employer]), and then call us back to schedule the follow up visit with Dr. Marlou Sa for his results (of the MRI and the bloodwork).

## 2021-08-08 NOTE — Telephone Encounter (Signed)
Pt called requesting a call concerning blood work results. Also transferred to [place] about MRI referral update. Please call pt at [phone] before 1 pm if after 1 pm call [phone].

## 2021-08-09 NOTE — Progress Notes (Signed)
His vitamin D is low.  This could be causing some of his joint pain.  Please call in 50,000 units by mouth once a week for the next 8 weeks.  Thank you

## 2021-08-11 ENCOUNTER — Encounter: Payer: Self-pay | Admitting: Orthopedic Surgery

## 2021-08-11 MED ORDER — VITAMIN D (ERGOCALCIFEROL) 1.25 MG (50000 UNIT) PO CAPS: 50000.0000 [IU] | ORAL_CAPSULE | ORAL | 0 refills | Status: DC

## 2021-08-11 NOTE — Progress Notes (Signed)
Patient coming into see Dr Marlou Sa tomorrow

## 2021-08-11 NOTE — Telephone Encounter (Signed)
Ok to see am

## 2021-08-11 NOTE — Progress Notes (Signed)
Thx for that

## 2021-08-11 NOTE — Addendum Note (Signed)
Addended byLaurann Montana on: 08/11/2021 09:02 AM   Modules accepted: Orders

## 2021-08-12 ENCOUNTER — Telehealth: Payer: Self-pay

## 2021-08-12 ENCOUNTER — Encounter: Payer: Self-pay | Admitting: Orthopedic Surgery

## 2021-08-12 ENCOUNTER — Other Ambulatory Visit: Payer: Self-pay

## 2021-08-12 ENCOUNTER — Ambulatory Visit (INDEPENDENT_AMBULATORY_CARE_PROVIDER_SITE_OTHER): Payer: 59 | Admitting: Orthopedic Surgery

## 2021-08-12 DIAGNOSIS — M25571 Pain in right ankle and joints of right foot: Secondary | ICD-10-CM | POA: Diagnosis not present

## 2021-08-12 DIAGNOSIS — M25562 Pain in left knee: Secondary | ICD-10-CM

## 2021-08-12 DIAGNOSIS — M25561 Pain in right knee: Secondary | ICD-10-CM

## 2021-08-12 DIAGNOSIS — M25572 Pain in left ankle and joints of left foot: Secondary | ICD-10-CM

## 2021-08-12 DIAGNOSIS — E559 Vitamin D deficiency, unspecified: Secondary | ICD-10-CM | POA: Diagnosis not present

## 2021-08-12 NOTE — Telephone Encounter (Signed)
Patient having issues getting MRI approved per patients report at his appt today with Dr Marlou Sa.  Can you check on this for patient? Dr Marlou Sa asking if could be done somewhere else that insurance would cover (ex-[facility])

## 2021-08-12 NOTE — Progress Notes (Signed)
Office Visit Note   Patient: Gabriel Hamilton           Date of Birth: 08/25/1970           MRN: [id number] Visit Date: 08/12/2021 Requested by: Marianna Payment, MD [address] [place],  [place] [address] PCP: Marianna Payment, MD  Subjective: Chief Complaint  Patient presents with   Other    Follow up to discuss MRI denial/labs/treatment plan    HPI: Gabriel Hamilton is a 51 year old patient with bilateral ankle and bilateral knee pain.  Left knee hurts more than the right.  MRI scan has been approved but it cannot be done at [facility] imaging.  Since he was last seen he has had lab work which demonstrates increased hemoglobin and hematocrit consistent with known diagnosis of PCV.  Rheumatoid arthritis labs were negative.  He did have low vitamin D at 14.  He is scheduled for phlebotomy tomorrow.              ROS: All systems reviewed are negative as they relate to the chief complaint within the history of present illness.  Patient denies  fevers or chills.   Assessment & Plan: Visit Diagnoses:  1. Vitamin D deficiency     Plan: Impression is right and left knee and ankle pain likely related to vitamin D deficiency.  Still is having enough pain in that left knee that MRI scanning is indicated.  I think it will likely show stress reaction in the distal femur and tibial plateau.  Plan vitamin D supplementation 50,000 units by mouth daily for 8 weeks.  4-week return for clinical recheck.  Should have some improvement in the joint symptoms at that time.  Follow-Up Instructions: Return in about 4 weeks (around 09/09/2021).   Orders:  No orders of the defined types were placed in this encounter.  No orders of the defined types were placed in this encounter.     Procedures: No procedures performed   Clinical Data: No additional findings.  Objective: Vital Signs: There were no vitals taken for this visit.  Physical Exam:   Constitutional: Patient appears well-developed HEENT:   Head: Normocephalic Eyes:EOM are normal Neck: Normal range of motion Cardiovascular: Normal rate Pulmonary/chest: Effort normal Neurologic: Patient is alert Skin: Skin is warm Psychiatric: Patient has normal mood and affect   Ortho Exam: Ortho exam demonstrates full active and passive range of motion of ankles and knees.  No limp.  Rest of his exam is unchanged.  No upper extremity symptoms.  Specialty Comments:  No specialty comments available.  Imaging: No results found.   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   Type 2 diabetes mellitus with hyperglycemia, without long-term current use of insulin ([place]) 12/18/2015   Ingrown nail 07/24/2015   Pronation deformity of both feet 05/22/2015   Metatarsal deformity 05/08/2015   Equinus deformity of foot, acquired 05/08/2015   Onychomycosis 12/26/2014   Hyperlipidemia, unspecified 06/06/2014   Tachycardia 02/14/2014   Acne    Tobacco abuse 12/22/2011   Polycythemia 08/26/2011   HYPOGONADISM 11/21/2009   Essential hypertension 07/10/2009   GERD 03/27/2008   HEART MURMUR 03/27/2008   Past Medical History:  Diagnosis Date   Acne    Diabetes mellitus    GERD (gastroesophageal reflux disease)    Heart murmur  Hypertension     Family History  Problem Relation Age of Onset   Diabetes Father    Heart disease Father    Hyperlipidemia Father    Hypertension Father    Arthritis Mother    Cancer Mother        optic nerve    History reviewed. No pertinent surgical history. Social History   Occupational History   Not on file  Tobacco Use   Smoking status: Every Day    Packs/day: 0.50    Years: 22.00    Pack years: 11.00    Types: Cigarettes   Smokeless tobacco: Never  Vaping Use   Vaping Use: Never used  Substance and Sexual Activity   Alcohol use:  Yes    Alcohol/week: 14.0 standard drinks    Types: 14 Standard drinks or equivalent per week   Drug use: No   Sexual activity: Yes

## 2021-08-18 NOTE — Telephone Encounter (Signed)
Pt insurance requires him to [facility] to [facility] to have done, order was sent to [facility], they will contact him to schdule

## 2021-09-01 ENCOUNTER — Other Ambulatory Visit: Payer: Self-pay | Admitting: Orthopedic Surgery

## 2021-09-01 NOTE — Telephone Encounter (Signed)
Just needs to transition to vitamin d OTC supplement daily

## 2021-09-01 NOTE — Progress Notes (Incomplete)
Phone: [phone]   Subjective:  Patient presents today for their annual physical. Chief complaint-noted.   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) 12/18/2015   Ingrown nail 07/24/2015   Pronation deformity of both feet 05/22/2015   Metatarsal deformity 05/08/2015   Equinus deformity of foot, acquired 05/08/2015   Onychomycosis 12/26/2014   Hyperlipidemia, unspecified 06/06/2014   Tachycardia 02/14/2014   Acne    Tobacco abuse 12/22/2011   Polycythemia 08/26/2011   HYPOGONADISM 11/21/2009   Essential hypertension 07/10/2009   GERD 03/27/2008   HEART MURMUR 03/27/2008   No past surgical history on file.  Family History  Problem Relation Age of Onset   Diabetes Father    Heart disease Father    Hyperlipidemia Father    Hypertension Father    Arthritis Mother    Cancer Mother        optic nerve    Medications- reviewed and updated Current Outpatient Medications  Medication Sig Dispense Refill   Accu-Chek FastClix Lancets MISC USE TO CHECK BLOOD SUGAR TWICE DAILY 204 each 0   ACCU-CHEK GUIDE test strip USE AS INSTRUCTED TO CHECK SUGAR TWICE DAILY. 100 strip 5   atorvastatin (LIPITOR) 40 MG tablet Take 1 tablet (40 mg total) by mouth once a week. (Patient not taking: Reported on 05/05/2020) 13 tablet 3   cyanocobalamin 1000 MCG tablet Take 1,000 mcg by mouth daily.      dicyclomine (BENTYL) 20 MG tablet Take 1 tablet  (20 mg total) by mouth 2 (two) times daily. (Patient not taking: Reported on 05/05/2020) 20 tablet 0   empagliflozin (JARDIANCE) 10 MG TABS tablet Take 10 mg by mouth daily before breakfast. (Patient not taking: Reported on 05/05/2020) 30 tablet 11   fluorometholone (FML) 0.1 % ophthalmic suspension Place 1 drop into both eyes 2 times daily. (Patient not taking: Reported on 05/05/2020)     glimepiride (AMARYL) 2 MG tablet Take 1 tablet (2 mg total) by mouth daily with breakfast. 90 tablet 1   metFORMIN (GLUCOPHAGE-XR) 500 MG 24 hr tablet TAKE 4 TABLETS (2,000 MG TOTAL) BY MOUTH DAILY WITH BREAKFAST. (Patient taking differently: Take 1,000 mg by mouth in the morning and at bedtime. ) 360 tablet 3   Olopatadine HCl 0.2 % SOLN INSTILL 1 DROP INTO BOTH EYES EVERY DAY     omeprazole (PRILOSEC) 40 MG capsule TAKE 1 CAPSULE BY MOUTH EVERY DAY (Patient taking differently: Take 40 mg by mouth daily. ) 270 capsule 1   silver sulfADIAZINE (SILVADENE) 1 % cream Apply 1 application topically 2 (two) times daily.      sucralfate (CARAFATE) 1 GM/10ML suspension Take 10 mLs (1 g total) by mouth 4 (four) times daily -  with meals and at bedtime. 420 mL 0   Vitamin D, Ergocalciferol, (DRISDOL) 1.25 MG (50000 UNIT) CAPS capsule Take 1 capsule (50,000 Units total) by mouth every 7 (seven) days. 8 capsule 0  No current facility-administered medications for this visit.    Allergies-reviewed and updated Allergies  Allergen Reactions   Benazepril     May have caused angioedema    Social History   Social History Narrative   Married for 8 years in 2015. Wife with 2 sons from previous marriage. 3 grandsons. 1 niece. Close family.       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 Gen: NAD, resting comfortably HEENT: Mucous membranes are moist. Oropharynx normal Neck: no thyromegaly CV: RRR no murmurs rubs or gallops Lungs: CTAB no crackles, wheeze, rhonchi Abdomen: soft/nontender/nondistended/normal bowel sounds. No rebound or guarding.  Ext: no edema Skin: warm, dry Neuro: grossly normal, moves all extremities, PERRLA ***   Assessment and Plan  51 y.o. male presenting for annual physical.  Health Maintenance counseling: 1. Anticipatory guidance: Patient counseled regarding regular dental exams ***q6 months, eye exams ***yearly,  avoiding smoking and second hand smoke- encouraged cessation *** , limiting alcohol to 2 beverages per day ***.  No illicit drugs - *** 2. Risk factor reduction:  Advised patient of need for regular exercise and diet rich and fruits and vegetables to reduce risk of heart attack and stroke.  Exercise-active around home- recommended 150 minutes (hes up to 120 right now)exercise per week- his MMA stuff and [employer] options are not working.  ***.  Diet/weight management-trying to eat reasonably healthy- Dr. Cruzita Lederer wants him more plant based but he loves grilling out. ***.  Wt Readings from Last 3 Encounters:  01/29/21 170 lb 14.4 oz (77.5 kg)  08/15/20 160 lb (72.6 kg)  05/05/20 165 lb (74.8 kg)   3. Immunizations/screenings/ancillary studies DISCUSSED: -COVID booster vaccination #5- *** -Shingrix vaccination - *** -Vision exam- *** -Foot exam - *** -Hepatitis C Screening - *** Immunization History  Administered Date(s) Administered   PFIZER(Purple Top)SARS-COV-2 Vaccination 09/30/2019, 10/25/2019   Pneumococcal Polysaccharide-23 06/06/2008, 02/14/2014   Td 03/22/2008   Tdap 06/08/2018   Health Maintenance Due  Topic Date Due   Hepatitis C Screening  Never done   COLONOSCOPY (Pts 45-25yr Insurance coverage will need to be confirmed)  Never done   OPHTHALMOLOGY EXAM  08/31/2019   Zoster Vaccines- Shingrix (1 of 2)  Never done   FOOT EXAM  12/14/2019   COVID-19 Vaccine (5 - Booster) 12/20/2019   HEMOGLOBIN A1C  08/01/2021   4. Prostate cancer screening-  PSA today- trend has been low risk.  ***  Lab Results  Component Value Date   PSA 0.75 12/14/2018   PSA 0.82 06/09/2017   PSA 0.66 06/26/2015   5. Colon cancer screening - no family history. He is aware of some guidelines shifting to age 2157and would prefer to go ahead. We placed referral and he will check with insurance*** 6. Skin cancer screening- lower risk due to level of melanin***advised regular sunscreen use. Denies worrisome, changing, or new skin lesions.  7. Smoking associated screening (lung cancer screening, AAA screen 65-75, UA)- current smoker- will get ua  *** 8. STD screening -patient opts out as monogomous ***  Status of chronic or acute concerns   #Diabetes - followed with Dr. GCruzita Lederer S:   metformin 500 mg  anf amaryl 2 mg  - invokana and januvia in the past.  CBGs- *** Exercise and diet- *** Lab Results  Component Value Date   HGBA1C 5.3 01/29/2021   HGBA1C 6.1 (A) 03/22/2019   HGBA1C 6.5 (A) 11/16/2018    A/P: ***   #Hypertriglyceridemia in past- had been on atorvastatin in the past- was doing once a week but stopped    # GERD S:Medication:  on prilosec '40mg'$ - failed '20mg'$ . 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 Advised cessation -he was not ready. Secondary polycythemia as a result. Had been donating to [employer] [facility] regularly - got as low as 17.    #hypertension S:  controlled without BP meds since being on invokana.  Home readings #s: *** BP Readings from Last 3 Encounters:  01/29/21 108/77  08/15/20 121/74  05/05/20 133/90  A/P: ***  #hyperlipidemia S: Medication:atorvastatin 40 mg  Lab Results  Component Value Date   CHOL 146 12/14/2018   HDL 60.00 12/14/2018   LDLCALC 57 12/14/2018    LDLDIRECT 57.0 06/08/2018   TRIG 146.0 12/14/2018   CHOLHDL 2 12/14/2018   A/P: ***   #AC joint pain on left improved after last visit   #Used meloxicam for his ankles- reportedly podiatry stated more nerve related issues. Used ankle brace at times too   Being treated by podiatry for onychomycosis    Doing ok with covid 19- doing a lot at house, missing travel. Still able to work.    Recommended follow up: No follow-ups on file. Future Appointments  Date Time Provider [place]  09/03/2021  9:00 AM [facility] 3 [facility]  10/22/2021  9:40 AM Yong Channel Brayton Mars, MD [facility]    No chief complaint on file.  Lab/Order associations:*** fasting No diagnosis found.  No orders of the defined types were placed in this encounter.   Return precautions advised.  Burnett Corrente

## 2021-09-03 ENCOUNTER — Ambulatory Visit (HOSPITAL_COMMUNITY)
Admission: RE | Admit: 2021-09-03 | Discharge: 2021-09-03 | Disposition: A | Discharge status: Home / Self Care | Payer: 59 | Source: Ambulatory Visit | Attending: Orthopedic Surgery | Admitting: Orthopedic Surgery

## 2021-09-03 ENCOUNTER — Other Ambulatory Visit: Payer: Self-pay

## 2021-09-03 DIAGNOSIS — M25562 Pain in left knee: Secondary | ICD-10-CM | POA: Insufficient documentation

## 2021-09-03 DIAGNOSIS — G8929 Other chronic pain: Secondary | ICD-10-CM

## 2021-09-03 DIAGNOSIS — M25561 Pain in right knee: Secondary | ICD-10-CM | POA: Insufficient documentation

## 2021-09-04 ENCOUNTER — Encounter: Payer: Self-pay | Admitting: Orthopedic Surgery

## 2021-09-08 ENCOUNTER — Encounter: Payer: Self-pay | Admitting: Orthopedic Surgery

## 2021-09-08 ENCOUNTER — Other Ambulatory Visit: Payer: Self-pay

## 2021-09-08 ENCOUNTER — Ambulatory Visit: Payer: 59 | Admitting: Orthopedic Surgery

## 2021-09-08 DIAGNOSIS — M62562 Muscle wasting and atrophy, not elsewhere classified, left lower leg: Secondary | ICD-10-CM

## 2021-09-08 DIAGNOSIS — E559 Vitamin D deficiency, unspecified: Secondary | ICD-10-CM | POA: Diagnosis not present

## 2021-09-08 MED ORDER — TRAMADOL HCL 50 MG PO TABS: 50.0000 mg | ORAL_TABLET | Freq: Two times a day (BID) | ORAL | 0 refills | Status: DC | PRN

## 2021-09-11 ENCOUNTER — Encounter: Payer: Self-pay | Admitting: Orthopedic Surgery

## 2021-09-11 NOTE — Progress Notes (Signed)
? ?Office Visit Note ?  ?Patient: Gabriel Hamilton           ?Date of Birth: [birth year]           ?MRN: [id number] ?Visit Date: 09/08/2021 ?Requested by: Marianna Payment, MD ?[address] ?[phone] ?[place],  [place] [address] ?PCP: Marianna Payment, MD ? ?Subjective: ?Chief Complaint  ?Patient presents with  ? Left Knee - Follow-up  ? ? ?HPI: [name] is a patient here to review MRI scan of his left knee.  Taking Tylenol as well as gabapentin for pain.  No recent injuries or falls.  He is also on vitamin D for vitamin D deficiency which is helping some.  He is done with that.  Reports some pain and tightness in the left lateral calf.  He works and walks around [employer] and that has been difficult.  Does have type 2 diabetes but his hemoglobin A1c was intact recently.  Review of the chart shows that that has not always been the case.  Does have neuropathy.  Saw neurology 1021 and they did diagnose him with peripheral neuropathy. ? ?MRI scan of the knee shows not much in the way of significant intra-articular knee pathology.  There is a small lipoma in the medial head of the gastroc.All systems reviewed are negative as they relate to the chief complaint within the history of present illness.  Patient denies  fevers or chills. ? ?             ?ROS: All systems reviewed are negative as they relate to the chief complaint within the history of present illness.  Patient denies  fevers or chills. ? ? ?Assessment & Plan: ?Visit Diagnoses:  ?1. Vitamin D deficiency   ?2. Atrophy of muscle of left lower leg   ? ? ?Plan: Impression is general muscular atrophy of both calfs the patient with neuropathy.  I think this is likely neuropathy but he is having very focal type pain in that left leg.  Could be far lateral disc herniation.  Need MRI lumbar spine to evaluate left lower extremity muscle atrophy as well as focal pain laterally.  Ultram also prescribed.  If that scan is negative this is really a metabolic problem which does not  have a structural orthopedic component. ? ?Follow-Up Instructions: Return for after MRI.  ? ?Orders:  ?Orders Placed This Encounter  ?Procedures  ? MR Lumbar Spine w/o contrast  ? ?Meds ordered this encounter  ?Medications  ? traMADol (ULTRAM) 50 MG tablet  ?  Sig: Take 1 tablet (50 mg total) by mouth every 12 (twelve) hours as needed.  ?  Dispense:  20 tablet  ?  Refill:  0  ? ? ? ? Procedures: ?No procedures performed ? ? ?Clinical Data: ?No additional findings. ? ?Objective: ?Vital Signs: There were no vitals taken for this visit. ? ?Physical Exam:  ? ?Constitutional: Patient appears well-developed ?HEENT:  ?Head: Normocephalic ?Eyes:EOM are normal ?Neck: Normal range of motion ?Cardiovascular: Normal rate ?Pulmonary/chest: Effort normal ?Neurologic: Patient is alert ?Skin: Skin is warm ?Psychiatric: Patient has normal mood and affect ? ? ?Ortho Exam: Ortho exam demonstrates good ankle dorsiflexion plantarflexion strength with no groin pain with internal/external rotation of either leg.  No nerve root tension signs.  Pedal pulses palpable.  Range of motion left knee is full.  Negative patellar apprehension.  No joint line tenderness. ? ?Specialty Comments:  ?No specialty comments available. ? ?Imaging: ?No results found. ? ? ?PMFS History: ?  Patient Active Problem List  ? Diagnosis Date Noted  ? Early satiety 01/29/2021  ? Bilateral knee pain 01/29/2021  ? Overweight 11/16/2018  ? 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 Type 2 diabetes mellitus with hyperglycemia, without long-term current use of insulin ([place]) 12/18/2015  ? Ingrown nail 07/24/2015  ? 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 Metatarsal deformity 05/08/2015  ? Equinus deformity of foot, acquired 05/08/2015  ? Onychomycosis 12/26/2014  ? Hyperlipidemia, unspecified 06/06/2014  ? Tachycardia 02/14/2014  ? Acne   ? Tobacco abuse 12/22/2011  ? Polycythemia 08/26/2011  ? HYPOGONADISM 11/21/2009  ? Essential hypertension 07/10/2009  ?  GERD 03/27/2008  ? HEART MURMUR 03/27/2008  ? ?Past Medical History:  ?Diagnosis Date  ? Acne   ? Diabetes mellitus   ? GERD (gastroesophageal reflux disease)   ? Heart murmur   ? Hypertension   ?  ?Family History  ?Problem Relation Age of Onset  ? Diabetes Father   ? Heart disease Father   ? Hyperlipidemia Father   ? Hypertension Father   ? Arthritis Mother   ? Cancer Mother   ?     optic nerve  ?  ?History reviewed. No pertinent surgical history. ?Social History  ? ?Occupational History  ? Not on file  ?Tobacco Use  ? Smoking status: Every Day  ?  Packs/day: 0.50  ?  Years: 22.00  ?  Pack years: 11.00  ?  Types: Cigarettes  ? Smokeless tobacco: Never  ?Vaping Use  ? Vaping Use: Never used  ?Substance and Sexual Activity  ? Alcohol use: Yes  ?  Alcohol/week: 14.0 standard drinks  ?  Types: 14 Standard drinks or equivalent per week  ? Drug use: No  ? Sexual activity: Yes  ? ? ? ? ? ?

## 2021-09-28 ENCOUNTER — Other Ambulatory Visit: Payer: Self-pay | Admitting: Orthopedic Surgery

## 2021-09-28 DIAGNOSIS — E559 Vitamin D deficiency, unspecified: Secondary | ICD-10-CM

## 2021-09-29 NOTE — Telephone Encounter (Signed)
Allegheny for one more rf only then dc ie 4 tabs

## 2021-10-07 ENCOUNTER — Telehealth: Payer: Self-pay | Admitting: Orthopedic Surgery

## 2021-10-07 NOTE — Telephone Encounter (Signed)
Called pt 1X and left a vm for pt to call and set MRI review appt with Dr Marlou Sa after 10/15/21 ?

## 2021-10-15 ENCOUNTER — Ambulatory Visit (HOSPITAL_COMMUNITY)
Admission: RE | Admit: 2021-10-15 | Discharge: 2021-10-15 | Disposition: A | Discharge status: Home / Self Care | Payer: 59 | Source: Ambulatory Visit | Attending: Orthopedic Surgery | Admitting: Orthopedic Surgery

## 2021-10-15 DIAGNOSIS — R531 Weakness: Secondary | ICD-10-CM | POA: Diagnosis not present

## 2021-10-15 DIAGNOSIS — M62562 Muscle wasting and atrophy, not elsewhere classified, left lower leg: Secondary | ICD-10-CM | POA: Insufficient documentation

## 2021-10-22 ENCOUNTER — Ambulatory Visit (INDEPENDENT_AMBULATORY_CARE_PROVIDER_SITE_OTHER): Payer: 59 | Admitting: Family Medicine

## 2021-10-22 ENCOUNTER — Encounter: Payer: Self-pay | Admitting: Family Medicine

## 2021-10-22 VITALS — BP 120/80 | HR 68 | Temp 98.0°F | Ht 68.0 in | Wt 167.6 lb

## 2021-10-22 DIAGNOSIS — E785 Hyperlipidemia, unspecified: Secondary | ICD-10-CM | POA: Diagnosis not present

## 2021-10-22 DIAGNOSIS — N5089 Other specified disorders of the male genital organs: Secondary | ICD-10-CM

## 2021-10-22 DIAGNOSIS — G629 Polyneuropathy, unspecified: Secondary | ICD-10-CM

## 2021-10-22 DIAGNOSIS — M62562 Muscle wasting and atrophy, not elsewhere classified, left lower leg: Secondary | ICD-10-CM

## 2021-10-22 DIAGNOSIS — Z720 Tobacco use: Secondary | ICD-10-CM

## 2021-10-22 DIAGNOSIS — I10 Essential (primary) hypertension: Secondary | ICD-10-CM

## 2021-10-22 DIAGNOSIS — E1165 Type 2 diabetes mellitus with hyperglycemia: Secondary | ICD-10-CM

## 2021-10-22 DIAGNOSIS — N50812 Left testicular pain: Secondary | ICD-10-CM

## 2021-10-22 DIAGNOSIS — Z79899 Other long term (current) drug therapy: Secondary | ICD-10-CM

## 2021-10-22 DIAGNOSIS — Z0000 Encounter for general adult medical examination without abnormal findings: Secondary | ICD-10-CM

## 2021-10-22 DIAGNOSIS — Z1159 Encounter for screening for other viral diseases: Secondary | ICD-10-CM

## 2021-10-22 DIAGNOSIS — Z125 Encounter for screening for malignant neoplasm of prostate: Secondary | ICD-10-CM | POA: Diagnosis not present

## 2021-10-22 LAB — CBC WITH DIFFERENTIAL/PLATELET
Basophils Absolute: 0 10*3/uL (ref 0.0–0.1)
Basophils Relative: 0.8 % (ref 0.0–3.0)
Eosinophils Absolute: 0.1 10*3/uL (ref 0.0–0.7)
Eosinophils Relative: 2.3 % (ref 0.0–5.0)
HCT: 49.4 % (ref 39.0–52.0)
Hemoglobin: 16.3 g/dL (ref 13.0–17.0)
Lymphocytes Relative: 31.6 % (ref 12.0–46.0)
Lymphs Abs: 1.9 10*3/uL (ref 0.7–4.0)
MCHC: 33.1 g/dL (ref 30.0–36.0)
MCV: 96.6 fl (ref 78.0–100.0)
Monocytes Absolute: 0.5 10*3/uL (ref 0.1–1.0)
Monocytes Relative: 8.5 % (ref 3.0–12.0)
Neutro Abs: 3.4 10*3/uL (ref 1.4–7.7)
Neutrophils Relative %: 56.8 % (ref 43.0–77.0)
Platelets: 276 10*3/uL (ref 150.0–400.0)
RBC: 5.11 Mil/uL (ref 4.22–5.81)
RDW: 15 % (ref 11.5–15.5)
WBC: 6.1 10*3/uL (ref 4.0–10.5)

## 2021-10-22 LAB — PSA: PSA: 0.78 ng/mL (ref 0.10–4.00)

## 2021-10-22 LAB — LIPID PANEL
Cholesterol: 145 mg/dL (ref 0–200)
HDL: 73.6 mg/dL (ref >39.00)
LDL Cholesterol: 51 mg/dL (ref 0–99)
NonHDL: 71.29
Total CHOL/HDL Ratio: 2
Triglycerides: 99 mg/dL (ref 0.0–149.0)
VLDL: 19.8 mg/dL (ref 0.0–40.0)

## 2021-10-22 LAB — COMPREHENSIVE METABOLIC PANEL
ALT: 8 U/L (ref 0–53)
AST: 16 U/L (ref 0–37)
Albumin: 3.8 g/dL (ref 3.5–5.2)
Alkaline Phosphatase: 59 U/L (ref 39–117)
BUN: 6 mg/dL (ref 6–23)
CO2: 32 mEq/L (ref 19–32)
Calcium: 9.3 mg/dL (ref 8.4–10.5)
Chloride: 99 mEq/L (ref 96–112)
Creatinine, Ser: 0.71 mg/dL (ref 0.40–1.50)
GFR: 105.79 mL/min (ref >60.00)
Glucose, Bld: 87 mg/dL (ref 70–99)
Potassium: 4.4 mEq/L (ref 3.5–5.1)
Sodium: 141 mEq/L (ref 135–145)
Total Bilirubin: 0.7 mg/dL (ref 0.2–1.2)
Total Protein: 6.5 g/dL (ref 6.0–8.3)

## 2021-10-22 LAB — POC URINALSYSI DIPSTICK (AUTOMATED)
Bilirubin, UA: NEGATIVE
Blood, UA: NEGATIVE
Glucose, UA: POSITIVE — AB
Ketones, UA: NEGATIVE
Leukocytes, UA: NEGATIVE
Nitrite, UA: NEGATIVE
Protein, UA: NEGATIVE
Spec Grav, UA: 1.015 (ref 1.010–1.025)
Urobilinogen, UA: 0.2 E.U./dL
pH, UA: 5.5 (ref 5.0–8.0)

## 2021-10-22 LAB — TSH: TSH: 0.57 u[IU]/mL (ref 0.35–5.50)

## 2021-10-22 LAB — HEMOGLOBIN A1C: Hgb A1c MFr Bld: 5.6 % (ref 4.6–6.5)

## 2021-10-22 LAB — VITAMIN B12: Vitamin B-12: 770 pg/mL (ref 211–911)

## 2021-10-22 MED ORDER — EMPAGLIFLOZIN 10 MG PO TABS: 10.0000 mg | ORAL_TABLET | Freq: Every day | ORAL | 11 refills | Status: DC

## 2021-10-22 NOTE — Patient Instructions (Addendum)
Make sure to have a copy of Diabetic Eye Exam sent to [place] next month when you go. ? ?Sign release of information at the check out desk for colonoscopy from [facility]  ? ?We will call you within two weeks about your referral to lung cancer screening program and neurology and US scrotum (check lump on testicle left). If you do not hear within 2 weeks, give [place] a call.   ? ?Please stop by lab before you go ?If you have mychart- we will send your results within 3 business days of [place] receiving them.  ?If you do not have mychart- we will call you about results within 5 business days of [place] receiving them.  ?*please also note that you will see labs on mychart as soon as they post. I will later go in and write notes on them- will say "notes from Dr. Yong Channel"  ? ?Recommended follow up: Return in about 6 months (around 04/24/2022) for followup or sooner if needed.Schedule b4 you leave.  ?

## 2021-10-23 ENCOUNTER — Other Ambulatory Visit: Payer: Self-pay

## 2021-10-23 ENCOUNTER — Encounter: Payer: Self-pay | Admitting: Family Medicine

## 2021-10-23 LAB — HEPATITIS C ANTIBODY
Hepatitis C Ab: NONREACTIVE
SIGNAL TO CUT-OFF: 0.14 (ref <1.00)

## 2021-10-23 MED ORDER — ROSUVASTATIN CALCIUM 5 MG PO TABS: 5.0000 mg | ORAL_TABLET | ORAL | 3 refills | Status: DC

## 2021-10-29 ENCOUNTER — Encounter: Payer: Self-pay | Admitting: Orthopedic Surgery

## 2021-10-29 ENCOUNTER — Ambulatory Visit (INDEPENDENT_AMBULATORY_CARE_PROVIDER_SITE_OTHER): Payer: 59 | Admitting: Orthopedic Surgery

## 2021-10-29 DIAGNOSIS — E559 Vitamin D deficiency, unspecified: Secondary | ICD-10-CM | POA: Diagnosis not present

## 2021-10-29 DIAGNOSIS — M62562 Muscle wasting and atrophy, not elsewhere classified, left lower leg: Secondary | ICD-10-CM

## 2021-10-29 NOTE — Progress Notes (Signed)
? ?Office Visit Note ?  ?Patient: Gabriel Hamilton           ?Date of Birth: 1971/06/04           ?MRN: [id number] ?Visit Date: 10/29/2021 ?Requested by: Gabriel Payment, MD ?[address] ?[phone] ?[place],  [place] [address] ?PCP: Gabriel Olp, MD ? ?Subjective: ?Chief Complaint  ?Patient presents with  ? Other  ?  scan review  ? ? ?HPI: Gabriel Hamilton is a 51 year old patient here to review MRI scan of his lumbar spine.  Patient is having lower extremity symptoms which have not yet been explained by extensive orthopedic work-up.  His hemoglobin A1c is under very good control.  Vitamin D level has likely normalized since supplementation.  He has good and bad days with his lower extremities.  Knee MRI scan unremarkable.  Lumbar spine MRI is reviewed with the patient.  In general no compressive lesions are present.  No explanation for the patient's left leg atrophy or lower extremity pain from a structural standpoint. ?             ?ROS: All systems reviewed are negative as they relate to the chief complaint within the history of present illness.  Patient denies  fevers or chills. ? ? ?Assessment & Plan: ?Visit Diagnoses:  ?1. Vitamin D deficiency   ?2. Atrophy of muscle of left lower leg   ? ? ?Plan: Impression is no structural surgical problem in the knee or back.  Unclear etiology of his symptoms at this time.  Work-up with neurology indicated for metabolic factors.  Could also be related to his diabetes even though it is under good control.  He will follow-up with [place] as needed. ? ?Follow-Up Instructions: No follow-ups on file.  ? ?Orders:  ?No orders of the defined types were placed in this encounter. ? ?No orders of the defined types were placed in this encounter. ? ? ? ? Procedures: ?No procedures performed ? ? ?Clinical Data: ?No additional findings. ? ?Objective: ?Vital Signs: There were no vitals taken for this visit. ? ?Physical Exam:  ? ?Constitutional: Patient appears well-developed ?HEENT:  ?Head:  Normocephalic ?Eyes:EOM are normal ?Neck: Normal range of motion ?Cardiovascular: Normal rate ?Pulmonary/chest: Effort normal ?Neurologic: Patient is alert ?Skin: Skin is warm ?Psychiatric: Patient has normal mood and affect ? ? ?Ortho Exam: Ortho exam demonstrates full active and passive range of motion of both knees.  Gait is normal.  Rest of his examination is unchanged from prior visits. ? ?Specialty Comments:  ?No specialty comments available. ? ?Imaging: ?No results found. ? ? ?PMFS History: ?Patient Active Problem List  ? Diagnosis Date Noted  ? Early satiety 01/29/2021  ? Bilateral knee pain 01/29/2021  ? Overweight 11/16/2018  ? 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 Type 2 diabetes mellitus with hyperglycemia, without long-term current use of insulin ([place]) 12/18/2015  ? Ingrown nail 07/24/2015  ? 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 Metatarsal deformity 05/08/2015  ? Equinus deformity of foot, acquired 05/08/2015  ? Onychomycosis 12/26/2014  ? Hyperlipidemia, unspecified 06/06/2014  ? Tachycardia 02/14/2014  ? Acne   ? Tobacco abuse 12/22/2011  ? Polycythemia 08/26/2011  ? HYPOGONADISM 11/21/2009  ? Essential hypertension 07/10/2009  ? GERD 03/27/2008  ? HEART MURMUR 03/27/2008  ? ?Past Medical History:  ?Diagnosis Date  ? Acne   ? Diabetes mellitus   ? GERD (gastroesophageal reflux disease)   ? Heart murmur   ? Hypertension   ?  ?  Family History  ?Problem Relation Age of Onset  ? Diabetes Father   ? Heart disease Father   ? Hyperlipidemia Father   ? Hypertension Father   ? Arthritis Mother   ? Cancer Mother   ?     optic nerve  ?  ?History reviewed. No pertinent surgical history. ?Social History  ? ?Occupational History  ? Not on file  ?Tobacco Use  ? Smoking status: Every Day  ?  Packs/day: 1.00  ?  Years: 28.00  ?  Pack years: 28.00  ?  Types: Cigarettes  ?  Start date: 2  ? Smokeless tobacco: Never  ?Vaping Use  ? Vaping Use: Never used  ?Substance and Sexual Activity  ?  Alcohol use: Yes  ?  Alcohol/week: 14.0 standard drinks  ?  Types: 14 Standard drinks or equivalent per week  ? Drug use: No  ? Sexual activity: Yes  ? ? ? ? ? ?

## 2021-11-28 ENCOUNTER — Other Ambulatory Visit: Payer: Self-pay | Admitting: Orthopedic Surgery

## 2021-11-28 DIAGNOSIS — E559 Vitamin D deficiency, unspecified: Secondary | ICD-10-CM

## 2021-11-28 NOTE — Telephone Encounter (Signed)
Just needs to take 1000-2000 units daily OTC

## 2022-01-06 ENCOUNTER — Other Ambulatory Visit: Payer: Self-pay

## 2022-01-06 DIAGNOSIS — N5089 Other specified disorders of the male genital organs: Secondary | ICD-10-CM

## 2022-02-20 ENCOUNTER — Telehealth: Payer: Self-pay | Admitting: Family Medicine

## 2022-02-20 MED ORDER — GABAPENTIN 300 MG PO CAPS: 300.0000 mg | ORAL_CAPSULE | Freq: Every day | ORAL | 5 refills | Status: DC

## 2022-02-20 NOTE — Telephone Encounter (Signed)
   LAST APPOINTMENT DATE:  10/22/21 CPE   NEXT APPOINTMENT DATE: 04/22/22 OV with PCP   MEDICATION: gabapentin (NEURONTIN) 300 MG capsule [[id number]]   Is the patient out of medication?  Yes, first missed dose was 02/12/22  PH[facility]ACY: [id number] [place] [place]  [address], [place] [address]  Phone:  [phone]  Fax:  [phone]

## 2022-02-20 NOTE — Telephone Encounter (Signed)
Rx refilled.

## 2022-02-26 ENCOUNTER — Ambulatory Visit
Admission: RE | Admit: 2022-02-26 | Discharge: 2022-02-26 | Disposition: A | Discharge status: Home / Self Care | Payer: Commercial Managed Care - HMO | Source: Ambulatory Visit | Attending: Family Medicine | Admitting: Family Medicine

## 2022-02-26 DIAGNOSIS — N5089 Other specified disorders of the male genital organs: Secondary | ICD-10-CM

## 2022-03-24 ENCOUNTER — Telehealth: Payer: Self-pay | Admitting: Family Medicine

## 2022-03-24 DIAGNOSIS — K219 Gastro-esophageal reflux disease without esophagitis: Secondary | ICD-10-CM

## 2022-03-24 NOTE — Telephone Encounter (Signed)
GERD S:Medication:  on prilosec '40mg'$ - failed '20mg'$  (decreased appetite on this dose). Does take b12 -History of early satiety at times but improved with higher dose - Some consideration of gastroparesis related to diabetes - No dysphagia A/P: controlled- early satiety better and has had EGD he reports. B12 was ok in past but will update   I dont see where pt should be taking '40mg'$  twice a day.

## 2022-03-24 NOTE — Telephone Encounter (Signed)
I agree Gabriel Hamilton should be on this once a day-if requiring twice a day lets place a referral to GI under GERD

## 2022-03-24 NOTE — Telephone Encounter (Signed)
Patient states there was a dosage increase for the following medication to taking 2 tablets per day, however, Patient states insurance requires Pharmacy be notified as to why Patient needs to take 2 tablets per day (1 in morning and 1 at night).  Patient states he picked up the last RX for the medication listed below on 03/14/22 which would only last 15 days if Patient takes 2 tablets per day, therefore, Patient is taking 1 tablet per day so that Patient does not run out of medication in 15 days.  Medication: omeprazole (PRILOSEC) 40 MG capsule   [facility] [place] [place] Phone:  [phone]  Fax:  [phone]

## 2022-03-26 NOTE — Telephone Encounter (Signed)
Called mobile number and phone just rang, vm never came on.

## 2022-03-26 NOTE — Telephone Encounter (Signed)
Yes thanks-can refill to get him to appointment

## 2022-03-26 NOTE — Telephone Encounter (Signed)
Called and spoke with pt and he states that the increased dose was done by Dr. 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 I have placed a referral for him, ok to send in refill to get him to appt with GI?

## 2022-03-26 NOTE — Addendum Note (Signed)
Addended by: Clyde [place] A on: 03/26/2022 01:37 PM   Modules accepted: Orders

## 2022-03-27 MED ORDER — OMEPRAZOLE 40 MG PO CPDR: 40.0000 mg | DELAYED_RELEASE_CAPSULE | Freq: Every day | ORAL | 2 refills | Status: DC

## 2022-03-27 NOTE — Addendum Note (Signed)
Addended by: Clyde [place] A on: 03/27/2022 08:32 AM   Modules accepted: Orders
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
## 2022-03-27 NOTE — Telephone Encounter (Signed)
Rx sent to pharmacy   

## 2022-04-01 ENCOUNTER — Encounter: Payer: Self-pay | Admitting: Neurology

## 2022-04-01 ENCOUNTER — Other Ambulatory Visit (INDEPENDENT_AMBULATORY_CARE_PROVIDER_SITE_OTHER): Payer: Commercial Managed Care - HMO

## 2022-04-01 ENCOUNTER — Ambulatory Visit: Payer: Commercial Managed Care - HMO | Admitting: Neurology

## 2022-04-01 VITALS — BP 113/80 | HR 101 | Ht 68.0 in | Wt 166.4 lb

## 2022-04-01 DIAGNOSIS — E1142 Type 2 diabetes mellitus with diabetic polyneuropathy: Secondary | ICD-10-CM | POA: Diagnosis not present

## 2022-04-01 DIAGNOSIS — G621 Alcoholic polyneuropathy: Secondary | ICD-10-CM | POA: Diagnosis not present

## 2022-04-01 DIAGNOSIS — R2689 Other abnormalities of gait and mobility: Secondary | ICD-10-CM | POA: Diagnosis not present

## 2022-04-01 LAB — FOLATE: Folate: 7.9 ng/mL (ref >5.9)

## 2022-04-01 MED ORDER — GABAPENTIN 300 MG PO CAPS: ORAL_CAPSULE | ORAL | 5 refills | Status: DC

## 2022-04-01 NOTE — Patient Instructions (Addendum)
Increase gabapentin to '300mg'$  twice daily x 1 week, then three times daily  Nerve testing of the left > right leg  We will refer you for balance therapy  Check labs  Return to clinic in 2 months  [place] (EMG/NCS) INSTRUCTIONS  How to Prepare The neurologist conducting the EMG will need to know if you have certain medical conditions. Tell the neurologist and other EMG lab personnel if you: Have a pacemaker or any other electrical medical device Take blood-thinning medications Have hemophilia, a blood-clotting disorder that causes prolonged bleeding Bathing Take a shower or bath shortly before your exam in order to remove oils from your skin. Don't apply lotions or creams before the exam.  What to Expect You'll likely be asked to change into a hospital gown for the procedure and lie down on an examination table. The following explanations can help you understand what will happen during the exam.  Electrodes. The neurologist or a technician places surface electrodes at various locations on your skin depending on where you're experiencing symptoms. Or the neurologist may insert needle electrodes at different sites depending on your symptoms.  Sensations. The electrodes will at times transmit a tiny electrical current that you may feel as a twinge or spasm. The needle electrode may cause discomfort or pain that usually ends shortly after the needle is removed. If you are concerned about discomfort or pain, you may want to talk to the neurologist about taking a short break during the exam.  Instructions. During the needle EMG, the neurologist will assess whether there is any spontaneous electrical activity when the muscle is at rest - activity that isn't present in healthy muscle tissue - and the degree of activity when you slightly contract the muscle.  He or she will give you instructions on resting and contracting a muscle at appropriate times. Depending on  what muscles and nerves the neurologist is examining, he or she may ask you to change positions during the exam.  After your EMG You may experience some temporary, minor bruising where the needle electrode was inserted into your muscle. This bruising should fade within several days. If it persists, contact your primary care doctor.

## 2022-04-06 LAB — VITAMIN B1: Vitamin B1 (Thiamine): 8 nmol/L (ref 8–30)

## 2022-04-16 ENCOUNTER — Ambulatory Visit: Payer: Commercial Managed Care - HMO | Admitting: Neurology

## 2022-04-16 DIAGNOSIS — E1142 Type 2 diabetes mellitus with diabetic polyneuropathy: Secondary | ICD-10-CM

## 2022-04-16 DIAGNOSIS — G621 Alcoholic polyneuropathy: Secondary | ICD-10-CM | POA: Diagnosis not present

## 2022-04-17 NOTE — Procedures (Signed)
[facility]  [place] [place], [place]  [place], [place] [address] Tel: [phone] Fax: [phone] Test Date:  04/16/2022  Patient: Gabriel Hamilton DOB: May 04, 1971 Physician: Narda Amber, DO  Sex: Male Height: '5\' 8"'$  Ref Phys: Narda Amber, DO  ID#: [id number]   Technician:    History: This is a 51 year old man referred for evaluation of left leg weakness, concerning for lumbosacral plexopathy.  NCV & EMG Findings: Extensive electrodiagnostic testing of the left lower extremity and additional studies of the right shows:  Bilateral sural, superficial peroneal, and saphenous sensory responses are absent. Bilateral peroneal (EDB) and tibial motor responses show reduced amplitudes slowed conduction velocity (18 - 35 m/s).  Additionally, there is evidence of conduction block and temporal dispersion affecting the left tibial motor nerve.  Left peroneal motor responses within normal limits.  Right peroneal motor response shows slowed conduction velocity across the fibular head (35 m/s). Bilateral tibial H reflex studies are prolonged. There is evidence of a patchy neurogenic process with chronic motor axonal loss changes affecting the left iliopsoas, abductor longus, rectus femoris, gastrocnemius, anterior tibialis, and extensor digitorum longus muscles.  In the right lower extremity, chronic motor axonal loss changes are seen affecting the muscles below the knee.  Fibrillation potentials are not seen in the muscles.  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 A left lumbosacral plexopathy seems unlikely in the setting of associated demyelinating findings, however this cannot be completely excluded in the setting of overlapping severe neuropathy.  Correlate clinically.   ___________________________ Narda Amber, DO    Nerve Conduction Studies   Stim Site NR Peak (ms) Norm Peak (ms) O-P Amp (V) Norm O-P Amp  Left Saphenous Anti Sensory (Ant Med Mall)  32 C  14cm *NR  <4.4  >4  Right Saphenous Anti Sensory (Ant Med Mall)  32 C  14cm *NR  <4.4  >4  Left Sup Peroneal Anti Sensory (Ant Lat Mall)  32 C  12 cm *NR  <4.6  >4  Right Sup Peroneal Anti Sensory (Ant Lat Mall)  32 C  12 cm *NR  <4.6  >4  Left Sural Anti Sensory (Lat Mall)  32 C  Calf *NR  <4.6  >4  Right Sural Anti Sensory (Lat Mall)  32 C  Calf *NR  <4.6  >4     Stim Site NR Onset (ms) Norm Onset (ms) O-P Amp (mV) Norm O-P Amp Site1 Site2 Delta-0 (ms) Dist (cm) Vel (m/s) Norm Vel (m/s)  Left Peroneal Motor (Ext Dig Brev)  32 C  Ankle    5.9 <6.0 *1.2 >2.5 B Fib Ankle 11.6 35.0 *30 >40  B Fib    17.5  0.9  Poplt B Fib 3.1 8.0 *26 >40  Poplt    20.6  0.8         Right Peroneal Motor (Ext Dig Brev)  32 C  Ankle    4.2 <6.0 *0.9 >2.5 B Fib Ankle 10.8 38.0 *35 >40  B Fib    15.0  0.7  Poplt B Fib 3.9 7.0 *18 >40  Poplt    18.9  0.6         Left Peroneal TA Motor (Tib Ant)  32 C  Fib Head    3.5 <4.5 3.9 >3 Poplit Fib Head 1.3 7.0 54 >40  Poplit    4.8 <5.7 3.7  Right Peroneal TA Motor (Tib Ant)  32 C  Fib Head    3.7 <4.5 3.7 >3 Poplit Fib Head 2.0 7.0 *35 >40  Poplit    5.7 <5.7 3.5         Left Tibial Motor (Abd Hall Brev)  32 C  Ankle    *6.3 <6.0 *1.5 >4 Knee Ankle 14.6 41.0 *28 >40  Knee    20.9  0.3         Right Tibial Motor (Abd Hall Brev)  32 C  Ankle    5.0 <6.0 *1.0 >4 Knee Ankle 18.0 42.0 *23 >40  Knee    23.0  0.6          Electromyography   Side Muscle Ins.Act Fibs Fasc Recrt Amp Dur Poly Activation Comment  Left AntTibialis Nml Nml Nml *1- *1+ *1+ *1+ Nml N/A  Left Gastroc Nml Nml Nml *1- *1+ *1+ *1+ Nml N/A  Left Flex Dig Long Nml Nml Nml *2- *1+ *1+ *1+ Nml N/A  Right BicepsFemS Nml Nml Nml Nml Nml Nml Nml Nml N/A  Left RectFemoris Nml Nml Nml *2- *1+ *1+ *1+ Nml N/A  Left  GluteusMed Nml Nml Nml Nml Nml Nml Nml Nml N/A  Left Iliopsoas Nml Nml Nml *2- *1+ *1+ *1+ Nml N/A  Left AdductorLong Nml Nml Nml *2- *1+ *1+ *1+ Nml N/A  Left Lumbo Parasp Low Nml Nml Nml Nml Nml Nml Nml Nml N/A  Right AntTibialis Nml Nml Nml *1- *1+ *1+ *1+ Nml N/A  Right Gastroc Nml Nml Nml *1- *1+ *1+ *1+ Nml N/A  Right Flex Dig Long Nml Nml Nml *1- *1+ *1+ *1+ Nml N/A  Right RectFemoris Nml Nml Nml Nml Nml Nml Nml Nml N/A  Left BicepsFemS Nml Nml Nml Nml Nml Nml Nml Nml N/A     Waveforms:

## 2022-04-20 ENCOUNTER — Telehealth: Payer: Self-pay | Admitting: Neurology

## 2022-04-20 DIAGNOSIS — G610 Guillain-Barre syndrome: Secondary | ICD-10-CM

## 2022-04-20 DIAGNOSIS — D8989 Other specified disorders involving the immune mechanism, not elsewhere classified: Secondary | ICD-10-CM

## 2022-04-20 NOTE — Telephone Encounter (Signed)
Pt called in stating he was returning a call. He thinks it may be results

## 2022-04-20 NOTE — Telephone Encounter (Signed)
Called patient and discussed results of EMG which favors a polyradiculoneuropathy.  I recommend further testing with lumbar puncture to evaluate for immune-mediated neuropathy.

## 2022-04-21 NOTE — Telephone Encounter (Signed)
Order has been placed and faxed to [facility].

## 2022-04-22 ENCOUNTER — Ambulatory Visit: Payer: Commercial Managed Care - HMO | Admitting: Family

## 2022-04-22 ENCOUNTER — Ambulatory Visit (INDEPENDENT_AMBULATORY_CARE_PROVIDER_SITE_OTHER): Payer: Commercial Managed Care - HMO | Admitting: Family Medicine

## 2022-04-22 ENCOUNTER — Encounter: Payer: Self-pay | Admitting: Family

## 2022-04-22 ENCOUNTER — Encounter: Payer: Self-pay | Admitting: Family Medicine

## 2022-04-22 VITALS — BP 108/72 | HR 86 | Temp 97.9°F | Ht 68.0 in | Wt 170.4 lb

## 2022-04-22 DIAGNOSIS — F17200 Nicotine dependence, unspecified, uncomplicated: Secondary | ICD-10-CM

## 2022-04-22 DIAGNOSIS — E559 Vitamin D deficiency, unspecified: Secondary | ICD-10-CM | POA: Diagnosis not present

## 2022-04-22 DIAGNOSIS — E1165 Type 2 diabetes mellitus with hyperglycemia: Secondary | ICD-10-CM

## 2022-04-22 DIAGNOSIS — Z720 Tobacco use: Secondary | ICD-10-CM | POA: Diagnosis not present

## 2022-04-22 DIAGNOSIS — E889 Metabolic disorder, unspecified: Secondary | ICD-10-CM

## 2022-04-22 DIAGNOSIS — I10 Essential (primary) hypertension: Secondary | ICD-10-CM

## 2022-04-22 DIAGNOSIS — G63 Polyneuropathy in diseases classified elsewhere: Secondary | ICD-10-CM | POA: Diagnosis not present

## 2022-04-22 DIAGNOSIS — E785 Hyperlipidemia, unspecified: Secondary | ICD-10-CM

## 2022-04-22 DIAGNOSIS — B351 Tinea unguium: Secondary | ICD-10-CM

## 2022-04-22 LAB — CBC WITH DIFFERENTIAL/PLATELET
Basophils Absolute: 0.1 10*3/uL (ref 0.0–0.1)
Basophils Relative: 1 % (ref 0.0–3.0)
Eosinophils Absolute: 0.3 10*3/uL (ref 0.0–0.7)
Eosinophils Relative: 5.2 % — ABNORMAL HIGH (ref 0.0–5.0)
HCT: 49.5 % (ref 39.0–52.0)
Hemoglobin: 16.2 g/dL (ref 13.0–17.0)
Lymphocytes Relative: 27.1 % (ref 12.0–46.0)
Lymphs Abs: 1.8 10*3/uL (ref 0.7–4.0)
MCHC: 32.7 g/dL (ref 30.0–36.0)
MCV: 98.7 fl (ref 78.0–100.0)
Monocytes Absolute: 0.7 10*3/uL (ref 0.1–1.0)
Monocytes Relative: 10 % (ref 3.0–12.0)
Neutro Abs: 3.8 10*3/uL (ref 1.4–7.7)
Neutrophils Relative %: 56.7 % (ref 43.0–77.0)
Platelets: 258 10*3/uL (ref 150.0–400.0)
RBC: 5.02 Mil/uL (ref 4.22–5.81)
RDW: 16.8 % — ABNORMAL HIGH (ref 11.5–15.5)
WBC: 6.7 10*3/uL (ref 4.0–10.5)

## 2022-04-22 LAB — COMPREHENSIVE METABOLIC PANEL
ALT: 12 U/L (ref 0–53)
AST: 22 U/L (ref 0–37)
Albumin: 3.6 g/dL (ref 3.5–5.2)
Alkaline Phosphatase: 66 U/L (ref 39–117)
BUN: 7 mg/dL (ref 6–23)
CO2: 33 mEq/L — ABNORMAL HIGH (ref 19–32)
Calcium: 9.5 mg/dL (ref 8.4–10.5)
Chloride: 99 mEq/L (ref 96–112)
Creatinine, Ser: 0.76 mg/dL (ref 0.40–1.50)
GFR: 103.28 mL/min (ref >60.00)
Glucose, Bld: 234 mg/dL — ABNORMAL HIGH (ref 70–99)
Potassium: 5.3 mEq/L — ABNORMAL HIGH (ref 3.5–5.1)
Sodium: 140 mEq/L (ref 135–145)
Total Bilirubin: 0.5 mg/dL (ref 0.2–1.2)
Total Protein: 6.6 g/dL (ref 6.0–8.3)

## 2022-04-22 LAB — HEMOGLOBIN A1C: Hgb A1c MFr Bld: 6.4 % (ref 4.6–6.5)

## 2022-04-22 LAB — MICROALBUMIN / CREATININE URINE RATIO
Creatinine,U: 168.5 mg/dL
Microalb Creat Ratio: 1.1 mg/g (ref 0.0–30.0)
Microalb, Ur: 1.9 mg/dL (ref 0.0–1.9)

## 2022-04-22 LAB — VITAMIN D 25 HYDROXY (VIT D DEFICIENCY, FRACTURES): VITD: 31.01 ng/mL (ref 30.00–100.00)

## 2022-04-22 LAB — TSH: TSH: 1 u[IU]/mL (ref 0.35–5.50)

## 2022-04-22 MED ORDER — TRAMADOL HCL 50 MG PO TABS: 50.0000 mg | ORAL_TABLET | Freq: Two times a day (BID) | ORAL | 0 refills | Status: DC | PRN

## 2022-04-22 NOTE — Progress Notes (Signed)
Office Visit Note   Patient: Gabriel Hamilton           Date of Birth: [birth year]           MRN: [id number] Visit Date: 04/22/2022              Requested by: Marin Olp, MD [place],  [place] [address] PCP: Marin Olp, MD  Chief Complaint  Patient presents with   Right Foot - Wound Check   Left Foot - Wound Check      HPI: The patient is a 51 year old gentleman who presents today for bilateral foot evaluation he has peripheral neuropathy and is concerned that he is unable to safely trim his own nails he does have fungal burden.  He is also developed some callused ulcerations beneath both feet  He has tried topical antifungal, states was not very consistent with it  Assessment & Plan: Visit Diagnoses: No diagnosis found.  Plan: Nails were trimmed x10 as patient is unable to safely trim his own nails.  Tolerated well.  He will follow-up in the office on an as-needed basis.  Follow-Up Instructions: Return in about 3 months (around 07/23/2022).   Ortho Exam  Patient is alert, oriented, no adenopathy, well-dressed, normal affect, normal respiratory effort. On examination of bilateral feet pes planus.  He does have thickened and discolored onychomycotic nails x10 these were trimmed x10.  Patient tolerated well.  There is thickened callus beneath the lateral column this is superficial he states the he feels confident that he can continue debriding this with a pumice stone  There is no ulcer no impending ulceration  Imaging: No results found. No images are attached to the encounter.  Labs: Lab Results  Component Value Date   HGBA1C 5.6 10/22/2021   HGBA1C 5.3 01/29/2021   HGBA1C 6.1 (A) 03/22/2019   ESRSEDRATE 28 (H) 08/06/2021   CRP 2.6 08/06/2021   LABURIC 4.9 08/06/2021   GRAMSTAIN No WBC Seen 11/14/2014   GRAMSTAIN No Squamous Epithelial Cells Seen 11/14/2014   GRAMSTAIN No Organisms Seen 11/14/2014   LABORGA Multiple Organisms  Present,None Predominant 11/14/2014     Lab Results  Component Value Date   ALBUMIN 3.8 10/22/2021   ALBUMIN 4.0 08/15/2020   ALBUMIN 3.8 05/05/2020    No results found for: "MG" Lab Results  Component Value Date   VD25OH 14 (L) 08/06/2021    No results found for: "PREALBUMIN"    Latest Ref Rng & Units 10/22/2021   11:10 AM 08/06/2021   10:13 AM 08/15/2020    9:34 AM  CBC EXTENDED  WBC 4.0 - 10.5 K/uL 6.1  5.7  6.5   RBC 4.22 - 5.81 Mil/uL 5.11  6.05  5.89   Hemoglobin 13.0 - 17.0 g/dL 16.3  19.9  18.5   HCT 39.0 - 52.0 % 49.4  58.1  56.2   Platelets 150.0 - 400.0 K/uL 276.0  326  223   NEUT# 1.4 - 7.7 K/uL 3.4  2,770    Lymph# 0.7 - 4.0 K/uL 1.9  2,326       There is no height or weight on file to calculate BMI.  Orders:  No orders of the defined types were placed in this encounter.  No orders of the defined types were placed in this encounter.    Procedures: No procedures performed  Clinical Data: No additional findings.  ROS:  All other systems negative, except as noted in the HPI. Review of Systems  Objective: Vital Signs: There were no vitals taken for this visit.  Specialty Comments:  No specialty comments available.  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   Type 2 diabetes mellitus with hyperglycemia, without long-term current use of insulin ([place]) 12/18/2015   Ingrown nail 07/24/2015   Pronation deformity of both feet 05/22/2015   Metatarsal deformity 05/08/2015   Equinus deformity of foot, acquired 05/08/2015   Onychomycosis 12/26/2014   Hyperlipidemia, unspecified 06/06/2014   Tachycardia 02/14/2014   Acne    Tobacco abuse 12/22/2011   Polycythemia 08/26/2011   HYPOGONADISM 11/21/2009   Essential hypertension 07/10/2009   GERD 03/27/2008    HEART MURMUR 03/27/2008   Past Medical History:  Diagnosis Date   Acne    Diabetes mellitus    type 2   GERD (gastroesophageal reflux disease)    Heart murmur    Hypertension     Family History  Problem Relation Age of Onset   Arthritis Mother    Cancer Mother        optic nerve   Stroke Father    Diabetes Father    Heart disease Father    Hyperlipidemia Father    Hypertension Father     Past Surgical History:  Procedure Laterality Date   CATARACT EXTRACTION Bilateral    in 2023   Social History   Occupational History   Not on file  Tobacco Use   Smoking status: Every Day    Packs/day: 1.00    Years: 28.00    Total pack years: 28.00    Types: Cigarettes    Start date: 1995   Smokeless tobacco: Never  Vaping Use   Vaping Use: Never used  Substance and Sexual Activity   Alcohol use: Yes    Alcohol/week: 14.0 standard drinks of alcohol    Types: 14 Standard drinks or equivalent per week   Drug use: No   Sexual activity: Yes

## 2022-04-22 NOTE — Patient Instructions (Addendum)
Have Diabetic Eye Exam Scheduled.  Schedule podiatry follow up  We will call you within two weeks about your referral to lung cancer screening program. If you do not hear within 2 weeks, give [place] a call.    Sign release of information at the check out desk for last endoscopy from [facility] from 2022 and colonoscopy from 2021 and any pathology related to either  Needs bottle of water  Please stop by lab before you go If you have mychart- we will send your results within 3 business days of [place] receiving them.  If you do not have mychart- we will call you about results within 5 business days of [place] receiving them.  *please also note that you will see labs on mychart as soon as they post. I will later go in and write notes on them- will say "notes from Dr. Yong Channel"    Recommended follow up: Return in about 6 months (around 10/24/2022) for physical or sooner if needed.Schedule b4 you leave.

## 2022-04-22 NOTE — Progress Notes (Signed)
Phone [phone] In person visit   Subjective:   Gabriel Hamilton is a 51 y.o. year old very pleasant male patient who presents for/with See problem oriented charting Chief Complaint  Patient presents with   Follow-up   Diabetes   Hyperlipidemia   Fatigue    Pt c/o fatigue even thought taking vit b12   Callouses    Pt c/o callouses on feet.   Past Medical History-  Patient Active Problem List   Diagnosis Date Noted   Type 2 diabetes mellitus with hyperglycemia, without long-term current use of insulin ([place]) 12/18/2015    Priority: High   Tobacco abuse 12/22/2011    Priority: High   Vitamin D deficiency 04/22/2022    Priority: Medium    Hyperlipidemia, unspecified 06/06/2014    Priority: Medium    Polycythemia 08/26/2011    Priority: Medium    Essential hypertension 07/10/2009    Priority: Medium    Ingrown nail 07/24/2015    Priority: Low   Pronation deformity of both feet 05/22/2015    Priority: Low   Metatarsal deformity 05/08/2015    Priority: Low   Equinus deformity of foot, acquired 05/08/2015    Priority: Low   Onychomycosis 12/26/2014    Priority: Low   Tachycardia 02/14/2014    Priority: Low   Acne     Priority: Low   HYPOGONADISM 11/21/2009    Priority: Low   GERD 03/27/2008    Priority: Low   HEART MURMUR 03/27/2008    Priority: Low   Early satiety 01/29/2021   Bilateral knee pain 01/29/2021   Overweight 11/16/2018   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 100 strip 5   ampicillin (PRINCIPEN) 500 MG capsule Take 500 mg by mouth 2 (two) times daily.     cyanocobalamin 1000 MCG tablet Take 1,000 mcg by mouth daily.      gabapentin (NEURONTIN) 300 MG capsule Take 1 tablet twice  daily x 1 week, then increase to 1 tablet three times daily. 90 capsule 5   glimepiride (AMARYL) 2 MG tablet Take 1 tablet (2 mg total) by mouth daily with breakfast. 90 tablet 1   metFORMIN (GLUCOPHAGE-XR) 500 MG 24 hr tablet TAKE 4 TABLETS (2,000 MG TOTAL) BY MOUTH DAILY WITH BREAKFAST. (Patient taking differently: Take 1,000 mg by mouth in the morning and at bedtime.) 360 tablet 3   Olopatadine HCl 0.2 % SOLN INSTILL 1 DROP INTO BOTH EYES EVERY DAY     omeprazole (PRILOSEC) 40 MG capsule Take 1 capsule (40 mg total) by mouth daily. 60 capsule 2   rosuvastatin (CRESTOR) 5 MG tablet Take 1 tablet (5 mg total) by mouth once a week. 13 tablet 3   traMADol (ULTRAM) 50 MG tablet Take 1 tablet (50 mg total) by mouth every 12 (twelve) hours as needed. 20 tablet 0   No current facility-administered medications for this visit.     Objective:  BP 108/72   Pulse 86   Temp 97.9 F (36.6 C)   Ht '5\' 8"'$  (1.727 m)   Wt 170 lb 6.4 oz (77.3 kg)   SpO2 99%   BMI 25.91 kg/m  Gen: NAD, resting comfortably CV: RRR no murmurs rubs or gallops Lungs: CTAB no crackles, wheeze,  rhonchi Ext: no edema Skin: warm, dry  Diabetic Foot Exam - Simple   Simple Foot Form Diabetic Foot exam was performed with the following findings: Yes 04/22/2022  9:51 AM  Visual Inspection See comments: Yes Sensation Testing See comments: Yes Pulse Check Posterior Tibialis and Dorsalis pulse intact bilaterally: Yes Comments Slight callous buildup midfoot plantar side of foot laterally. Onychomycosis all toenails. Reduced monofilament testing- feels on toes but otherwise not on right foot- intermittently feels on left foot          Assessment and Plan   #Fatigue- reports some issues with this at least a year- check cbc, cmp, tsh, vitamin D- does not regularly snore but does some . Sleeps 8 hours a night but wakes groggy unrefreshed.  -stop bang score of 4 intermediate risk- we discussed if no findings on labs consider  sleep study with pulmonary  #onychomycosis all toenails- hard to trim nails and some callous on bottom of feet- he agrees to schedule podiatry follow up   #Neuropathy-ongoing work-up to Dr. Patel-currently on gabapentin 300 mg 3 times a day  (he takes twice)as a 04/22/2022-has had a EMG with plans for lumbar puncture to evaluate for immune mediated neuropathy  -some concern for alcohol related (0-2 per day- has cut down from higher levels)  or diabetes related -needs refill from tramadol   #Diabetes - followed with Dr. Cruzita Lederer in the past-A1c typically well controlled S:   metformin 2000 mg extended release daily, glimepiride 2 mg- advised could stop last visit due to low a1c- patient reports continued  -In the past also Jardiance 10 mg, invokana and januvia in the past.  CBGs- no lows Exercise and diet-  riding bike as hard with other exercise with neuropathy Lab Results  Component Value Date   HGBA1C 5.6 10/22/2021   HGBA1C 5.3 01/29/2021   HGBA1C 6.1 (A) 03/22/2019  A/P: diabetes has been well controlled- wonder if cutting down on alcohol really helped- we will update a1c and if still under 6 likely stop glimepiride  -he also thinks GI issues/decreased appetite are contributing - has not had DM eye exam so will schedule  #hyperlipidemia associated with diabetes S: Medication:Prescribed atorvastatin 40 mg once a week in the past- most recently rosuvastatin 5 mg once a week- he reports  taking this Lab Results  Component Value Date   CHOL 145 10/22/2021   HDL 73.60 10/22/2021   LDLCALC 51 10/22/2021   LDLDIRECT 57.0 06/08/2018   TRIG 99.0 10/22/2021   CHOLHDL 2 10/22/2021  A/P:  suspect stable as still on meds/controlled- continue current medication    # GERD/gaseous distension/gurling S:Medication:  on prilosec '40mg'$  (insurance wont pay for twice daily)- failed '20mg'$ . Does take b12 -History of early satiety at times but improved with higher dose- Some consideration of gastroparesis  related to diabetes- No dysphagia -trying to  #GI- working on getting records [facility]. We will have him sign ROI A/P: GERD seems  not ideally controlled- symptoms worsened when went down to once daily but insurance has not covered higher dose- but trying to get his records transferred over and get GI follow up- - he may try additional 20 mg at night by buying OTC   #hypertension S:  controlled without BP meds most recently BP Readings from Last 3 Encounters:  04/22/22 108/72  04/01/22 113/80  10/22/21 120/80  A/P: doing well without meds recently- continue to monitor  #Vitamin D deficiency S: Medication: Has required high-dose vitamin  D in the past- in early 2023 on 8-week dose- now taking none  Lab Results  Component Value Date   VD25OH 14 (L) 08/06/2021  A/P: update vitamin D- I am concerned could be low- suspect will need high dose repletion    #Tobacco abuse- 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  Secondary polycythemia as a result. Had been donating to [facility] regularly (has not been in lately)- got as low as 16.3 most recently- update CBC   #Testicular follow up- has not noted any changes- imaging as below. Will do clinical exam in february  "IMPRESSION:  1. Normal Doppler flow and size of both testicles. 2. Scrotal pearl corresponds to area of palpable concern in the left scrotum. Small left hydrocele. 3. Bilateral mild microlithiasis. Current literature suggests that testicular microlithiasis is not a significant independent risk factor for development of testicular carcinoma, and that follow up imaging is not warranted in the absence of other risk factors. Monthly testicular self-examination and annual physical exams are considered appropriate surveillance. If patient has other risk factors for testicular carcinoma, then referral to Urology should be considered. (Reference: DeCastro, et al.: A 5-Year Follow up Study of Asymptomatic Men with Testicular Microlithiasis. J  Urol 2008; [id number].)    Electronically Signed   By: Placido Sou M.D.   On: 02/28/2022 03:04"  Recommended follow up: Return in about 6 months (around 10/24/2022) for physical or sooner if needed.Schedule b4 you leave. Future Appointments  Date Time Provider [place]  06/03/2022  9:50 AM Posey Pronto, Arvin Collard K, DO [id number] None   Lab/Order associations:   ICD-10-CM   1. Type 2 diabetes mellitus with hyperglycemia, without long-term current use of insulin (HCC)  E11.65 Microalbumin / creatinine urine ratio    CBC with Differential/Platelet    Comprehensive metabolic panel    HgB F7J    2. Vitamin D deficiency  E55.9 VITAMIN D 25 Hydroxy (Vit-D Deficiency, Fractures)    3. Current smoker  F17.200 Ambulatory Referral for Lung Cancer Screening [REF832]    4. Tobacco abuse  Z72.0     5. Essential hypertension  I10     6. Hyperlipidemia, unspecified hyperlipidemia type  E78.5 CBC with Differential/Platelet    Comprehensive metabolic panel    TSH      Meds ordered this encounter  Medications   traMADol (ULTRAM) 50 MG tablet    Sig: Take 1 tablet (50 mg total) by mouth every 12 (twelve) hours as needed.    Dispense:  20 tablet    Refill:  0    Return precautions advised.  Garret Reddish, MD

## 2022-04-27 ENCOUNTER — Other Ambulatory Visit: Payer: Self-pay

## 2022-04-27 ENCOUNTER — Telehealth: Payer: Self-pay | Admitting: Family Medicine

## 2022-04-27 DIAGNOSIS — E875 Hyperkalemia: Secondary | ICD-10-CM

## 2022-04-27 NOTE — Telephone Encounter (Signed)
Pt states: -returning a call about results. -Should be available tomorrow 04/28/22  Pt requests: -call back

## 2022-04-30 ENCOUNTER — Telehealth: Payer: Self-pay

## 2022-04-30 ENCOUNTER — Encounter: Payer: Commercial Managed Care - HMO | Admitting: Neurology

## 2022-04-30 DIAGNOSIS — R270 Ataxia, unspecified: Secondary | ICD-10-CM

## 2022-04-30 NOTE — Telephone Encounter (Signed)
Please order CT head without contrast Dx: ataxia.  Pls let pt know that he needs to have this before they are able to schedule his LP.     Called patient and informed him that CT is needed prior to Lumbar. Patient is ok with this and is aware that someone from [facility] will give him a call.  Orders placed.

## 2022-04-30 NOTE — Telephone Encounter (Signed)
-----   Message from Alda Berthold, DO sent at 04/28/2022  2:15 PM EST ----- Noted.  Gabriel Hamilton, please order CT head without contrast Dx: ataxia.  Pls let pt know that he needs to have this before they are able to schedule his LP.   Thanks,  DP ----- Message ----- From: Gabriel Hamilton Sent: 04/28/2022  12:45 PM EST To: Alda Berthold, DO  Patient will need to have MRI or CT Head prior to scheduling L/P. Per pt no prev studies of head.

## 2022-05-11 ENCOUNTER — Other Ambulatory Visit (INDEPENDENT_AMBULATORY_CARE_PROVIDER_SITE_OTHER): Payer: Commercial Managed Care - HMO

## 2022-05-11 DIAGNOSIS — E875 Hyperkalemia: Secondary | ICD-10-CM

## 2022-05-11 NOTE — Telephone Encounter (Signed)
Gabriel Hamilton has called and spoke with pt.

## 2022-05-11 NOTE — Telephone Encounter (Signed)
Called and lm on pt vm tcb. 

## 2022-05-12 LAB — BASIC METABOLIC PANEL
BUN: 7 mg/dL (ref 6–23)
CO2: 30 mEq/L (ref 19–32)
Calcium: 9.1 mg/dL (ref 8.4–10.5)
Chloride: 99 mEq/L (ref 96–112)
Creatinine, Ser: 0.74 mg/dL (ref 0.40–1.50)
GFR: 104.07 mL/min (ref >60.00)
Glucose, Bld: 245 mg/dL — ABNORMAL HIGH (ref 70–99)
Potassium: 4.3 mEq/L (ref 3.5–5.1)
Sodium: 140 mEq/L (ref 135–145)

## 2022-05-18 ENCOUNTER — Telehealth: Payer: Self-pay | Admitting: Anesthesiology

## 2022-05-18 NOTE — Telephone Encounter (Signed)
[facility] number given to patient to give them a call schedule.

## 2022-05-18 NOTE — Telephone Encounter (Signed)
Pt left message with AN stating he has not heard back from [facility] to get his procedure scheduled. Requests call back.

## 2022-06-03 ENCOUNTER — Encounter: Payer: Self-pay | Admitting: Neurology

## 2022-06-03 ENCOUNTER — Ambulatory Visit: Payer: Commercial Managed Care - HMO | Admitting: Neurology

## 2022-06-03 VITALS — BP 117/73 | HR 96 | Ht 68.0 in | Wt 167.0 lb

## 2022-06-03 DIAGNOSIS — G621 Alcoholic polyneuropathy: Secondary | ICD-10-CM | POA: Diagnosis not present

## 2022-06-03 DIAGNOSIS — G610 Guillain-Barre syndrome: Secondary | ICD-10-CM

## 2022-06-03 DIAGNOSIS — E1142 Type 2 diabetes mellitus with diabetic polyneuropathy: Secondary | ICD-10-CM
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
## 2022-06-03 NOTE — Patient Instructions (Addendum)
Please call [facility] at [phone] to schedule your CT head and then lumbar puncture  Return to clinic in 3 months

## 2022-06-09 ENCOUNTER — Other Ambulatory Visit: Payer: Commercial Managed Care - HMO

## 2022-07-25 ENCOUNTER — Other Ambulatory Visit: Payer: Self-pay | Admitting: Neurology

## 2022-08-12 ENCOUNTER — Telehealth: Payer: Self-pay | Admitting: Neurology

## 2022-08-12 MED ORDER — GABAPENTIN 300 MG PO CAPS: ORAL_CAPSULE | ORAL | 5 refills | Status: DC

## 2022-08-12 NOTE — Telephone Encounter (Signed)
Refilled

## 2022-08-12 NOTE — Telephone Encounter (Signed)
1. Which medications need refilled? (List name and dosage, if known) Gabapentin  2. Which pharmacy/location is medication to be sent to? (include street and city if local pharmacy) [facility] [employer] on [address]

## 2022-09-01 ENCOUNTER — Encounter: Payer: Self-pay | Admitting: Neurology

## 2022-09-01 ENCOUNTER — Ambulatory Visit: Payer: BLUE CROSS/BLUE SHIELD | Admitting: Neurology

## 2022-09-01 ENCOUNTER — Telehealth: Payer: Self-pay | Admitting: Family Medicine

## 2022-09-01 VITALS — BP 130/78 | HR 81 | Ht 68.0 in | Wt 168.0 lb

## 2022-09-01 DIAGNOSIS — G621 Alcoholic polyneuropathy: Secondary | ICD-10-CM

## 2022-09-01 DIAGNOSIS — E1142 Type 2 diabetes mellitus with diabetic polyneuropathy: Secondary | ICD-10-CM | POA: Diagnosis not present

## 2022-09-01 MED ORDER — GLIMEPIRIDE 2 MG PO TABS: 2.0000 mg | ORAL_TABLET | Freq: Every day | ORAL | 1 refills | Status: DC

## 2022-09-01 NOTE — Telephone Encounter (Signed)
Pty states his medications have been sent to the wrong pharmacy, [id number] [place][[id number]

## 2022-09-01 NOTE — Patient Instructions (Signed)
Keep up the good work!  Return to clinic in 6 months

## 2022-09-01 NOTE — Telephone Encounter (Signed)
Please resend

## 2022-09-01 NOTE — Telephone Encounter (Signed)
Called and spoke with pt and pt was needing Glimepride filled, rx sent to requested pharmacy.
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
## 2022-11-09 ENCOUNTER — Ambulatory Visit: Payer: BLUE CROSS/BLUE SHIELD | Admitting: Family Medicine

## 2022-11-12 ENCOUNTER — Telehealth: Payer: Self-pay | Admitting: Family Medicine

## 2022-11-12 NOTE — Telephone Encounter (Signed)
His blood sugar was trending up on last 2 BMPs p-can we get him in within the next few weeks to discuss further.  In the short-term you can refill glimepiride 4 mg once daily #30 with no refills

## 2022-11-12 NOTE — Telephone Encounter (Signed)
Patient states he has been taking 2 tablet per day of  glimepiride (AMARYL) 2 MG tablet.  Pharmacy will fill RX because dosage instructions state take 1 tablet per day.  Patient requests to be advised via MyChart message for clarification of dosage - requests refill/new RX with dosage increase to 2 tablets per day because he is out of medication

## 2022-11-12 NOTE — Telephone Encounter (Signed)
See below.  From last ov note:  #Diabetes - followed with Dr. Elvera Lennox in the past-A1c typically well controlled S:   metformin 2000 mg extended release daily, glimepiride 2 mg- advised could stop last visit due to low a1c- patient reports continued

## 2022-11-13 MED ORDER — GLIMEPIRIDE 4 MG PO TABS: 4.0000 mg | ORAL_TABLET | Freq: Every day | ORAL | 0 refills | Status: DC

## 2022-11-13 NOTE — Addendum Note (Signed)
Addended by: Gwenette Greet on: 11/13/2022 08:03 AM   Modules accepted: Orders

## 2022-11-13 NOTE — Telephone Encounter (Signed)
Rx sent in for 30 days, please schedule f/u visit for diabetes to discuss further per Dr. Durene Cal.

## 2022-11-13 NOTE — Telephone Encounter (Signed)
Unable to reach pt , lvm for him to cb to schedule an appt .

## 2022-12-08 ENCOUNTER — Other Ambulatory Visit: Payer: Self-pay | Admitting: Family Medicine

## 2022-12-14 ENCOUNTER — Encounter: Payer: Self-pay | Admitting: Neurology

## 2022-12-17 ENCOUNTER — Other Ambulatory Visit: Payer: Self-pay | Admitting: Family Medicine

## 2023-01-04 ENCOUNTER — Other Ambulatory Visit: Payer: Self-pay | Admitting: Family Medicine

## 2023-01-11 ENCOUNTER — Ambulatory Visit: Payer: 59 | Admitting: Family Medicine

## 2023-01-11 VITALS — BP 128/78 | HR 78 | Temp 97.8°F | Ht 68.0 in | Wt 167.8 lb

## 2023-01-11 DIAGNOSIS — E785 Hyperlipidemia, unspecified: Secondary | ICD-10-CM

## 2023-01-11 DIAGNOSIS — Z79899 Other long term (current) drug therapy: Secondary | ICD-10-CM

## 2023-01-11 DIAGNOSIS — I10 Essential (primary) hypertension: Secondary | ICD-10-CM

## 2023-01-11 DIAGNOSIS — E1165 Type 2 diabetes mellitus with hyperglycemia: Secondary | ICD-10-CM | POA: Diagnosis not present

## 2023-01-11 DIAGNOSIS — L98491 Non-pressure chronic ulcer of skin of other sites limited to breakdown of skin: Secondary | ICD-10-CM

## 2023-01-11 DIAGNOSIS — L700 Acne vulgaris: Secondary | ICD-10-CM

## 2023-01-11 DIAGNOSIS — E559 Vitamin D deficiency, unspecified: Secondary | ICD-10-CM

## 2023-01-11 MED ORDER — METFORMIN HCL ER 500 MG PO TB24: 2000.0000 mg | ORAL_TABLET | Freq: Every day | ORAL | 3 refills | Status: DC

## 2023-01-11 MED ORDER — AMPICILLIN 500 MG PO CAPS: 500.0000 mg | ORAL_CAPSULE | Freq: Two times a day (BID) | ORAL | 0 refills | Status: DC

## 2023-01-11 MED ORDER — OMEPRAZOLE 40 MG PO CPDR: 40.0000 mg | DELAYED_RELEASE_CAPSULE | Freq: Every day | ORAL | 3 refills | Status: DC

## 2023-01-11 NOTE — Progress Notes (Signed)
Phone [phone] In person visit   Subjective:   Gabriel Hamilton is a 52 y.o. year old very pleasant male patient who presents for/with See problem oriented charting Chief Complaint  Patient presents with   shoulder and elbow pain    Pt c/o bilateral shoulder and elbow pain x1 month along.   bilateral knee pain    Pt c/o bilateral knee pain that's getting worse   Fatigue    Pt c/o fatigue that's getting worse   Diabetes    DEE requested.    Past Medical History-  Patient Active Problem List   Diagnosis Date Noted   Type 2 diabetes mellitus with hyperglycemia, without long-term current use of insulin (HCC) 12/18/2015    Priority: High   Tobacco abuse 12/22/2011    Priority: High   Vitamin D deficiency 04/22/2022    Priority: Medium    Hyperlipidemia, unspecified 06/06/2014    Priority: Medium    Polycythemia 08/26/2011    Priority: Medium    Essential hypertension 07/10/2009    Priority: Medium    Ingrown nail 07/24/2015    Priority: Low   Pronation deformity of both feet 05/22/2015    Priority: Low   Metatarsal deformity 05/08/2015    Priority: Low   Equinus deformity of foot, acquired 05/08/2015    Priority: Low   Onychomycosis 12/26/2014    Priority: Low   Tachycardia 02/14/2014    Priority: Low   Acne     Priority: Low   HYPOGONADISM 11/21/2009    Priority: Low   GERD 03/27/2008    Priority: Low   HEART MURMUR 03/27/2008    Priority: Low   Early satiety 01/29/2021   Bilateral knee pain 01/29/2021   Overweight 11/16/2018   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 100 strip 5   cyanocobalamin 1000 MCG tablet Take 1,000 mcg by mouth daily.     gabapentin (NEURONTIN) 300 MG capsule Take  one capsule twice a day. 60 capsule 5   glimepiride (AMARYL) 4 MG tablet TAKE 1 TABLET BY MOUTH EVERY DAY WITH BREAKFAST 90 tablet 1   rosuvastatin (CRESTOR) 5 MG tablet Take 1 tablet (5 mg total) by mouth once a week. 13 tablet 3   ampicillin (PRINCIPEN) 500 MG capsule Take 1 capsule (500 mg total) by mouth 2 (two) times daily. 60 capsule 0   metFORMIN (GLUCOPHAGE-XR) 500 MG 24 hr tablet Take 4 tablets (2,000 mg total) by mouth daily with breakfast. 360 tablet 3   omeprazole (PRILOSEC) 40 MG capsule Take 1 capsule (40 mg total) by mouth daily. 90 capsule 3   No current facility-administered medications for this visit.     Objective:  BP 128/78   Pulse 78   Temp 97.8 F (36.6 C)   Ht 5\' 8"  (1.727 m)   Wt 167 lb 12.8 oz (76.1 kg)   SpO2 98%   BMI 25.51 kg/m  Gen: NAD, resting comfortably CV: RRR no murmurs rubs or gallops Lungs: CTAB no crackles, wheeze, rhonchi Ext: no edema Skin: warm, dry Callous at end of toes 2 and 3 - possible shallow ulceration under 2 and 3  Axilla- under left axilla 3 areas that apear to be enlarged- one drains with palpation Mild prepatellar bursa  enlarement on right knee- rest of knee exam normal    Assessment and Plan   #acne - reports has been on long term ampicillin 500 mg twice daily- acne in axilla, groin, face, beard, face, scalp through [facility] - unfortunately they are not accepting his new insurance - he is looking for new doctor. I was open I do not have a lot of experience with ampicillin for this and I also wonder about hidradenitis suppurtiva- I did agree to 30 day prescription to give time to get back into drematology and settle down some of the areas -has one area in left axilla that drained some with palpation- 2 other areas noted- encouraged warm compresses  # Joint pain S:patient reports pain in both shoulders- that is improving now. Also for a month- For a while couldn't extend elbows and would click when extending- he reports the  extension improved. Wonders if lifting contributing -also notes pain in both knees that seems to be getting worse over in last efw weeks -was told not osteoarthritis- has also been seen by rheumatology in the past at least 3 years ago- I don't see records. 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  -also notes fatigue getting worse - wonders if mental or physical. Sleeping good days and bad days. Not much of a snorer A/P: Joint pain and fatigue without morning stiffness but pain worse in morning -ANA, rheumatoid factor, considered canceling appointment, crp normal 08/06/21. Sed rate only mildly high.  - update other labs for fatigue but not sure repeat labs for rheumatology perspective would be as helpful without morning stiffness or joint swelling.  -pain makes him sad at times- reports decreased interest at times. Denies depression. No SI.  -he is going to check with network to see who is covered for joint pain- likes Dr. August Saucer but mentioned not in network - doesn't tolerate Voltaren- dreams  #Diabetes - followed with Dr. Elvera Lennox in the past-A1c typically well controlled S:   metformin 2000 mg extended release daily, glimepiride 4 mg -In the past also Jardiance 10 mg, invokana and januvia in the past.  Lab Results  Component Value Date   HGBA1C 6.4 04/22/2022   HGBA1C 5.6 10/22/2021   HGBA1C 5.3 01/29/2021  A/P: hopefully stable- update a1c today. Continue current meds for now  - callous formation on tips of toe 2 and 3 on 3rd foot with possible shallow ulceration underneath- get podiatry input on this- refer today  #hyperlipidemia associated with diabetes S: Medication: currently rosovuastatin 5 mg weekly in past- he stopped -previously Prescribed atorvastatin 40 mg once a week in the past Lab Results  Component Value Date   CHOL 145  10/22/2021   HDL 73.60 10/22/2021   LDLCALC 51 10/22/2021   LDLDIRECT 57.0 06/08/2018   TRIG 99.0 10/22/2021   CHOLHDL 2 10/22/2021   A/P: update lipids and likely restart the rosuvastatin    # GERD S:Medication:  on prilosec 40mg - failed 20mg . Does take b12 -History of early satiety at times but improved with higher dose - Some consideration of gastroparesis related to diabetes - No dysphagia A/P: stable- continue current medicines     #hypertension S:  controlled without BP meds most recently-seem to improve years ago when started on Invokana and remained down and then came off BP Readings from Last 3 Encounters:  01/11/23 128/78  09/01/22 130/78  06/03/22 117/73  A/P: doing well without medications- continue to monitor   #Vitamin D deficiency S: Medication: Has required high-dose vitamin D in the past-currently in early 2023 on 8-week dose. Not taking any vitamin D  Last vitamin D Lab Results  Component Value Date   VD25OH 31.01 04/22/2022  A/P: hopefully stable- update vitamin D today. Continue without meds for now     #Tobacco abuse- 1/2 PPD typically if that  Secondary polycythemia as a result. Had been donating to [facility] regularly - got as low as 17.  Tried to donate plasma but they needed a1c.  Strongly encouraged quit smoking  #Neuropathy-ongoing work-up to Dr. Patel-currently on gabapentin 300 mg 2x  day as a 04/22/2022-has had a EMG with plans for lumbar punctureto evaluate for immune mediated neuropathy but later improved with reducing. Mostly max one a day on alcohol- encouraged to stick with that   Recommended follow up: Return in about 4 months (around 05/14/2023) for physical or sooner if needed.Schedule b4 you leave. Future Appointments  Date Time Provider Department Center  03/17/2023  7:50 AM Allena Katz, Roxana Hires K, DO [facility] None   Lab/Order associations:   ICD-10-CM   1. Type 2 diabetes mellitus with hyperglycemia, without long-term current use of insulin  (HCC)  E11.65 Hemoglobin A1c    Comprehensive metabolic panel    CBC with Differential/Platelet    Lipid panel    2. Essential hypertension  I10     3. Hyperlipidemia, unspecified hyperlipidemia type  E78.5 Comprehensive metabolic panel    CBC with Differential/Platelet    Lipid panel    TSH    4. Vitamin D deficiency  E55.9 VITAMIN D 25 Hydroxy (Vit-D Deficiency, Fractures)    5. Acne vulgaris  L70.0     6. High risk medication use  Z79.899 Vitamin B12    7. Callous ulcer, limited to breakdown of skin (HCC)  L98.491 Ambulatory referral to Podiatry      Meds ordered this encounter  Medications   ampicillin (PRINCIPEN) 500 MG capsule    Sig: Take 1 capsule (500 mg total) by mouth 2 (two) times daily.    Dispense:  60 capsule    Refill:  0   omeprazole (PRILOSEC) 40 MG capsule    Sig: Take 1 capsule (40 mg total) by mouth daily.    Dispense:  90 capsule    Refill:  3   metFORMIN (GLUCOPHAGE-XR) 500 MG 24 hr tablet    Sig: Take 4 tablets (2,000 mg total) by mouth daily with breakfast.    Dispense:  360 tablet    Refill:  3    Return precautions advised.  Tana Conch, MD

## 2023-01-11 NOTE — Patient Instructions (Addendum)
Let us know if you decide to get your [facility] or COVID vaccine.  Please stop by lab before you go If you have mychart- we will send your results within 3 business days of [place] receiving them.  If you do not have mychart- we will call you about results within 5 business days of [place] receiving them.  *please also note that you will see labs on mychart as soon as they post. I will later go in and write notes on them- will say "notes from Dr. Durene Cal"   We have placed a referral for you today to Dr. Logan Bores. In some cases you will see # listed below- you can call this if you have not heard within a week. If you do not see # listed- you should receive a mychart message or phone call within a week with the # to call. Reach out to [place] if you ar enot scheduled within 2 weeks  Ampicillin 30 day supply  Recommended follow up: Return in about 4 months (around 05/14/2023) for physical or sooner if needed.Schedule b4 you leave.

## 2023-01-12 LAB — CBC WITH DIFFERENTIAL/PLATELET
Basophils Absolute: 0.1 10*3/uL (ref 0.0–0.1)
Basophils Relative: 1.5 % (ref 0.0–3.0)
Eosinophils Absolute: 0.6 10*3/uL (ref 0.0–0.7)
Eosinophils Relative: 6.4 % — ABNORMAL HIGH (ref 0.0–5.0)
HCT: 49.8 % (ref 39.0–52.0)
Hemoglobin: 15.4 g/dL (ref 13.0–17.0)
Lymphocytes Relative: 19.8 % (ref 12.0–46.0)
Lymphs Abs: 1.7 10*3/uL (ref 0.7–4.0)
MCHC: 31 g/dL (ref 30.0–36.0)
MCV: 85.4 fl (ref 78.0–100.0)
Monocytes Absolute: 0.7 10*3/uL (ref 0.1–1.0)
Monocytes Relative: 7.8 % (ref 3.0–12.0)
Neutro Abs: 5.7 10*3/uL (ref 1.4–7.7)
Neutrophils Relative %: 64.5 % (ref 43.0–77.0)
Platelets: 326 10*3/uL (ref 150.0–400.0)
RBC: 5.83 Mil/uL — ABNORMAL HIGH (ref 4.22–5.81)
RDW: 19.3 % — ABNORMAL HIGH (ref 11.5–15.5)
WBC: 8.8 10*3/uL (ref 4.0–10.5)

## 2023-01-12 LAB — VITAMIN B12: Vitamin B-12: >1501 pg/mL — ABNORMAL HIGH (ref 211–911)

## 2023-01-12 LAB — COMPREHENSIVE METABOLIC PANEL
ALT: 10 U/L (ref 0–53)
AST: 19 U/L (ref 0–37)
Albumin: 3.6 g/dL (ref 3.5–5.2)
Alkaline Phosphatase: 86 U/L (ref 39–117)
BUN: 12 mg/dL (ref 6–23)
CO2: 28 mEq/L (ref 19–32)
Calcium: 9.2 mg/dL (ref 8.4–10.5)
Chloride: 98 mEq/L (ref 96–112)
Creatinine, Ser: 0.79 mg/dL (ref 0.40–1.50)
GFR: 101.56 mL/min (ref >60.00)
Glucose, Bld: 122 mg/dL — ABNORMAL HIGH (ref 70–99)
Potassium: 4.2 mEq/L (ref 3.5–5.1)
Sodium: 138 mEq/L (ref 135–145)
Total Bilirubin: 0.4 mg/dL (ref 0.2–1.2)
Total Protein: 7.1 g/dL (ref 6.0–8.3)

## 2023-01-12 LAB — LIPID PANEL
Cholesterol: 109 mg/dL (ref 0–200)
HDL: 58.1 mg/dL (ref >39.00)
LDL Cholesterol: 35 mg/dL (ref 0–99)
NonHDL: 50.7
Total CHOL/HDL Ratio: 2
Triglycerides: 81 mg/dL (ref 0.0–149.0)
VLDL: 16.2 mg/dL (ref 0.0–40.0)

## 2023-01-12 LAB — TSH: TSH: 0.27 u[IU]/mL — ABNORMAL LOW (ref 0.35–5.50)

## 2023-01-12 LAB — HM DIABETES EYE EXAM

## 2023-01-12 LAB — VITAMIN D 25 HYDROXY (VIT D DEFICIENCY, FRACTURES): VITD: 46.82 ng/mL (ref 30.00–100.00)

## 2023-01-12 LAB — HEMOGLOBIN A1C: Hgb A1c MFr Bld: 7.5 % — ABNORMAL HIGH (ref 4.6–6.5)

## 2023-01-13 ENCOUNTER — Telehealth: Payer: Self-pay | Admitting: Family Medicine

## 2023-01-13 ENCOUNTER — Other Ambulatory Visit: Payer: Self-pay

## 2023-01-13 DIAGNOSIS — R7989 Other specified abnormal findings of blood chemistry: Secondary | ICD-10-CM

## 2023-01-13 NOTE — Telephone Encounter (Signed)
Lvm requesting call back to get pt scheduled for lab visit in 3 weeks .

## 2023-01-21 ENCOUNTER — Ambulatory Visit: Payer: BLUE CROSS/BLUE SHIELD | Admitting: Family Medicine

## 2023-02-16 ENCOUNTER — Other Ambulatory Visit: Payer: Self-pay | Admitting: Family Medicine

## 2023-02-17 ENCOUNTER — Ambulatory Visit: Payer: 59 | Admitting: Podiatry

## 2023-02-24 ENCOUNTER — Other Ambulatory Visit: Payer: Self-pay | Admitting: Family Medicine

## 2023-02-24 ENCOUNTER — Other Ambulatory Visit: Payer: Self-pay | Admitting: Neurology

## 2023-03-02 ENCOUNTER — Other Ambulatory Visit: Payer: Self-pay | Admitting: Family Medicine

## 2023-03-08 ENCOUNTER — Ambulatory Visit: Payer: BLUE CROSS/BLUE SHIELD | Admitting: Neurology

## 2023-03-17 ENCOUNTER — Ambulatory Visit: Payer: BLUE CROSS/BLUE SHIELD | Admitting: Neurology

## 2023-03-30 ENCOUNTER — Other Ambulatory Visit: Payer: Self-pay | Admitting: Neurology

## 2023-03-31 ENCOUNTER — Other Ambulatory Visit: Payer: Self-pay | Admitting: Neurology

## 2023-04-14 ENCOUNTER — Ambulatory Visit: Payer: 59 | Admitting: Orthopedic Surgery

## 2023-04-29 LAB — OPHTHALMONOLOGY REPORT-SCANNED

## 2023-06-14 ENCOUNTER — Other Ambulatory Visit: Payer: Self-pay | Admitting: Family Medicine
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
## 2023-07-07 ENCOUNTER — Other Ambulatory Visit: Payer: BC Managed Care – PPO

## 2023-07-07 ENCOUNTER — Ambulatory Visit: Payer: BC Managed Care – PPO | Admitting: Neurology

## 2023-07-07 ENCOUNTER — Encounter: Payer: Self-pay | Admitting: Neurology

## 2023-07-07 VITALS — BP 127/79 | HR 87 | Ht 68.0 in | Wt 168.4 lb

## 2023-07-07 DIAGNOSIS — M2041 Other hammer toe(s) (acquired), right foot: Secondary | ICD-10-CM | POA: Diagnosis not present

## 2023-07-07 DIAGNOSIS — G621 Alcoholic polyneuropathy: Secondary | ICD-10-CM | POA: Diagnosis not present

## 2023-07-07 DIAGNOSIS — L851 Acquired keratosis [keratoderma] palmaris et plantaris: Secondary | ICD-10-CM | POA: Diagnosis not present

## 2023-07-07 DIAGNOSIS — R202 Paresthesia of skin: Secondary | ICD-10-CM | POA: Diagnosis not present

## 2023-07-07 DIAGNOSIS — E1142 Type 2 diabetes mellitus with diabetic polyneuropathy: Secondary | ICD-10-CM

## 2023-07-07 DIAGNOSIS — R2681 Unsteadiness on feet: Secondary | ICD-10-CM

## 2023-07-07 DIAGNOSIS — M792 Neuralgia and neuritis, unspecified: Secondary | ICD-10-CM | POA: Diagnosis not present

## 2023-07-07 DIAGNOSIS — E569 Vitamin deficiency, unspecified: Secondary | ICD-10-CM | POA: Diagnosis not present

## 2023-07-07 MED ORDER — GABAPENTIN 300 MG PO CAPS: ORAL_CAPSULE | ORAL | 3 refills | Status: AC

## 2023-07-07 NOTE — Patient Instructions (Addendum)
 Follow-up with podiatry   Check labs  Your provider has requested that you have labwork completed today. The lab is located on the Second floor at [address], within the [facility] office. When you get off the elevator, turn right and go in the [facility] [address]; the first brown door on the left.  Tell the ladies behind the desk that you are there for lab work. If you are not called within 15 minutes please check with the front desk.   Once you complete your labs you are free to go. You will receive a call or message via MyChart with your lab results.     CT head

## 2023-07-09 ENCOUNTER — Other Ambulatory Visit: Payer: Self-pay

## 2023-07-09 DIAGNOSIS — G610 Guillain-Barre syndrome: Secondary | ICD-10-CM

## 2023-07-09 DIAGNOSIS — R270 Ataxia, unspecified: Secondary | ICD-10-CM

## 2023-07-09 DIAGNOSIS — R2689 Other abnormalities of gait and mobility: Secondary | ICD-10-CM

## 2023-07-09 DIAGNOSIS — E1142 Type 2 diabetes mellitus with diabetic polyneuropathy: Secondary | ICD-10-CM

## 2023-07-09 DIAGNOSIS — G63 Polyneuropathy in diseases classified elsewhere: Secondary | ICD-10-CM

## 2023-07-09 DIAGNOSIS — R2681 Unsteadiness on feet: Secondary | ICD-10-CM

## 2023-07-09 DIAGNOSIS — G621 Alcoholic polyneuropathy: Secondary | ICD-10-CM

## 2023-07-12 LAB — IMMUNOFIXATION ELECTROPHORESIS
IgG (Immunoglobin G), Serum: 1324 mg/dL (ref 600–1640)
IgM, Serum: 136 mg/dL (ref 50–300)
Immunoglobulin A: 284 mg/dL (ref 47–310)

## 2023-07-12 LAB — VITAMIN B1: Vitamin B1 (Thiamine): 6 nmol/L — ABNORMAL LOW (ref 8–30)

## 2023-07-12 LAB — VITAMIN B12: Vitamin B-12: 710 pg/mL (ref 200–1100)

## 2023-07-12 LAB — FOLATE: Folate: 18.3 ng/mL

## 2023-08-19 ENCOUNTER — Other Ambulatory Visit: Payer: Self-pay

## 2023-08-19 ENCOUNTER — Encounter: Payer: Self-pay | Admitting: Neurology

## 2023-08-19 DIAGNOSIS — R270 Ataxia, unspecified: Secondary | ICD-10-CM

## 2023-08-19 DIAGNOSIS — D8989 Other specified disorders involving the immune mechanism, not elsewhere classified: Secondary | ICD-10-CM

## 2023-08-19 DIAGNOSIS — R2681 Unsteadiness on feet: Secondary | ICD-10-CM

## 2023-08-19 DIAGNOSIS — R2689 Other abnormalities of gait and mobility: Secondary | ICD-10-CM

## 2023-08-19 DIAGNOSIS — G610 Guillain-Barre syndrome: Secondary | ICD-10-CM

## 2023-08-25 ENCOUNTER — Other Ambulatory Visit: Payer: BC Managed Care – PPO

## 2023-08-26 ENCOUNTER — Other Ambulatory Visit: Payer: Self-pay

## 2023-08-26 DIAGNOSIS — R2681 Unsteadiness on feet: Secondary | ICD-10-CM

## 2023-08-26 DIAGNOSIS — R2689 Other abnormalities of gait and mobility: Secondary | ICD-10-CM

## 2023-08-26 DIAGNOSIS — G610 Guillain-Barre syndrome: Secondary | ICD-10-CM

## 2023-08-26 DIAGNOSIS — D8989 Other specified disorders involving the immune mechanism, not elsewhere classified: Secondary | ICD-10-CM

## 2023-08-26 DIAGNOSIS — R270 Ataxia, unspecified: Secondary | ICD-10-CM

## 2023-08-27 ENCOUNTER — Telehealth: Payer: Self-pay

## 2023-08-27 ENCOUNTER — Other Ambulatory Visit: Payer: BC Managed Care – PPO

## 2023-08-27 NOTE — Telephone Encounter (Signed)
 Called patient and informed him that he is ready to be scheduled for his Lumbar Puncture. No PA required per his insurance. See media for determination. Patient requested I send central scheduling phone number to his MyChart. . Message sent

## 2023-08-30 ENCOUNTER — Inpatient Hospital Stay: Admission: RE | Admit: 2023-08-30 | Payer: BC Managed Care – PPO | Source: Ambulatory Visit

## 2023-08-31 ENCOUNTER — Ambulatory Visit (INDEPENDENT_AMBULATORY_CARE_PROVIDER_SITE_OTHER): Payer: 59 | Admitting: Family Medicine

## 2023-08-31 ENCOUNTER — Encounter: Payer: Self-pay | Admitting: Family Medicine

## 2023-08-31 VITALS — BP 122/70 | HR 79 | Temp 97.6°F | Ht 68.0 in | Wt 171.6 lb

## 2023-08-31 DIAGNOSIS — Z0000 Encounter for general adult medical examination without abnormal findings: Secondary | ICD-10-CM

## 2023-08-31 DIAGNOSIS — Z79899 Other long term (current) drug therapy: Secondary | ICD-10-CM | POA: Diagnosis not present

## 2023-08-31 DIAGNOSIS — Z720 Tobacco use: Secondary | ICD-10-CM | POA: Diagnosis not present

## 2023-08-31 DIAGNOSIS — E559 Vitamin D deficiency, unspecified: Secondary | ICD-10-CM

## 2023-08-31 DIAGNOSIS — I10 Essential (primary) hypertension: Secondary | ICD-10-CM

## 2023-08-31 DIAGNOSIS — Z125 Encounter for screening for malignant neoplasm of prostate: Secondary | ICD-10-CM | POA: Diagnosis not present

## 2023-08-31 DIAGNOSIS — E785 Hyperlipidemia, unspecified: Secondary | ICD-10-CM

## 2023-08-31 DIAGNOSIS — E1165 Type 2 diabetes mellitus with hyperglycemia: Secondary | ICD-10-CM | POA: Diagnosis not present

## 2023-08-31 DIAGNOSIS — Z7984 Long term (current) use of oral hypoglycemic drugs: Secondary | ICD-10-CM

## 2023-08-31 NOTE — Progress Notes (Signed)
 Phone: [phone]   Subjective:  Patient presents today for their annual physical. Chief complaint-noted.   See problem oriented charting- ROS- full  review of systems was completed and negative  except for: joint pain in knees in particular, acne, thickened toenails  The following were reviewed and entered/updated in epic: Past Medical History:  Diagnosis Date   Acne    Allergy 2005   Arthritis    Diabetes mellitus    type 2   GERD (gastroesophageal reflux disease)    Heart murmur    Hypertension    Patient Active Problem List   Diagnosis Date Noted   Type 2 diabetes mellitus with hyperglycemia, without long-term current use of insulin (HCC) 12/18/2015    Priority: High   Tobacco abuse 12/22/2011    Priority: High   Vitamin D deficiency 04/22/2022    Priority: Medium    Hyperlipidemia, unspecified 06/06/2014    Priority: Medium    Polycythemia 08/26/2011    Priority: Medium    Essential hypertension 07/10/2009    Priority: Medium    Ingrown nail 07/24/2015    Priority: Low   Pronation deformity of both feet 05/22/2015    Priority: Low   Metatarsal deformity 05/08/2015    Priority: Low   Equinus deformity of foot, acquired 05/08/2015    Priority: Low   Onychomycosis 12/26/2014    Priority: Low   Tachycardia 02/14/2014    Priority: Low   Acne     Priority: Low   HYPOGONADISM 11/21/2009    Priority: Low   GERD 03/27/2008    Priority: Low   HEART MURMUR 03/27/2008    Priority: Low   Early satiety 01/29/2021   Bilateral knee pain 01/29/2021   Overweight 11/16/2018   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   Past Surgical History:  Procedure Laterality Date   CATARACT EXTRACTION Bilateral    in 2023   EYE SURGERY      Family History  Problem Relation Age of Onset   Arthritis Mother    Cancer Mother        optic nerve   Stroke Father    Diabetes Father    Heart disease Father    Hyperlipidemia Father    Hypertension Father     Arthritis Father     Medications- reviewed and updated Current Outpatient Medications  Medication Sig Dispense Refill   Accu-Chek FastClix Lancets MISC USE TO CHECK BLOOD SUGAR TWICE DAILY 204 each 0   ACCU-CHEK GUIDE test strip USE AS INSTRUCTED TO CHECK SUGAR TWICE DAILY. 100 strip 5   cyanocobalamin 1000 MCG tablet Take 1,000 mcg by mouth daily.     gabapentin (NEURONTIN) 300 MG capsule TAKE 1 CAPSULE BY MOUTH TWICE A DAY 180 capsule 3   glimepiride (AMARYL) 2 MG tablet TAKE 1 TABLET BY MOUTH DAILY WITH BREAKFAST 90 tablet 1   metFORMIN (GLUCOPHAGE-XR) 500 MG 24 hr tablet Take 4 tablets (2,000 mg total) by mouth daily with breakfast. 360 tablet 3   omeprazole (PRILOSEC) 40 MG capsule Take 1 capsule (40 mg total) by mouth daily. 90 capsule 3   No current facility-administered medications for this visit.    Allergies-reviewed and updated Allergies  Allergen Reactions   Benazepril     May have caused angioedema    Social History   Social History Narrative   Married around 2007. Wife with 2 sons from previous marriage. 3 grandsons. 1 niece. Close family.       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 Oropharynx normal Neck: no thyromegaly CV: RRR no murmurs rubs or gallops Lungs: CTAB no crackles, wheeze, rhonchi Abdomen: soft/nontender/nondistended/normal bowel sounds. No rebound or guarding.  Ext: no edema Skin: warm, dry Neuro: grossly normal, moves all extremities, PERRLA Declines genitourinary and rectal exam   Diabetic Foot Exam - Simple    Simple Foot Form Diabetic Foot exam was performed with the following findings: Yes 08/31/2023  2:28 PM  Visual Inspection See comments: Yes Sensation Testing See comments: Yes Pulse Check Posterior Tibialis and Dorsalis pulse intact bilaterally: Yes Comments Callous formation on tip of 2nd toe on right foot. Onychomycosis multiple toenails right and left foot.         Assessment and Plan  53 y.o. male presenting for annual physical.  Health Maintenance counseling: 1. Anticipatory guidance: Patient counseled regarding regular dental exams - new dentist soon- planned q6 months, eye exams - yearly,  avoiding smoking and second hand smoke- see below , limiting alcohol to 2 beverages per day - 2 alcoholic beverages per day- with neuropathy has to cut out, no illicit drugs .   2. Risk factor reduction:  Advised patient of need for regular exercise and diet rich and fruits and vegetables to reduce risk of heart attack and stroke.  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  Diet/weight management-up 4 lbs- mild weight loss would be good- discussed dietary chagnes.  Wt Readings from Last 3 Encounters:  08/31/23 171 lb 9.6 oz (77.8 kg)  07/07/23 168 lb 6.4 oz (76.4 kg)  01/11/23 167 lb 12.8 oz (76.1 kg)  3. Immunizations/screenings/ancillary studies-declines flu, Shingrix, pneumonia shot. Avoid COVID in particular with possible cidp  Immunization History  Administered Date(s) Administered   PFIZER(Purple Top)SARS-COV-2 Vaccination 09/30/2019, 10/25/2019   Pneumococcal Polysaccharide-23 06/06/2008, 02/14/2014   Td 03/22/2008   Tdap 06/08/2018   4. Prostate cancer screening- low risk prior trend- update psa today   Lab Results  Component Value Date   PSA 0.78 10/22/2021   PSA 0.75 12/14/2018   PSA 0.82 06/09/2017   5. Colon cancer screening - 09/20/19 with Dr. Vonita [facility].  6. Skin cancer screening- lower risk due to melanin content - but working with dermatology soon for  acne. advised regular sunscreen use. Denies worrisome, changing, or new skin lesions.  7. Smoking associated screening (lung cancer screening, AAA screen 65-75, UA)- current smoker- 1 ppd over 30 pack years. discussed lung cancer screening- refer today , check urinalysis . Advise quitting smoking.  8. STD screening - only active with wife  Status of chronic or acute concerns   #Diabetes - followed with Dr. Elvera Lennox in the past-A1c typically well controlled S:   metformin 2000 mg extended release daily, glimepiride 2 mg- was running out last time -In the past also Jardiance 10 mg, invokana and januvia in the past.  -lowest he got was 72 Lab Results  Component Value Date   HGBA1C  7.5 (H) 01/11/2023   HGBA1C 6.4 04/22/2022   HGBA1C 5.6 10/22/2021   A/P: hoping a1c improved- if not may need to refer back or adjust- hed be open to 2 glimepiride a day- no lows at home (main risk)  #hyperlipidemia associated with diabetes S: Medication:None  -He stopped rosovuastatin 5 mg weekly -previously Prescribed atorvastatin 40 mg once a week in the past Lab Results  Component Value Date   CHOL 109 01/11/2023   HDL 58.10 01/11/2023   LDLCALC 35 01/11/2023   LDLDIRECT 57.0 06/08/2018   TRIG 81.0 01/11/2023   CHOLHDL 2 01/11/2023   A/P: update lipids- he prefers to stay off unless LDL over 70    # GERD S:Medication:  on prilosec 40mg - failed 20mg . Does take B12 and b1 A/P: reasonable control- continue current medications     #hypertension- remains good without medicine- continue to monitor - thinks may have bene overdiagnosed with this  #Vitamin D deficiency S: Medication: Has required high-dose vitamin D in the past-vitamin D off right now Last vitamin D Lab Results  Component Value Date   VD25OH 46.82 01/11/2023  A/P: hopefully stable- update vitamin D today. Remain off medications for now  #Tobacco abuse- 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  Secondary polycythemia as a result. Update  today  #Neuropathy-ongoing work-up to Dr. 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  CRP normal.  He was considering seeing Dr. August Saucer but was not in network, bad dreams on Voltaren so prescription for NSAIDs not ideal . ANA and risk factors were negative. Has seen rheumatology and not helpful -worse with working in wearehouse -declines repeat rheumatology labs -discussed tylenol arthritis as an adjunct to Voltaren gel and sparing naproxen  #onychomycosis- treated 2016 and 2020 by podiatry but not sure this was helpful. Working closely with podiatry and he reports was told only callous without ulcer on right foot  Recommended follow up: Return in about 4 months (around 12/31/2023) for followup or sooner if needed.Schedule b4 you leave.  Lab/Order associations:NOT fasting   ICD-10-CM   1. Preventative health care  Z00.00     2. Essential hypertension  I10     3. Hyperlipidemia, unspecified hyperlipidemia type  E78.5 Comprehensive metabolic panel    CBC with Differential/Platelet    Lipid panel    4. Vitamin D deficiency  E55.9 VITAMIN D 25 Hydroxy (Vit-D Deficiency, Fractures)    5. Type 2 diabetes mellitus with hyperglycemia, without long-term current use of insulin (HCC)  E11.65 Hemoglobin A1c    Urine Microalbumin w/creat.  ratio    Comprehensive metabolic panel    CBC with Differential/Platelet    6. Tobacco abuse  Z72.0 Urinalysis, Routine w reflex microscopic    Ambulatory Referral for Lung Cancer Scre    7. High risk medication use  Z79.899 Vitamin B12    8. Screening for prostate cancer  Z12.5 PSA  No orders of the defined types were placed in this encounter.   Return precautions advised.  Tana Conch, MD

## 2023-08-31 NOTE — Patient Instructions (Addendum)
 Lets try to cut out the alcohol as could worsen or be the cause of the neuropathy  We have placed a referral for you today to lung cancer screening- please call their # if you do not hear within a week (may be listed below or you may see mychart message within a few days with #).   As always quitting smoking is best idea for long term health  Voltaren gel is good choice  Please stop by lab before you go If you have mychart- we will send your results within 3 business days of [place] receiving them.  If you do not have mychart- we will call you about results within 5 business days of [place] receiving them.  *please also note that you will see labs on mychart as soon as they post. I will later go in and write notes on them- will say "notes from Dr. Durene Cal"   Recommended follow up: Return in about 4 months (around 12/31/2023) for followup or sooner if needed.Schedule b4 you leave.

## 2023-09-01 DIAGNOSIS — M2041 Other hammer toe(s) (acquired), right foot: Secondary | ICD-10-CM | POA: Diagnosis not present

## 2023-09-01 DIAGNOSIS — L97522 Non-pressure chronic ulcer of other part of left foot with fat layer exposed: Secondary | ICD-10-CM | POA: Diagnosis not present

## 2023-09-01 DIAGNOSIS — M792 Neuralgia and neuritis, unspecified: Secondary | ICD-10-CM | POA: Diagnosis not present

## 2023-09-01 LAB — URINALYSIS, ROUTINE W REFLEX MICROSCOPIC
Bilirubin Urine: NEGATIVE
Hgb urine dipstick: NEGATIVE
Leukocytes,Ua: NEGATIVE
Nitrite: NEGATIVE
RBC / HPF: NONE SEEN (ref 0–?)
Specific Gravity, Urine: >=1.03 — AB (ref 1.000–1.030)
Total Protein, Urine: NEGATIVE
Urine Glucose: NEGATIVE
Urobilinogen, UA: 0.2 (ref 0.0–1.0)
pH: 6 (ref 5.0–8.0)

## 2023-09-01 LAB — MICROALBUMIN / CREATININE URINE RATIO
Creatinine,U: 193.6 mg/dL
Microalb Creat Ratio: 10.4 mg/g (ref 0.0–30.0)
Microalb, Ur: 2 mg/dL — ABNORMAL HIGH (ref 0.0–1.9)

## 2023-09-01 LAB — LIPID PANEL
Cholesterol: 148 mg/dL (ref 0–200)
HDL: 65.6 mg/dL (ref >39.00)
LDL Cholesterol: 41 mg/dL (ref 0–99)
NonHDL: 82.82
Total CHOL/HDL Ratio: 2
Triglycerides: 211 mg/dL — ABNORMAL HIGH (ref 0.0–149.0)
VLDL: 42.2 mg/dL — ABNORMAL HIGH (ref 0.0–40.0)

## 2023-09-01 LAB — CBC WITH DIFFERENTIAL/PLATELET
Basophils Absolute: 0.1 10*3/uL (ref 0.0–0.1)
Basophils Relative: 1.4 % (ref 0.0–3.0)
Eosinophils Absolute: 0.7 10*3/uL (ref 0.0–0.7)
Eosinophils Relative: 9.6 % — ABNORMAL HIGH (ref 0.0–5.0)
HCT: 49.4 % (ref 39.0–52.0)
Hemoglobin: 15.5 g/dL (ref 13.0–17.0)
Lymphocytes Relative: 24.9 % (ref 12.0–46.0)
Lymphs Abs: 1.9 10*3/uL (ref 0.7–4.0)
MCHC: 31.5 g/dL (ref 30.0–36.0)
MCV: 88.3 fl (ref 78.0–100.0)
Monocytes Absolute: 0.5 10*3/uL (ref 0.1–1.0)
Monocytes Relative: 6.2 % (ref 3.0–12.0)
Neutro Abs: 4.5 10*3/uL (ref 1.4–7.7)
Neutrophils Relative %: 57.9 % (ref 43.0–77.0)
Platelets: 279 10*3/uL (ref 150.0–400.0)
RBC: 5.59 Mil/uL (ref 4.22–5.81)
RDW: 20.3 % — ABNORMAL HIGH (ref 11.5–15.5)
WBC: 7.8 10*3/uL (ref 4.0–10.5)

## 2023-09-01 LAB — COMPREHENSIVE METABOLIC PANEL
ALT: 11 U/L (ref 0–53)
AST: 18 U/L (ref 0–37)
Albumin: 4.2 g/dL (ref 3.5–5.2)
Alkaline Phosphatase: 109 U/L (ref 39–117)
BUN: 13 mg/dL (ref 6–23)
CO2: 31 meq/L (ref 19–32)
Calcium: 10.1 mg/dL (ref 8.4–10.5)
Chloride: 99 meq/L (ref 96–112)
Creatinine, Ser: 1.04 mg/dL (ref 0.40–1.50)
GFR: 81.72 mL/min (ref >60.00)
Glucose, Bld: 119 mg/dL — ABNORMAL HIGH (ref 70–99)
Potassium: 4.5 meq/L (ref 3.5–5.1)
Sodium: 139 meq/L (ref 135–145)
Total Bilirubin: 0.4 mg/dL (ref 0.2–1.2)
Total Protein: 7.5 g/dL (ref 6.0–8.3)

## 2023-09-01 LAB — PSA: PSA: 0.84 ng/mL (ref 0.10–4.00)

## 2023-09-01 LAB — HEMOGLOBIN A1C: Hgb A1c MFr Bld: 7.6 % — ABNORMAL HIGH (ref 4.6–6.5)

## 2023-09-01 LAB — VITAMIN B12: Vitamin B-12: 557 pg/mL (ref 211–911)

## 2023-09-01 LAB — VITAMIN D 25 HYDROXY (VIT D DEFICIENCY, FRACTURES): VITD: 39.26 ng/mL (ref 30.00–100.00)

## 2023-09-02 ENCOUNTER — Encounter: Payer: Self-pay | Admitting: Family Medicine

## 2023-09-03 ENCOUNTER — Other Ambulatory Visit: Payer: Self-pay

## 2023-09-03 MED ORDER — GLIMEPIRIDE 4 MG PO TABS: 4.0000 mg | ORAL_TABLET | Freq: Every day | ORAL | 3 refills | Status: AC

## 2023-09-06 ENCOUNTER — Other Ambulatory Visit: Payer: BC Managed Care – PPO

## 2023-09-09 DIAGNOSIS — L732 Hidradenitis suppurativa: Secondary | ICD-10-CM | POA: Diagnosis not present

## 2023-09-09 DIAGNOSIS — L818 Other specified disorders of pigmentation: Secondary | ICD-10-CM | POA: Diagnosis not present

## 2023-09-09 DIAGNOSIS — L308 Other specified dermatitis: Secondary | ICD-10-CM | POA: Diagnosis not present

## 2023-09-23 ENCOUNTER — Other Ambulatory Visit: Payer: Self-pay | Admitting: Family Medicine

## 2023-10-04 DIAGNOSIS — L97512 Non-pressure chronic ulcer of other part of right foot with fat layer exposed: Secondary | ICD-10-CM | POA: Diagnosis not present

## 2023-10-07 DIAGNOSIS — B9689 Other specified bacterial agents as the cause of diseases classified elsewhere: Secondary | ICD-10-CM | POA: Diagnosis not present

## 2023-10-07 DIAGNOSIS — L02821 Furuncle of head [any part, except face]: Secondary | ICD-10-CM | POA: Diagnosis not present

## 2023-10-07 DIAGNOSIS — L732 Hidradenitis suppurativa: Secondary | ICD-10-CM | POA: Diagnosis not present

## 2023-11-03 DIAGNOSIS — M792 Neuralgia and neuritis, unspecified: Secondary | ICD-10-CM | POA: Diagnosis not present

## 2023-11-03 DIAGNOSIS — M2041 Other hammer toe(s) (acquired), right foot: Secondary | ICD-10-CM | POA: Diagnosis not present

## 2023-11-03 DIAGNOSIS — M86471 Chronic osteomyelitis with draining sinus, right ankle and foot: Secondary | ICD-10-CM | POA: Diagnosis not present

## 2023-12-22 DIAGNOSIS — M792 Neuralgia and neuritis, unspecified: Secondary | ICD-10-CM | POA: Diagnosis not present

## 2024-01-10 ENCOUNTER — Other Ambulatory Visit: Payer: Self-pay | Admitting: Family Medicine

## 2024-01-11 DIAGNOSIS — H43811 Vitreous degeneration, right eye: Secondary | ICD-10-CM | POA: Diagnosis not present

## 2024-01-20 DIAGNOSIS — M2041 Other hammer toe(s) (acquired), right foot: Secondary | ICD-10-CM | POA: Diagnosis not present

## 2024-02-04 ENCOUNTER — Other Ambulatory Visit: Payer: Self-pay | Admitting: Family Medicine

## 2024-03-14 DIAGNOSIS — H18891 Other specified disorders of cornea, right eye: Secondary | ICD-10-CM | POA: Diagnosis not present

## 2024-03-28 DIAGNOSIS — E1142 Type 2 diabetes mellitus with diabetic polyneuropathy: Secondary | ICD-10-CM | POA: Diagnosis not present

## 2024-04-13 DIAGNOSIS — L0202 Furuncle of face: Secondary | ICD-10-CM | POA: Diagnosis not present

## 2024-05-11 LAB — OPHTHALMOLOGY REPORT-SCANNED

## 2024-07-08 ENCOUNTER — Other Ambulatory Visit: Payer: Self-pay | Admitting: Neurology
